# Patient Record
Sex: Female | Born: 1945 | Race: White | Hispanic: No | Marital: Married | Smoking: Former smoker
Health system: Southern US, Community
[De-identification: ages and names within clinical notes are randomized; demographics above are authoritative.]

## PROBLEM LIST (undated history)

## (undated) DIAGNOSIS — E785 Hyperlipidemia, unspecified: Secondary | ICD-10-CM

## (undated) DIAGNOSIS — C3412 Malignant neoplasm of upper lobe, left bronchus or lung: Secondary | ICD-10-CM

## (undated) DIAGNOSIS — J449 Chronic obstructive pulmonary disease, unspecified: Secondary | ICD-10-CM

## (undated) DIAGNOSIS — N2889 Other specified disorders of kidney and ureter: Secondary | ICD-10-CM

## (undated) DIAGNOSIS — Z85118 Personal history of other malignant neoplasm of bronchus and lung: Secondary | ICD-10-CM

## (undated) DIAGNOSIS — N182 Chronic kidney disease, stage 2 (mild): Secondary | ICD-10-CM

## (undated) HISTORY — DX: Chronic obstructive pulmonary disease, unspecified: J44.9

## (undated) HISTORY — DX: Hyperlipidemia, unspecified: E78.5

## (undated) HISTORY — DX: Malignant neoplasm of upper lobe, left bronchus or lung: C34.12

## (undated) HISTORY — DX: Other specified disorders of kidney and ureter: N28.89

## (undated) HISTORY — DX: Personal history of other malignant neoplasm of bronchus and lung: Z85.118

## (undated) HISTORY — DX: Chronic kidney disease, stage 2 (mild): N18.2

## (undated) HISTORY — PX: TUBAL LIGATION: SHX77

---

## 2006-05-15 DIAGNOSIS — Z85118 Personal history of other malignant neoplasm of bronchus and lung: Secondary | ICD-10-CM

## 2006-05-15 HISTORY — DX: Personal history of other malignant neoplasm of bronchus and lung: Z85.118

## 2006-05-15 HISTORY — PX: MEDIASTINOSCOPY: SHX5086

## 2007-02-13 ENCOUNTER — Ambulatory Visit (HOSPITAL_COMMUNITY): Admission: RE | Admit: 2007-02-13 | Discharge: 2007-02-13 | Payer: Self-pay | Admitting: Family Medicine

## 2007-02-14 ENCOUNTER — Ambulatory Visit (HOSPITAL_COMMUNITY): Admission: RE | Admit: 2007-02-14 | Discharge: 2007-02-14 | Payer: Self-pay | Admitting: Family Medicine

## 2007-02-21 ENCOUNTER — Ambulatory Visit: Payer: Self-pay | Admitting: Thoracic Surgery

## 2007-02-25 ENCOUNTER — Encounter: Payer: Self-pay | Admitting: Thoracic Surgery

## 2007-02-26 ENCOUNTER — Ambulatory Visit: Payer: Self-pay | Admitting: Thoracic Surgery

## 2007-02-26 ENCOUNTER — Ambulatory Visit: Payer: Self-pay | Admitting: Pulmonary Disease

## 2007-02-26 ENCOUNTER — Inpatient Hospital Stay (HOSPITAL_COMMUNITY): Admission: RE | Admit: 2007-02-26 | Discharge: 2007-03-01 | Payer: Self-pay | Admitting: Thoracic Surgery

## 2007-02-26 ENCOUNTER — Ambulatory Visit: Payer: Self-pay | Admitting: Internal Medicine

## 2007-02-27 ENCOUNTER — Encounter: Payer: Self-pay | Admitting: Thoracic Surgery

## 2007-02-28 ENCOUNTER — Ambulatory Visit: Payer: Self-pay | Admitting: Internal Medicine

## 2007-03-04 ENCOUNTER — Ambulatory Visit: Admission: RE | Admit: 2007-03-04 | Discharge: 2007-05-07 | Payer: Self-pay | Admitting: Radiation Oncology

## 2007-03-04 LAB — COMPREHENSIVE METABOLIC PANEL
Albumin: 4.1 g/dL (ref 3.5–5.2)
Alkaline Phosphatase: 73 U/L (ref 39–117)
BUN: 15 mg/dL (ref 6–23)
Calcium: 10.1 mg/dL (ref 8.4–10.5)
Glucose, Bld: 95 mg/dL (ref 70–99)
Potassium: 4.6 mEq/L (ref 3.5–5.3)

## 2007-03-04 LAB — CBC WITH DIFFERENTIAL/PLATELET
Basophils Absolute: 0 10*3/uL (ref 0.0–0.1)
Eosinophils Absolute: 0 10*3/uL (ref 0.0–0.5)
HCT: 44 % (ref 34.8–46.6)
HGB: 15.8 g/dL (ref 11.6–15.9)
MCV: 87.8 fL (ref 81.0–101.0)
MONO%: 5.3 % (ref 0.0–13.0)
NEUT#: 10.4 10*3/uL — ABNORMAL HIGH (ref 1.5–6.5)
Platelets: 242 10*3/uL (ref 145–400)
RDW: 10.8 % — ABNORMAL LOW (ref 11.3–14.5)

## 2007-03-11 LAB — COMPREHENSIVE METABOLIC PANEL
Albumin: 3.9 g/dL (ref 3.5–5.2)
BUN: 18 mg/dL (ref 6–23)
CO2: 21 mEq/L (ref 19–32)
Calcium: 9.1 mg/dL (ref 8.4–10.5)
Chloride: 100 mEq/L (ref 96–112)
Creatinine, Ser: 0.75 mg/dL (ref 0.40–1.20)
Glucose, Bld: 102 mg/dL — ABNORMAL HIGH (ref 70–99)
Potassium: 4.3 mEq/L (ref 3.5–5.3)

## 2007-03-11 LAB — CBC WITH DIFFERENTIAL/PLATELET
Basophils Absolute: 0 10*3/uL (ref 0.0–0.1)
Eosinophils Absolute: 0 10*3/uL (ref 0.0–0.5)
MCH: 31.1 pg (ref 26.0–34.0)
MCV: 87 fL (ref 81.0–101.0)
NEUT%: 91.6 % — ABNORMAL HIGH (ref 39.6–76.8)
Platelets: 220 10*3/uL (ref 145–400)
RDW: 10.9 % — ABNORMAL LOW (ref 11.3–14.5)
WBC: 7.2 10*3/uL (ref 3.9–10.0)
lymph#: 0.2 10*3/uL — ABNORMAL LOW (ref 0.9–3.3)

## 2007-03-18 LAB — COMPREHENSIVE METABOLIC PANEL
AST: 29 U/L (ref 0–37)
Albumin: 3.6 g/dL (ref 3.5–5.2)
BUN: 16 mg/dL (ref 6–23)
CO2: 22 mEq/L (ref 19–32)
Calcium: 8.9 mg/dL (ref 8.4–10.5)
Chloride: 105 mEq/L (ref 96–112)
Potassium: 3.7 mEq/L (ref 3.5–5.3)

## 2007-03-18 LAB — CBC WITH DIFFERENTIAL/PLATELET
Basophils Absolute: 0 10*3/uL (ref 0.0–0.1)
EOS%: 0.6 % (ref 0.0–7.0)
Eosinophils Absolute: 0 10*3/uL (ref 0.0–0.5)
HCT: 37.6 % (ref 34.8–46.6)
HGB: 13.2 g/dL (ref 11.6–15.9)
MCH: 31.1 pg (ref 26.0–34.0)
MONO#: 0.2 10*3/uL (ref 0.1–0.9)
NEUT#: 2.7 10*3/uL (ref 1.5–6.5)
NEUT%: 86.6 % — ABNORMAL HIGH (ref 39.6–76.8)
RDW: 11.7 % (ref 11.3–14.5)
WBC: 3.1 10*3/uL — ABNORMAL LOW (ref 3.9–10.0)
lymph#: 0.1 10*3/uL — ABNORMAL LOW (ref 0.9–3.3)

## 2007-03-25 LAB — CBC WITH DIFFERENTIAL/PLATELET
BASO%: 0.7 % (ref 0.0–2.0)
Basophils Absolute: 0 10*3/uL (ref 0.0–0.1)
EOS%: 0.7 % (ref 0.0–7.0)
HGB: 12.9 g/dL (ref 11.6–15.9)
MCH: 31.1 pg (ref 26.0–34.0)
MCHC: 36.2 g/dL — ABNORMAL HIGH (ref 32.0–36.0)
MONO#: 0.4 10*3/uL (ref 0.1–0.9)
RDW: 12.1 % (ref 11.3–14.5)
WBC: 2.9 10*3/uL — ABNORMAL LOW (ref 3.9–10.0)
lymph#: 0.4 10*3/uL — ABNORMAL LOW (ref 0.9–3.3)

## 2007-03-25 LAB — COMPREHENSIVE METABOLIC PANEL
ALT: 70 U/L — ABNORMAL HIGH (ref 0–35)
AST: 46 U/L — ABNORMAL HIGH (ref 0–37)
Albumin: 3.9 g/dL (ref 3.5–5.2)
Calcium: 9 mg/dL (ref 8.4–10.5)
Chloride: 101 mEq/L (ref 96–112)
Potassium: 4.3 mEq/L (ref 3.5–5.3)
Total Protein: 6.1 g/dL (ref 6.0–8.3)

## 2007-04-01 LAB — CBC WITH DIFFERENTIAL/PLATELET
EOS%: 0.6 % (ref 0.0–7.0)
MCH: 31.6 pg (ref 26.0–34.0)
MCV: 87.5 fL (ref 81.0–101.0)
MONO%: 9.6 % (ref 0.0–13.0)
RBC: 3.66 10*6/uL — ABNORMAL LOW (ref 3.70–5.32)
RDW: 14.7 % — ABNORMAL HIGH (ref 11.3–14.5)

## 2007-04-01 LAB — COMPREHENSIVE METABOLIC PANEL
AST: 50 U/L — ABNORMAL HIGH (ref 0–37)
Albumin: 3.8 g/dL (ref 3.5–5.2)
Alkaline Phosphatase: 61 U/L (ref 39–117)
BUN: 7 mg/dL (ref 6–23)
Potassium: 3.9 mEq/L (ref 3.5–5.3)
Sodium: 140 mEq/L (ref 135–145)
Total Bilirubin: 0.5 mg/dL (ref 0.3–1.2)
Total Protein: 6 g/dL (ref 6.0–8.3)

## 2007-04-02 ENCOUNTER — Ambulatory Visit: Payer: Self-pay | Admitting: Thoracic Surgery

## 2007-04-08 ENCOUNTER — Ambulatory Visit: Payer: Self-pay | Admitting: Internal Medicine

## 2007-04-08 ENCOUNTER — Encounter: Payer: Self-pay | Admitting: Critical Care Medicine

## 2007-04-08 LAB — CBC WITH DIFFERENTIAL/PLATELET
Basophils Absolute: 0 10*3/uL (ref 0.0–0.1)
EOS%: 0.1 % (ref 0.0–7.0)
HGB: 13.2 g/dL (ref 11.6–15.9)
LYMPH%: 14.3 % (ref 14.0–48.0)
MCH: 31.9 pg (ref 26.0–34.0)
MCV: 86.9 fL (ref 81.0–101.0)
MONO%: 11.1 % (ref 0.0–13.0)
Platelets: 224 10*3/uL (ref 145–400)
RDW: 14.4 % (ref 11.3–14.5)

## 2007-04-08 LAB — COMPREHENSIVE METABOLIC PANEL
AST: 56 U/L — ABNORMAL HIGH (ref 0–37)
Albumin: 4.4 g/dL (ref 3.5–5.2)
Alkaline Phosphatase: 77 U/L (ref 39–117)
BUN: 10 mg/dL (ref 6–23)
Creatinine, Ser: 0.63 mg/dL (ref 0.40–1.20)
Glucose, Bld: 104 mg/dL — ABNORMAL HIGH (ref 70–99)
Potassium: 4.6 mEq/L (ref 3.5–5.3)
Total Bilirubin: 0.9 mg/dL (ref 0.3–1.2)

## 2007-04-21 ENCOUNTER — Emergency Department (HOSPITAL_COMMUNITY): Admission: EM | Admit: 2007-04-21 | Discharge: 2007-04-21 | Payer: Self-pay | Admitting: Emergency Medicine

## 2007-04-30 ENCOUNTER — Encounter: Payer: Self-pay | Admitting: Critical Care Medicine

## 2007-04-30 LAB — CBC WITH DIFFERENTIAL/PLATELET
Basophils Absolute: 0 10*3/uL (ref 0.0–0.1)
EOS%: 1.4 % (ref 0.0–7.0)
Eosinophils Absolute: 0 10*3/uL (ref 0.0–0.5)
HCT: 32.6 % — ABNORMAL LOW (ref 34.8–46.6)
HGB: 11.2 g/dL — ABNORMAL LOW (ref 11.6–15.9)
LYMPH%: 19.3 % (ref 14.0–48.0)
MCH: 31.5 pg (ref 26.0–34.0)
MCV: 91.5 fL (ref 81.0–101.0)
MONO%: 10.4 % (ref 0.0–13.0)
NEUT#: 2.3 10*3/uL (ref 1.5–6.5)
NEUT%: 68.3 % (ref 39.6–76.8)
Platelets: 108 10*3/uL — ABNORMAL LOW (ref 145–400)

## 2007-05-06 ENCOUNTER — Ambulatory Visit (HOSPITAL_COMMUNITY): Admission: RE | Admit: 2007-05-06 | Discharge: 2007-05-06 | Payer: Self-pay | Admitting: Internal Medicine

## 2007-05-13 ENCOUNTER — Encounter: Payer: Self-pay | Admitting: Critical Care Medicine

## 2007-05-13 LAB — CBC WITH DIFFERENTIAL/PLATELET
Basophils Absolute: 0 10*3/uL (ref 0.0–0.1)
Eosinophils Absolute: 0 10*3/uL (ref 0.0–0.5)
LYMPH%: 7.6 % — ABNORMAL LOW (ref 14.0–48.0)
MCV: 90.6 fL (ref 81.0–101.0)
MONO%: 7.7 % (ref 0.0–13.0)
NEUT#: 3 10*3/uL (ref 1.5–6.5)
Platelets: 208 10*3/uL (ref 145–400)
RBC: 3.26 10*6/uL — ABNORMAL LOW (ref 3.70–5.32)

## 2007-05-13 LAB — COMPREHENSIVE METABOLIC PANEL
Alkaline Phosphatase: 124 U/L — ABNORMAL HIGH (ref 39–117)
BUN: 14 mg/dL (ref 6–23)
Creatinine, Ser: 0.63 mg/dL (ref 0.40–1.20)
Glucose, Bld: 139 mg/dL — ABNORMAL HIGH (ref 70–99)
Sodium: 135 mEq/L (ref 135–145)
Total Bilirubin: 0.7 mg/dL (ref 0.3–1.2)

## 2007-05-23 ENCOUNTER — Ambulatory Visit: Payer: Self-pay | Admitting: Internal Medicine

## 2007-05-27 ENCOUNTER — Encounter: Payer: Self-pay | Admitting: Critical Care Medicine

## 2007-05-27 LAB — CBC WITH DIFFERENTIAL/PLATELET
Basophils Absolute: 0 10*3/uL (ref 0.0–0.1)
Eosinophils Absolute: 0 10*3/uL (ref 0.0–0.5)
HGB: 9.5 g/dL — ABNORMAL LOW (ref 11.6–15.9)
LYMPH%: 6.7 % — ABNORMAL LOW (ref 14.0–48.0)
MCV: 87.5 fL (ref 81.0–101.0)
MONO%: 3.7 % (ref 0.0–13.0)
NEUT#: 8.1 10*3/uL — ABNORMAL HIGH (ref 1.5–6.5)
Platelets: 407 10*3/uL — ABNORMAL HIGH (ref 145–400)

## 2007-05-27 LAB — COMPREHENSIVE METABOLIC PANEL
Albumin: 3.3 g/dL — ABNORMAL LOW (ref 3.5–5.2)
Alkaline Phosphatase: 91 U/L (ref 39–117)
BUN: 11 mg/dL (ref 6–23)
CO2: 21 mEq/L (ref 19–32)
Glucose, Bld: 125 mg/dL — ABNORMAL HIGH (ref 70–99)
Potassium: 4.1 mEq/L (ref 3.5–5.3)
Total Bilirubin: 0.4 mg/dL (ref 0.3–1.2)

## 2007-06-03 LAB — COMPREHENSIVE METABOLIC PANEL
AST: 14 U/L (ref 0–37)
Alkaline Phosphatase: 104 U/L (ref 39–117)
BUN: 11 mg/dL (ref 6–23)
Creatinine, Ser: 0.67 mg/dL (ref 0.40–1.20)
Glucose, Bld: 101 mg/dL — ABNORMAL HIGH (ref 70–99)
Total Bilirubin: 0.3 mg/dL (ref 0.3–1.2)

## 2007-06-03 LAB — CBC WITH DIFFERENTIAL/PLATELET
Basophils Absolute: 0.5 10*3/uL — ABNORMAL HIGH (ref 0.0–0.1)
Eosinophils Absolute: 0 10*3/uL (ref 0.0–0.5)
HGB: 10.1 g/dL — ABNORMAL LOW (ref 11.6–15.9)
MCV: 87.4 fL (ref 81.0–101.0)
MONO#: 1.3 10*3/uL — ABNORMAL HIGH (ref 0.1–0.9)
MONO%: 9.7 % (ref 0.0–13.0)
NEUT#: 9.5 10*3/uL — ABNORMAL HIGH (ref 1.5–6.5)
RDW: 13.1 % (ref 11.3–14.5)
WBC: 12.9 10*3/uL — ABNORMAL HIGH (ref 3.9–10.0)

## 2007-06-03 LAB — TECHNOLOGIST REVIEW

## 2007-06-06 LAB — CBC WITH DIFFERENTIAL/PLATELET
Basophils Absolute: 0.1 10*3/uL (ref 0.0–0.1)
EOS%: 0.1 % (ref 0.0–7.0)
Eosinophils Absolute: 0 10*3/uL (ref 0.0–0.5)
HCT: 29.3 % — ABNORMAL LOW (ref 34.8–46.6)
HGB: 9.6 g/dL — ABNORMAL LOW (ref 11.6–15.9)
MCH: 28.5 pg (ref 26.0–34.0)
MCV: 87.3 fL (ref 81.0–101.0)
MONO%: 7 % (ref 0.0–13.0)
NEUT#: 13 10*3/uL — ABNORMAL HIGH (ref 1.5–6.5)
NEUT%: 86.3 % — ABNORMAL HIGH (ref 39.6–76.8)
RDW: 13 % (ref 11.3–14.5)

## 2007-06-10 LAB — CBC WITH DIFFERENTIAL/PLATELET
Basophils Absolute: 0.1 10*3/uL (ref 0.0–0.1)
EOS%: 0.9 % (ref 0.0–7.0)
Eosinophils Absolute: 0.1 10*3/uL (ref 0.0–0.5)
HGB: 9 g/dL — ABNORMAL LOW (ref 11.6–15.9)
LYMPH%: 10.4 % — ABNORMAL LOW (ref 14.0–48.0)
MCH: 29.3 pg (ref 26.0–34.0)
MCV: 86.6 fL (ref 81.0–101.0)
MONO%: 4.8 % (ref 0.0–13.0)
NEUT#: 5 10*3/uL (ref 1.5–6.5)
Platelets: 234 10*3/uL (ref 145–400)
RBC: 3.09 10*6/uL — ABNORMAL LOW (ref 3.70–5.32)
RDW: 15.7 % — ABNORMAL HIGH (ref 11.3–14.5)

## 2007-06-10 LAB — COMPREHENSIVE METABOLIC PANEL
AST: 10 U/L (ref 0–37)
Alkaline Phosphatase: 109 U/L (ref 39–117)
BUN: 12 mg/dL (ref 6–23)
Glucose, Bld: 124 mg/dL — ABNORMAL HIGH (ref 70–99)
Potassium: 3.4 mEq/L — ABNORMAL LOW (ref 3.5–5.3)
Total Bilirubin: 0.3 mg/dL (ref 0.3–1.2)

## 2007-06-11 ENCOUNTER — Encounter: Payer: Self-pay | Admitting: Critical Care Medicine

## 2007-06-11 LAB — CBC WITH DIFFERENTIAL/PLATELET
Basophils Absolute: 0 10*3/uL (ref 0.0–0.1)
EOS%: 1 % (ref 0.0–7.0)
Eosinophils Absolute: 0.1 10*3/uL (ref 0.0–0.5)
HCT: 29.9 % — ABNORMAL LOW (ref 34.8–46.6)
HGB: 10 g/dL — ABNORMAL LOW (ref 11.6–15.9)
LYMPH%: 12.1 % — ABNORMAL LOW (ref 14.0–48.0)
MCH: 29.2 pg (ref 26.0–34.0)
MCV: 87.6 fL (ref 81.0–101.0)
MONO%: 7.5 % (ref 0.0–13.0)
NEUT#: 5.3 10*3/uL (ref 1.5–6.5)
NEUT%: 78.9 % — ABNORMAL HIGH (ref 39.6–76.8)
Platelets: 243 10*3/uL (ref 145–400)
RDW: 13.5 % (ref 11.3–14.5)

## 2007-06-17 ENCOUNTER — Encounter: Payer: Self-pay | Admitting: Critical Care Medicine

## 2007-06-17 LAB — CBC WITH DIFFERENTIAL/PLATELET
Basophils Absolute: 0 10*3/uL (ref 0.0–0.1)
EOS%: 0 % (ref 0.0–7.0)
HCT: 28.8 % — ABNORMAL LOW (ref 34.8–46.6)
HGB: 9.3 g/dL — ABNORMAL LOW (ref 11.6–15.9)
MCH: 27.4 pg (ref 26.0–34.0)
MCV: 85.2 fL (ref 81.0–101.0)
MONO%: 4.8 % (ref 0.0–13.0)
NEUT%: 89.1 % — ABNORMAL HIGH (ref 39.6–76.8)

## 2007-06-24 ENCOUNTER — Encounter: Payer: Self-pay | Admitting: Critical Care Medicine

## 2007-06-24 LAB — CBC WITH DIFFERENTIAL/PLATELET
Basophils Absolute: 0 10*3/uL (ref 0.0–0.1)
EOS%: 0.1 % (ref 0.0–7.0)
MCH: 28.5 pg (ref 26.0–34.0)
MCV: 85.2 fL (ref 81.0–101.0)
MONO%: 2.1 % (ref 0.0–13.0)
RBC: 3.62 10*6/uL — ABNORMAL LOW (ref 3.70–5.32)
RDW: 13.7 % (ref 11.3–14.5)

## 2007-06-24 LAB — COMPREHENSIVE METABOLIC PANEL
AST: 14 U/L (ref 0–37)
Albumin: 4 g/dL (ref 3.5–5.2)
Alkaline Phosphatase: 91 U/L (ref 39–117)
BUN: 18 mg/dL (ref 6–23)
Potassium: 4.9 mEq/L (ref 3.5–5.3)
Sodium: 136 mEq/L (ref 135–145)
Total Bilirubin: 0.3 mg/dL (ref 0.3–1.2)

## 2007-07-01 LAB — CBC WITH DIFFERENTIAL/PLATELET
Basophils Absolute: 0 10*3/uL (ref 0.0–0.1)
EOS%: 0.8 % (ref 0.0–7.0)
HCT: 34.3 % — ABNORMAL LOW (ref 34.8–46.6)
HGB: 10.7 g/dL — ABNORMAL LOW (ref 11.6–15.9)
LYMPH%: 9.8 % — ABNORMAL LOW (ref 14.0–48.0)
MCH: 26.5 pg (ref 26.0–34.0)
MCV: 84.6 fL (ref 81.0–101.0)
MONO%: 8.3 % (ref 0.0–13.0)
NEUT%: 81 % — ABNORMAL HIGH (ref 39.6–76.8)
Platelets: 205 10*3/uL (ref 145–400)
RDW: 16.6 % — ABNORMAL HIGH (ref 11.3–14.5)

## 2007-07-01 LAB — COMPREHENSIVE METABOLIC PANEL
AST: 12 U/L (ref 0–37)
Alkaline Phosphatase: 110 U/L (ref 39–117)
BUN: 9 mg/dL (ref 6–23)
Creatinine, Ser: 0.63 mg/dL (ref 0.40–1.20)
Glucose, Bld: 110 mg/dL — ABNORMAL HIGH (ref 70–99)
Total Bilirubin: 0.4 mg/dL (ref 0.3–1.2)

## 2007-07-03 ENCOUNTER — Ambulatory Visit: Payer: Self-pay | Admitting: Internal Medicine

## 2007-07-08 LAB — COMPREHENSIVE METABOLIC PANEL
ALT: 9 U/L (ref 0–35)
AST: 10 U/L (ref 0–37)
Alkaline Phosphatase: 117 U/L (ref 39–117)
CO2: 22 mEq/L (ref 19–32)
Sodium: 135 mEq/L (ref 135–145)
Total Bilirubin: 0.4 mg/dL (ref 0.3–1.2)
Total Protein: 5.5 g/dL — ABNORMAL LOW (ref 6.0–8.3)

## 2007-07-08 LAB — CBC WITH DIFFERENTIAL/PLATELET
BASO%: 0 % (ref 0.0–2.0)
EOS%: 0.5 % (ref 0.0–7.0)
LYMPH%: 6.2 % — ABNORMAL LOW (ref 14.0–48.0)
MCH: 27.7 pg (ref 26.0–34.0)
MCHC: 33.2 g/dL (ref 32.0–36.0)
MONO#: 0.3 10*3/uL (ref 0.1–0.9)
Platelets: 248 10*3/uL (ref 145–400)
RBC: 3.63 10*6/uL — ABNORMAL LOW (ref 3.70–5.32)
WBC: 9.1 10*3/uL (ref 3.9–10.0)

## 2007-07-11 ENCOUNTER — Ambulatory Visit (HOSPITAL_COMMUNITY): Admission: RE | Admit: 2007-07-11 | Discharge: 2007-07-11 | Payer: Self-pay | Admitting: Oncology

## 2007-07-11 ENCOUNTER — Encounter: Payer: Self-pay | Admitting: Critical Care Medicine

## 2007-07-11 LAB — CBC WITH DIFFERENTIAL/PLATELET
Basophils Absolute: 0 10*3/uL (ref 0.0–0.1)
Eosinophils Absolute: 0.1 10*3/uL (ref 0.0–0.5)
HGB: 10.3 g/dL — ABNORMAL LOW (ref 11.6–15.9)
MONO#: 0.5 10*3/uL (ref 0.1–0.9)
RDW: 17 % — ABNORMAL HIGH (ref 11.3–14.5)
WBC: 7.8 10*3/uL (ref 3.9–10.0)
lymph#: 0.5 10*3/uL — ABNORMAL LOW (ref 0.9–3.3)

## 2007-07-15 ENCOUNTER — Encounter: Payer: Self-pay | Admitting: Critical Care Medicine

## 2007-07-15 ENCOUNTER — Ambulatory Visit (HOSPITAL_COMMUNITY): Admission: RE | Admit: 2007-07-15 | Discharge: 2007-07-15 | Payer: Self-pay | Admitting: Internal Medicine

## 2007-07-15 LAB — COMPREHENSIVE METABOLIC PANEL
ALT: 11 U/L (ref 0–35)
CO2: 21 mEq/L (ref 19–32)
Calcium: 8.7 mg/dL (ref 8.4–10.5)
Chloride: 100 mEq/L (ref 96–112)
Creatinine, Ser: 0.62 mg/dL (ref 0.40–1.20)
Sodium: 132 mEq/L — ABNORMAL LOW (ref 135–145)
Total Protein: 5.9 g/dL — ABNORMAL LOW (ref 6.0–8.3)

## 2007-07-15 LAB — CBC WITH DIFFERENTIAL/PLATELET
BASO%: 0.2 % (ref 0.0–2.0)
MCHC: 33 g/dL (ref 32.0–36.0)
MONO#: 0.4 10*3/uL (ref 0.1–0.9)
RBC: 4.02 10*6/uL (ref 3.70–5.32)
RDW: 15.9 % — ABNORMAL HIGH (ref 11.3–14.5)
WBC: 12.5 10*3/uL — ABNORMAL HIGH (ref 3.9–10.0)
lymph#: 0.5 10*3/uL — ABNORMAL LOW (ref 0.9–3.3)

## 2007-08-02 ENCOUNTER — Ambulatory Visit (HOSPITAL_COMMUNITY): Admission: RE | Admit: 2007-08-02 | Discharge: 2007-08-02 | Payer: Self-pay | Admitting: Internal Medicine

## 2007-08-05 ENCOUNTER — Encounter: Payer: Self-pay | Admitting: Critical Care Medicine

## 2007-08-05 LAB — CBC WITH DIFFERENTIAL/PLATELET
BASO%: 0.6 % (ref 0.0–2.0)
Basophils Absolute: 0 10*3/uL (ref 0.0–0.1)
HCT: 30.5 % — ABNORMAL LOW (ref 34.8–46.6)
HGB: 10.4 g/dL — ABNORMAL LOW (ref 11.6–15.9)
MCHC: 34 g/dL (ref 32.0–36.0)
MONO#: 0.5 10*3/uL (ref 0.1–0.9)
NEUT%: 70.9 % (ref 39.6–76.8)
WBC: 7 10*3/uL (ref 3.9–10.0)
lymph#: 0.8 10*3/uL — ABNORMAL LOW (ref 0.9–3.3)

## 2007-08-05 LAB — COMPREHENSIVE METABOLIC PANEL
AST: 13 U/L (ref 0–37)
Alkaline Phosphatase: 91 U/L (ref 39–117)
BUN: 8 mg/dL (ref 6–23)
Creatinine, Ser: 0.66 mg/dL (ref 0.40–1.20)
Potassium: 3.9 mEq/L (ref 3.5–5.3)

## 2007-10-24 ENCOUNTER — Ambulatory Visit: Payer: Self-pay | Admitting: Internal Medicine

## 2007-10-29 LAB — CBC WITH DIFFERENTIAL/PLATELET
Basophils Absolute: 0.1 10*3/uL (ref 0.0–0.1)
EOS%: 3.8 % (ref 0.0–7.0)
HCT: 40 % (ref 34.8–46.6)
HGB: 13.3 g/dL (ref 11.6–15.9)
LYMPH%: 22.3 % (ref 14.0–48.0)
MCH: 27.2 pg (ref 26.0–34.0)
MCV: 81.6 fL (ref 81.0–101.0)
NEUT%: 64.9 % (ref 39.6–76.8)
Platelets: 314 10*3/uL (ref 145–400)
lymph#: 1.5 10*3/uL (ref 0.9–3.3)

## 2007-10-29 LAB — COMPREHENSIVE METABOLIC PANEL
AST: 16 U/L (ref 0–37)
BUN: 13 mg/dL (ref 6–23)
Calcium: 9.8 mg/dL (ref 8.4–10.5)
Chloride: 104 mEq/L (ref 96–112)
Creatinine, Ser: 0.76 mg/dL (ref 0.40–1.20)

## 2007-10-31 ENCOUNTER — Ambulatory Visit (HOSPITAL_COMMUNITY): Admission: RE | Admit: 2007-10-31 | Discharge: 2007-10-31 | Payer: Self-pay | Admitting: Internal Medicine

## 2007-11-05 ENCOUNTER — Ambulatory Visit: Payer: Self-pay | Admitting: Internal Medicine

## 2007-11-05 DIAGNOSIS — R0602 Shortness of breath: Secondary | ICD-10-CM | POA: Insufficient documentation

## 2007-11-05 DIAGNOSIS — J701 Chronic and other pulmonary manifestations due to radiation: Secondary | ICD-10-CM

## 2007-11-05 DIAGNOSIS — R0789 Other chest pain: Secondary | ICD-10-CM

## 2007-11-11 ENCOUNTER — Telehealth: Payer: Self-pay | Admitting: Internal Medicine

## 2007-11-20 ENCOUNTER — Encounter: Payer: Self-pay | Admitting: Internal Medicine

## 2007-11-20 ENCOUNTER — Encounter (HOSPITAL_COMMUNITY): Admission: RE | Admit: 2007-11-20 | Discharge: 2007-12-20 | Payer: Self-pay | Admitting: Internal Medicine

## 2007-12-21 ENCOUNTER — Encounter (HOSPITAL_COMMUNITY): Admission: RE | Admit: 2007-12-21 | Discharge: 2008-01-20 | Payer: Self-pay | Admitting: Internal Medicine

## 2007-12-27 ENCOUNTER — Encounter: Payer: Self-pay | Admitting: Internal Medicine

## 2008-01-07 ENCOUNTER — Ambulatory Visit: Payer: Self-pay | Admitting: Internal Medicine

## 2008-01-07 DIAGNOSIS — J4489 Other specified chronic obstructive pulmonary disease: Secondary | ICD-10-CM | POA: Insufficient documentation

## 2008-01-07 DIAGNOSIS — J449 Chronic obstructive pulmonary disease, unspecified: Secondary | ICD-10-CM

## 2008-01-10 ENCOUNTER — Ambulatory Visit: Payer: Self-pay | Admitting: Internal Medicine

## 2008-01-14 ENCOUNTER — Ambulatory Visit (HOSPITAL_COMMUNITY): Admission: RE | Admit: 2008-01-14 | Discharge: 2008-01-14 | Payer: Self-pay | Admitting: Internal Medicine

## 2008-01-14 LAB — COMPREHENSIVE METABOLIC PANEL
Alkaline Phosphatase: 104 U/L (ref 39–117)
BUN: 14 mg/dL (ref 6–23)
Creatinine, Ser: 0.88 mg/dL (ref 0.40–1.20)
Glucose, Bld: 89 mg/dL (ref 70–99)
Total Bilirubin: 0.4 mg/dL (ref 0.3–1.2)

## 2008-01-14 LAB — CBC WITH DIFFERENTIAL/PLATELET
Basophils Absolute: 0 10*3/uL (ref 0.0–0.1)
Eosinophils Absolute: 0.2 10*3/uL (ref 0.0–0.5)
HCT: 38.2 % (ref 34.8–46.6)
HGB: 12.9 g/dL (ref 11.6–15.9)
LYMPH%: 17 % (ref 14.0–48.0)
MCV: 87.1 fL (ref 81.0–101.0)
MONO%: 9.4 % (ref 0.0–13.0)
NEUT#: 2.5 10*3/uL (ref 1.5–6.5)
NEUT%: 66.2 % (ref 39.6–76.8)
Platelets: 184 10*3/uL (ref 145–400)
RBC: 4.38 10*6/uL (ref 3.70–5.32)

## 2008-01-22 ENCOUNTER — Encounter (HOSPITAL_COMMUNITY): Admission: RE | Admit: 2008-01-22 | Discharge: 2008-02-10 | Payer: Self-pay | Admitting: Internal Medicine

## 2008-01-28 ENCOUNTER — Encounter: Payer: Self-pay | Admitting: Internal Medicine

## 2008-03-03 ENCOUNTER — Encounter: Payer: Self-pay | Admitting: Internal Medicine

## 2008-05-19 ENCOUNTER — Ambulatory Visit: Payer: Self-pay | Admitting: Internal Medicine

## 2008-05-21 ENCOUNTER — Ambulatory Visit (HOSPITAL_COMMUNITY): Admission: RE | Admit: 2008-05-21 | Discharge: 2008-05-21 | Payer: Self-pay | Admitting: Internal Medicine

## 2008-05-21 LAB — CBC WITH DIFFERENTIAL/PLATELET
BASO%: 0.6 % (ref 0.0–2.0)
Eosinophils Absolute: 0.1 10*3/uL (ref 0.0–0.5)
LYMPH%: 16.9 % (ref 14.0–48.0)
MCHC: 34.1 g/dL (ref 32.0–36.0)
MCV: 87.1 fL (ref 81.0–101.0)
MONO%: 8 % (ref 0.0–13.0)
Platelets: 260 10*3/uL (ref 145–400)
RBC: 4.3 10*6/uL (ref 3.70–5.32)

## 2008-05-21 LAB — COMPREHENSIVE METABOLIC PANEL
Alkaline Phosphatase: 96 U/L (ref 39–117)
Glucose, Bld: 96 mg/dL (ref 70–99)
Sodium: 140 mEq/L (ref 135–145)
Total Bilirubin: 0.7 mg/dL (ref 0.3–1.2)
Total Protein: 6.9 g/dL (ref 6.0–8.3)

## 2008-05-28 ENCOUNTER — Encounter: Payer: Self-pay | Admitting: Internal Medicine

## 2008-09-15 ENCOUNTER — Ambulatory Visit: Payer: Self-pay | Admitting: Internal Medicine

## 2008-09-17 ENCOUNTER — Ambulatory Visit (HOSPITAL_COMMUNITY): Admission: RE | Admit: 2008-09-17 | Discharge: 2008-09-17 | Payer: Self-pay | Admitting: Internal Medicine

## 2008-09-17 LAB — CBC WITH DIFFERENTIAL/PLATELET
BASO%: 0.3 % (ref 0.0–2.0)
LYMPH%: 26.4 % (ref 14.0–49.7)
MCHC: 34.2 g/dL (ref 31.5–36.0)
MCV: 87.1 fL (ref 79.5–101.0)
MONO%: 9.8 % (ref 0.0–14.0)
Platelets: 162 10*3/uL (ref 145–400)
RBC: 4.45 10*6/uL (ref 3.70–5.45)
WBC: 4 10*3/uL (ref 3.9–10.3)

## 2008-09-17 LAB — COMPREHENSIVE METABOLIC PANEL
ALT: 14 U/L (ref 0–35)
AST: 20 U/L (ref 0–37)
Alkaline Phosphatase: 89 U/L (ref 39–117)
Sodium: 140 mEq/L (ref 135–145)
Total Bilirubin: 0.8 mg/dL (ref 0.3–1.2)
Total Protein: 6.5 g/dL (ref 6.0–8.3)

## 2008-09-24 ENCOUNTER — Encounter: Payer: Self-pay | Admitting: Internal Medicine

## 2009-01-19 ENCOUNTER — Ambulatory Visit: Payer: Self-pay | Admitting: Internal Medicine

## 2009-01-21 ENCOUNTER — Ambulatory Visit (HOSPITAL_COMMUNITY): Admission: RE | Admit: 2009-01-21 | Discharge: 2009-01-21 | Payer: Self-pay | Admitting: Internal Medicine

## 2009-01-21 LAB — COMPREHENSIVE METABOLIC PANEL
Albumin: 3.8 g/dL (ref 3.5–5.2)
CO2: 30 mEq/L (ref 19–32)
Creatinine, Ser: 0.84 mg/dL (ref 0.40–1.20)
Potassium: 4.2 mEq/L (ref 3.5–5.3)
Sodium: 138 mEq/L (ref 135–145)
Total Bilirubin: 0.4 mg/dL (ref 0.3–1.2)

## 2009-01-21 LAB — CBC WITH DIFFERENTIAL/PLATELET
BASO%: 0.3 % (ref 0.0–2.0)
EOS%: 4.1 % (ref 0.0–7.0)
Eosinophils Absolute: 0.1 10*3/uL (ref 0.0–0.5)
HCT: 40.2 % (ref 34.8–46.6)
MONO#: 0.3 10*3/uL (ref 0.1–0.9)
MONO%: 9.5 % (ref 0.0–14.0)
NEUT#: 2.2 10*3/uL (ref 1.5–6.5)
lymph#: 0.8 10*3/uL — ABNORMAL LOW (ref 0.9–3.3)

## 2009-01-25 ENCOUNTER — Encounter: Payer: Self-pay | Admitting: Internal Medicine

## 2009-03-02 ENCOUNTER — Telehealth: Payer: Self-pay | Admitting: Internal Medicine

## 2009-07-20 ENCOUNTER — Ambulatory Visit: Payer: Self-pay | Admitting: Internal Medicine

## 2009-07-21 ENCOUNTER — Encounter: Payer: Self-pay | Admitting: Internal Medicine

## 2009-07-23 ENCOUNTER — Ambulatory Visit (HOSPITAL_COMMUNITY): Admission: RE | Admit: 2009-07-23 | Discharge: 2009-07-23 | Payer: Self-pay | Admitting: Internal Medicine

## 2009-07-23 LAB — CBC WITH DIFFERENTIAL/PLATELET
BASO%: 0.4 % (ref 0.0–2.0)
Basophils Absolute: 0 10e3/uL (ref 0.0–0.1)
EOS%: 5.6 % (ref 0.0–7.0)
Eosinophils Absolute: 0.2 10e3/uL (ref 0.0–0.5)
HCT: 41.9 % (ref 34.8–46.6)
HGB: 14.3 g/dL (ref 11.6–15.9)
LYMPH%: 20.3 % (ref 14.0–49.7)
MCH: 30.6 pg (ref 25.1–34.0)
MCHC: 34.2 g/dL (ref 31.5–36.0)
MCV: 89.5 fL (ref 79.5–101.0)
MONO#: 0.4 10e3/uL (ref 0.1–0.9)
MONO%: 11.2 % (ref 0.0–14.0)
NEUT#: 2.2 10e3/uL (ref 1.5–6.5)
NEUT%: 62.5 % (ref 38.4–76.8)
Platelets: 198 10e3/uL (ref 145–400)
RBC: 4.68 10e6/uL (ref 3.70–5.45)
RDW: 13.3 % (ref 11.2–14.5)
WBC: 3.5 10e3/uL — ABNORMAL LOW (ref 3.9–10.3)
lymph#: 0.7 10e3/uL — ABNORMAL LOW (ref 0.9–3.3)

## 2009-07-23 LAB — COMPREHENSIVE METABOLIC PANEL
ALT: 15 U/L (ref 0–35)
AST: 21 U/L (ref 0–37)
Albumin: 3.9 g/dL (ref 3.5–5.2)
Alkaline Phosphatase: 65 U/L (ref 39–117)
BUN: 13 mg/dL (ref 6–23)
CO2: 27 mEq/L (ref 19–32)
Total Bilirubin: 0.5 mg/dL (ref 0.3–1.2)

## 2010-01-19 ENCOUNTER — Ambulatory Visit (HOSPITAL_COMMUNITY): Admission: RE | Admit: 2010-01-19 | Discharge: 2010-01-19 | Payer: Self-pay | Admitting: Internal Medicine

## 2010-01-19 ENCOUNTER — Ambulatory Visit (HOSPITAL_BASED_OUTPATIENT_CLINIC_OR_DEPARTMENT_OTHER): Payer: Medicare Other | Admitting: Internal Medicine

## 2010-01-19 LAB — CBC WITH DIFFERENTIAL/PLATELET
BASO%: 0.4 % (ref 0.0–2.0)
EOS%: 7.7 % — ABNORMAL HIGH (ref 0.0–7.0)
Eosinophils Absolute: 0.3 10*3/uL (ref 0.0–0.5)
HGB: 14.2 g/dL (ref 11.6–15.9)
LYMPH%: 22.8 % (ref 14.0–49.7)
MCV: 86.8 fL (ref 79.5–101.0)
MONO#: 0.4 10*3/uL (ref 0.1–0.9)
NEUT#: 2.2 10*3/uL (ref 1.5–6.5)
Platelets: 177 10*3/uL (ref 145–400)
RDW: 13.6 % (ref 11.2–14.5)
WBC: 3.7 10*3/uL — ABNORMAL LOW (ref 3.9–10.3)

## 2010-01-19 LAB — COMPREHENSIVE METABOLIC PANEL
ALT: 15 U/L (ref 0–35)
Albumin: 4.1 g/dL (ref 3.5–5.2)
Alkaline Phosphatase: 81 U/L (ref 39–117)
CO2: 28 mEq/L (ref 19–32)
Creatinine, Ser: 0.87 mg/dL (ref 0.40–1.20)
Potassium: 4.1 mEq/L (ref 3.5–5.3)
Sodium: 142 mEq/L (ref 135–145)
Total Protein: 7.1 g/dL (ref 6.0–8.3)

## 2010-06-04 ENCOUNTER — Other Ambulatory Visit: Payer: Self-pay | Admitting: Internal Medicine

## 2010-06-04 DIAGNOSIS — C3490 Malignant neoplasm of unspecified part of unspecified bronchus or lung: Secondary | ICD-10-CM

## 2010-06-05 ENCOUNTER — Encounter: Payer: Self-pay | Admitting: Thoracic Surgery

## 2010-06-14 NOTE — Letter (Signed)
[facility]   Imported By: Sherian Rein 08/30/2009 10:13:30  _____________________________________________________________________  External Attachment:    Type:   Image     Comment:   External Document

## 2010-07-18 ENCOUNTER — Other Ambulatory Visit: Payer: Self-pay | Admitting: Internal Medicine

## 2010-07-18 DIAGNOSIS — H539 Unspecified visual disturbance: Secondary | ICD-10-CM

## 2010-07-18 DIAGNOSIS — R42 Dizziness and giddiness: Secondary | ICD-10-CM

## 2010-07-18 DIAGNOSIS — C3490 Malignant neoplasm of unspecified part of unspecified bronchus or lung: Secondary | ICD-10-CM

## 2010-07-20 ENCOUNTER — Other Ambulatory Visit (HOSPITAL_COMMUNITY): Payer: Self-pay

## 2010-07-21 ENCOUNTER — Encounter: Payer: Medicare Other | Admitting: Internal Medicine

## 2010-07-21 ENCOUNTER — Ambulatory Visit
Admission: RE | Admit: 2010-07-21 | Discharge: 2010-07-21 | Disposition: A | Discharge status: Home / Self Care | Payer: Medicare Other | Source: Ambulatory Visit | Attending: Internal Medicine | Admitting: Internal Medicine

## 2010-07-21 DIAGNOSIS — H539 Unspecified visual disturbance: Secondary | ICD-10-CM

## 2010-07-21 DIAGNOSIS — C3490 Malignant neoplasm of unspecified part of unspecified bronchus or lung: Secondary | ICD-10-CM

## 2010-07-21 DIAGNOSIS — R42 Dizziness and giddiness: Secondary | ICD-10-CM

## 2010-07-21 LAB — CBC WITH DIFFERENTIAL/PLATELET
Basophils Absolute: 0 10*3/uL (ref 0.0–0.1)
Eosinophils Absolute: 0.2 10*3/uL (ref 0.0–0.5)
LYMPH%: 11.8 % — ABNORMAL LOW (ref 14.0–49.7)
MCHC: 34.1 g/dL (ref 31.5–36.0)
MONO#: 0.4 10*3/uL (ref 0.1–0.9)
MONO%: 6.9 % (ref 0.0–14.0)
NEUT#: 5.1 10*3/uL (ref 1.5–6.5)
NEUT%: 78.8 % — ABNORMAL HIGH (ref 38.4–76.8)
Platelets: 168 10*3/uL (ref 145–400)
WBC: 6.5 10*3/uL (ref 3.9–10.3)
lymph#: 0.8 10*3/uL — ABNORMAL LOW (ref 0.9–3.3)

## 2010-07-21 LAB — COMPREHENSIVE METABOLIC PANEL
ALT: 12 U/L (ref 0–35)
AST: 19 U/L (ref 0–37)
Albumin: 4.6 g/dL (ref 3.5–5.2)
Alkaline Phosphatase: 84 U/L (ref 39–117)
CO2: 24 mEq/L (ref 19–32)
Glucose, Bld: 109 mg/dL — ABNORMAL HIGH (ref 70–99)
Potassium: 4.3 mEq/L (ref 3.5–5.3)
Total Bilirubin: 0.5 mg/dL (ref 0.3–1.2)
Total Protein: 6.6 g/dL (ref 6.0–8.3)

## 2010-07-21 MED ORDER — IOHEXOL 300 MG/ML  SOLN
75.0000 mL | Freq: Once | INTRAMUSCULAR | Status: AC | PRN
  Administered 2010-07-21: 75 mL via INTRAVENOUS

## 2010-07-21 MED ORDER — GADOBENATE DIMEGLUMINE 529 MG/ML IV SOLN
10.0000 mL | Freq: Once | INTRAVENOUS | Status: AC | PRN
  Administered 2010-07-21: 10 mL via INTRAVENOUS

## 2010-07-26 ENCOUNTER — Encounter (HOSPITAL_BASED_OUTPATIENT_CLINIC_OR_DEPARTMENT_OTHER): Payer: Medicare Other | Admitting: Internal Medicine

## 2010-07-26 ENCOUNTER — Other Ambulatory Visit: Payer: Self-pay | Admitting: Internal Medicine

## 2010-07-26 DIAGNOSIS — C3490 Malignant neoplasm of unspecified part of unspecified bronchus or lung: Secondary | ICD-10-CM

## 2010-09-27 NOTE — Assessment & Plan Note (Signed)
OFFICE VISIT   WARDA, MCQUEARY  DOB:  December 04, 1945                                        April 02, 2007  CHART #:  [id number]   SUBJECTIVE:  Ms. Kuehnel came for followup today.   PHYSICAL EXAMINATION:  VITAL SIGNS:  Blood pressure 110/64, pulse 97,  respirations 18, saturation 99%.   She is halfway through her radiation and chemotherapy and is having a  good response to her treatments.  Her mediastinoscopy sites will heal.  Overall she is tolerating her treatments well.   FOLLOWUP:  I will see her back again if we can be of any further help.   Ines Bloomer, M.D.  Electronically Signed   DPB/MEDQ  D:  04/02/2007  T:  04/03/2007  Job:  [id number]

## 2010-09-27 NOTE — H&P (Signed)
Leah Howell, FROIO            ACCOUNT NO.:  [id number]   MEDICAL RECORD NO.:  [id number]          PATIENT TYPE:  INP   LOCATION:  3311                         FACILITY:  [facility]   PHYSICIAN:  Ines Bloomer, M.D. DATE OF BIRTH:  11/03/1945   DATE OF ADMISSION:  02/25/2007  DATE OF DISCHARGE:                              HISTORY & PHYSICAL   CHIEF COMPLAINT:  Shortness of breath.   HISTORY OF PRESENT ILLNESS:  This is a 65 year old patient who has a  long history of tobacco abuse; she quit smoking in 2004, and comes in  today with a recent exacerbation of her chronic obstructive pulmonary  disease with shortness of breath at rest as well as with exertion.  She  is presently on 20 mg daily albuterol inhaler, Atrovent and ProAir  inhaler.  Chest x-ray showed mediastinal adenopathy and a CT scan showed  a right peritracheal mass with chronic obstructive pulmonary disease.  There is also some narrowing at the distal end of trachea and external  compression from the right peritracheal mass.  She has had no  hemoptysis, fever, chills, excessive sputum.  The mass measured 4.4 x  2.5 x 7.5 cm.  No other pulmonary nodules were seen.   PAST MEDICAL HISTORY:  She has no drug allergies.   MEDICATIONS:  As above.   FAMILY HISTORY:  Positive for vascular disease.   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   REVIEW OF SYSTEMS:  She is 119 pounds, 5 feet 6 inches.  CARDIAC:  No  angina or atrial fibrillation.  PULMONARY:  See History of Present  Illness.  GI:  She has a hiatal hernia.  GU:  No kidney disease, dysuria  or frequent urination.  VASCULAR:  No claudication, DVT, TIAs.  NEUROLOGICAL:  She has chronic headaches.  MUSCULOSKELETAL:  No joint  pain or rash.  PSYCHIATRIC:  No depression or nervousness.  EYE/ENT:  No  change in her eyesight or hearing.  HEMATOLOGICAL:  No problems with  bleeding or clotting disorders.   PHYSICAL  EXAMINATION:  GENERAL:  She is a thin Caucasian female in no  acute distress.  VITAL SIGNS:  Her blood pressure is 134/60, pulse 100, respirations 18,  sats are 97%.  HEAD:  Atraumatic.  EYES:  Pupils equal, reactive to light and accommodation.  Extraocular  movements are normal.  EARS:  Tympanic membranes intact.  NOSE:  There is no septal deviation.  THROAT:  Without lesion.  NECK:  Supple without thyromegaly.  There is no supraclavicular or  axillary adenopathy, though she use her accessory muscles to breathe.  CHEST:  Distant breath sounds with some bilateral wheezes.  Increased AP  diameter.  HEART:  Regular sinus rhythm.  No murmur.  ABDOMEN:  Soft.  There is no hepatosplenomegaly.  Bowel sounds are  normal.  EXTREMITIES:  Pulses are 2+.  There is no clubbing or edema.  NEUROLOGICAL:  She is oriented x3.  Sensory and motor intact.  Cranial  nerves intact.  SKIN:  Without lesion.   IMPRESSION:  1. 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  2. Chronic obstructive pulmonary disease.  3. History of tobacco abuse.   PLAN:  Admit for radiation and chemotherapy.      Ines Bloomer, M.D.  Electronically Signed     DPB/MEDQ  D:  02/25/2007  T:  02/25/2007  Job:  [id number]

## 2010-09-27 NOTE — Letter (Signed)
February 21, 2007   Jeoffrey Massed, MD  [address]  [place], [place] [address]   Re:  BAILYN, SPACKMAN                  DOB:  12/21/1945   Dear Dr. Milinda Cave,   I saw Ms. Leah Howell in the office today. This 65 year old patient has a  long history of tobacco abuse. She quit smoking in 2004 and comes in  today with her daughter but she has had recent exacerbation of chronic  obstructive pulmonary disease with shortness of breath at rest as well  as with exertion. She is presently on prednisone 20 mg daily, albuterol  inhaler, Atrovent and ProAir inhaler. A chest x-ray was done that showed  mediastinal adenopathy and then a CT scan showed a right peritracheal  mass and severe chronic obstructive pulmonary disease. Her sed rate was  5 and her CBC was normal. Potassium was 3.6. We did PFTs today which  showed an FEC of 2.6 with 70% of predicted with an FEV1 of 0.94 and 36%  of predicted which would go along with her severe pulmonary disease. The  CT scan showed a 4.4 x 2.5 x 5 cm right nodal mass. No pulmonary nodules  were seen.   PAST MEDICAL HISTORY:  She has no known drug allergies.   MEDICATIONS:  As recorded.   FAMILY HISTORY:  Positive for vascular disease.   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   REVIEW OF SYSTEMS:  She is 119 pounds, she is 5 foot 6. CARDIAC:  She  has no angina or atrial fibrillation. PULMONARY:  See history of present  illness. She had no bronchitis or no hemoptysis. GI:  She has got a  hiatal hernia. GU:  No kidney disease, dysuria or frequent urination.  VASCULAR:  No claudication, DVT, TIAs. NEUROLOGIC:  She gets chronic  headaches. MUSCULOSKELETAL:  No joint pain or rash. No psychiatric  illness. No change in her eyesight or hearing. No positive bleeding and  no clotting disorders.   PHYSICAL EXAMINATION:  GENERAL:  She is a thin, Caucasian female in no  acute distress.  VITAL SIGNS:  Her  blood pressure is 134/58, pulse 100, respirations 18,  saturations 97%.  HEENT:  Unremarkable.  NECK:  Supple without thyromegaly although she is using some accessory  muscles to breath.  CHEST:  She has distant breath sounds, increased AP diameter, bilateral  wheezes.  HEART:  Regular sinus rhythm. No murmurs.  ABDOMEN:  Soft, no hepatosplenomegaly.  Pulses 2+, there is no clubbing or edema.  NEUROLOGIC:  She is oriented x3, sensory and motor intact. Cranial  nerves are intact.   Unfortunately I think Ms. Reinig either small cell lung cancer or  lymphoma. I doubt that this is sarcoidosis. I plan to do a bronchoscopy  and mediastinoscopy on the 13th and I did tell her that she may need to  be admitted if she had some problems with general anesthesia. I  scheduled a PET scan and brain scan for her on the 15th. I appreciate  the opportunity to see Ms. Mancillas.   Ines Bloomer, M.D.  Electronically Signed   DPB/MEDQ  D:  02/21/2007  T:  02/22/2007  Job:  [id number]

## 2010-09-27 NOTE — Op Note (Signed)
NAMEHALLE, Leah Howell            ACCOUNT NO.:  [id number]   MEDICAL RECORD NO.:  [id number]          PATIENT TYPE:  INP   LOCATION:  3311                         FACILITY:  [facility]   PHYSICIAN:  Ines Bloomer, M.D. DATE OF BIRTH:  01-Dec-1945   DATE OF PROCEDURE:  DATE OF DISCHARGE:                               OPERATIVE REPORT   PREOPERATIVE DIAGNOSIS:  Right paratracheal mass with distal tracheal  stenosis.   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   SURGEON:  Ines Bloomer, M.D.   ANESTHESIA:  General.   OPERATION:  Fiberoptic bronchoscopy and mediastinoscopy.   DESCRIPTION OF PROCEDURE:  After general anesthesia, the video  bronchoscope was passed through the endotracheal tube and you could see  that there was a 50% narrowing of the distal trachea with external  compression from the right paratracheal mass.  The carina was in the  midline.  The left mainstem, left upper lobe, and left lower lobe  orifices were normal.  The right mainstem, right upper lobe, right  middle lobe and right lower lobe orifices were normal.  We biopsied this  area of external compression and did Wang needles to that area.  The  video bronchoscope was removed.  The anterior neck was prepped and  draped in the usual sterile manner.  A transverse incision was made,  carried down with electrocautery to subcutaneous tissues.  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  The strap muscles were closed with 2-0 Vicryl, subcutaneous tissue with  3-0 Vicryl and Dermabond for the skin.  Patient tolerated the procedure  well and returned to the recovery room in stable condition.      Ines Bloomer, M.D.  Electronically Signed     DPB/MEDQ  D:  02/25/2007  T:  02/25/2007  Job:  [id number]   cc:   Jeoffrey Massed, MD

## 2010-09-27 NOTE — Discharge Summary (Signed)
NAMEGIAVANNA, Leah Howell            ACCOUNT NO.:  [id number]   MEDICAL RECORD NO.:  [id number]          PATIENT TYPE:  INP   LOCATION:  1319                         FACILITY:  [facility]   PHYSICIAN:  Lajuana Matte, MD  DATE OF BIRTH:  [birth year]   DATE OF ADMISSION:  02/26/2007  DATE OF DISCHARGE:  03/01/2007                               DISCHARGE SUMMARY   DISCHARGE DIAGNOSES:  1. Stage 3 nonsmall cell lung cancer.  2. Distal tracheal stenosis, secondary to #1.  3. Dyspnea, secondary to #2.  4. Chronic obstructive pulmonary disease.   HISTORY:  Leah Howell is a pleasant 65 year old white female with  worsening dyspnea, not responding to antibiotics or inhalers.  She has  longstanding history of tobacco abuse; however, quit smoking in 2004.  She underwent a chest x-ray to evaluate her presenting complaints.  The  chest x-ray revealed a mediastinal adenopathy, and a CT scan showed  right paratracheal mass with chronic obstructive pulmonary disease.  It  was narrowing at the distal end of the trachea, and external compression  from the right paratracheal mass.  The patient was admitted for further  evaluation and management.   HOSPITAL COURSE:  The patient underwent mediastinoscopy, performed by  Dr. Edwyna Shell, as well as completion of staging workup with a PET CT, and a  CT of the head with contrast.  There was no acute intracranial  abnormalities on the CT of the head.  The PET CT revealed a large  peripherally hypermetabolic right paratracheal mass, consistent with  malignancy.  The peripheral intensity suggests a central necrosis.  There was no evidence of additional hypermetabolic mediastinal, axillary  or supraclavicular adenopathy.  There was no evidence of distant  metastatic disease in the abdomen, pelvis or neck.   Dr. Edwyna Shell consulted Dr. Shirline Frees regarding further care, and also  consulted Dr. Kathrynn Running regarding radiation therapy.  With the results of  the imaging  studies, the patient was felt to have stage 3a disease.  Dr.  Kathrynn Running was consulted and the patient began radiation therapy, receiving  a total of 4 of 10 planned fractions while admitted.  She will  ultimately undergo a course of concurrent chemoradiation on a outpatient  basis.   For GI prophylaxis the patient was treated with Protonix, and for DVT  prophylaxis she was placed sequential compression devices.  For her  dyspnea, which was felt secondary to tracheal compression by the tumor,  she was treated with oxygen, steroids and albuterol nebulizers.   DISCHARGE CONDITION:  Fair.   DISCHARGE DIET:  No restrictions.   DISCHARGE ACTIVITIES:  Increase activity slowly.   DISCHARGE MEDICATIONS:  1. Albuterol 2.5 mg/3 mL nebulized q.i.d.  2. Protonix 40 mg p.o. daily.  3. Prednisone taper as follows:  40 mg p.o. b.i.d. for 5 days, then 20      mg p.o. b.i.d. for 5 days; then 10 mg p.o. daily. for one week.  4. Xanax 0.25 mg p.o. q. 12 h anxiety.  5. Percocet 5/325 mg p.o. q.6 h. p.r.n. pain.   DISCHARGE FOLLOWUP:  The patient is to follow up with Dr.  Mohammed as  scheduled on March 18, 2007.  She is to follow up with Dr. Kathrynn Running and  to continue radiation therapy as scheduled.      Tiana Loft, PA.      Lajuana Matte, MD  Electronically Signed    AJ/MEDQ  D:  04/23/2007  T:  04/24/2007  Job:  [id number]

## 2010-09-27 NOTE — Consult Note (Signed)
Leah Howell, Leah Howell            ACCOUNT NO.:  [id number]   MEDICAL RECORD NO.:  [id number]          PATIENT TYPE:  INP   LOCATION:  1319                         FACILITY:  [facility]   PHYSICIAN:  Lajuana Matte, MD  DATE OF BIRTH:  [birth year]   DATE OF CONSULTATION:  02/26/2007  DATE OF DISCHARGE:  03/01/2007                                 CONSULTATION   REFERRING PHYSICIAN:  Dr. Edwyna Shell.   REASON FOR CONSULT:  Lung cancer.   HPI:  Leah Howell is a pleasant 65 year old white female with no  significant medical history, who presented recently with worsening  dyspnea, not responding to antibiotics and inhalers.  A chest x-ray on  February 13, 2007, revealed a right paratracheal mass.  This was followed  by CT of the chest on February 14, 2007, which showed a 4.4 x 2.8 x 5 cm  nodule/mass in the azygos region in the mediastinum, compressing the  superior vena cava with no evidence of stenosis.  No other  lymphadenopathy.  The patient was referred to Dr. Edwyna Shell and on February 25, 2007, she underwent a fiberoptic bronchoscopy and mediastinoscopy.  She was found to have a 50% narrowing of the distal trachea with  external compression from the paratracheal mass.  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  Dr.  Kathrynn Running was consulted for palliative radiation and the patient had her  first fraction today.  CT of the head without contrast was negative for  intracranial metastasis.  Leah Howell is feeling well today, with no  specific complaints except for mild dyspnea.  Based on these findings,  we were asked to see her with recommendations regarding her care.   PAST MEDICAL HISTORY:  1. COPD.  2. Former tobacco abuse.  3. Migraine headaches.  4. GERD.  5. Hiatal hernia.  6. Cataracts.  7. Anxiety.   SURGERY:  1. Status post trigger finger surgery 15 years ago.  2. Status post bilateral tubal ligation.  3. Status post bronchoscopy and  mediastinoscopy by Dr. Edwyna Shell on      February 25, 2007.   ALLERGIES:  ADHESIVE TAPE.   CURRENT MEDICATIONS:  Ventolin, Zinacef, Solu-Medrol, Xanax, Percocet,  Ambien.   REVIEW OF SYSTEMS:  Remarkable for fatigue, headache, shortness of  breath, orthopnea, dyspnea on exertion.  Bruises easily.  Rest of the  review of systems is negative.   FAMILY HISTORY:  Not remarkable for lung cancer in any of the family  members.  Her mother died with heart disease and father died of natural  causes.   SOCIAL HISTORY:  1. The patient is married, she has 2 children.  2. She is a retired Environmental manager.  3. She quit 40 years ago the use of tobacco, about 1 pack a day for at      least 40 years.  4. No alcohol history.  5. Lives in [place].   PHYSICAL EXAM:  This is a well-developed 65 year old white female in no  acute distress, alert and oriented x3.  VITAL SIGNS:  Stable, afebrile.  Weight 119 pounds, height 5 feet  6  inches.  HEENT:  Normocephalic, atraumatic, PERRLA.  Mucosa without thrush or  lesions.  NECK:  Supple, no cervical or supraclavicular masses.  LUNGS:  Remarkable for diffuse bilateral wheezing and rhonchi, no rales,  no axillary masses.  BREASTS:  Without masses.  CARDIOVASCULAR:  Regular rate and rhythm without murmurs, rubs or  gallops.  ABDOMEN:  Soft, nontender, bowel sounds x4, no palpable spleen or liver.  GU AND RECTAL:  Deferred.  EXTREMITIES:  No clubbing or cyanosis, no edema.  SKIN:  Remarkable for bruising at the bronch site, still with mild  inflammatory changes, and erythema, but no purulence is seen.  NEURO:  Nonfocal.   LABS:  Hemoglobin 16.1, hematocrit 47.9, white count 12.3, platelets  282,000, MCV 91.3, PT 12.2, PTT 29, INR was 0.9, sodium 133, potassium  3.9, BUN 16, creatinine 1.11, glucose 99, calcium 9.4.   ASSESSMENT AND PLAN:  This is a pleasant 65 year old white female, with  at least stage IIIA nonsmall cell lung carcinoma, TX N2 MX,  squamous  cell carcinoma.  The plan is as follows:  1. Complete staging workup by ordering a PET scan and MRI of the      brain, which is scheduled for February 27, 2007.  2. If the patient has no evidence of metastasis, she will benefit from      a course of concurrent chemotherapy and radiation, for a total of 6      to 7 weeks.  These will be discussed in detail after the staging      workup is complete.  3. If she has metastatic disease, then she will continue a short      course of palliative radiation as planned by Dr. Kathrynn Running.  This is      going to be followed by systemic chemotherapy.  The patient has a      sister who lives close to [facility], and she mentioned an      interest to go there for chemotherapy if needed, as she will stay      with her sister.  Dr. Arbutus Ped, who has seen the patient and      evaluated the films, will continue to follow up with you.   Thank you very much for allowing [place] the opportunity to participate in  the care of Leah Howell.  Dr. Arbutus Ped will be happy to move her to  his service, if needed.      Marlowe Kays, P.A.      Lajuana Matte, MD  Electronically Signed    SW/MEDQ  D:  02/28/2007  T:  03/01/2007  Job:  [id number]   cc:   Andrey Spearman, Dr.

## 2010-09-27 NOTE — Consult Note (Signed)
NAMEJUNEAU, DOUGHMAN            ACCOUNT NO.:  [id number]   MEDICAL RECORD NO.:  [id number]          PATIENT TYPE:  INP   LOCATION:  3311                         FACILITY:  [facility]   PHYSICIAN:  Oretha Milch, MD      DATE OF BIRTH:  11/01/1945   DATE OF CONSULTATION:  02/25/2007  DATE OF DISCHARGE:                                 CONSULTATION   PRIMARY CARE PHYSICIAN:  Dr. Nicoletta Ba.   HISTORY OF PRESENT ILLNESS:  Ms. Menton is a pleasant 65 year old  Caucasian woman who presented with a COPD exacerbation that did not  respond to steroids, antibiotics, and broncho dilators.  A chest x-ray  then showed a right peritracheal mass and, a subsequent CT chest  confirmed this right peritracheal mass, measuring 4.5 x 2.5 x 5 cm.  No  pulmonary nodules were seen.  Pre-op pulmonary function test showed an  FEV1 of 0.94 (36%), and an PVC of 2.60 (70%), consistent with severe  COPD.  She underwent fiberoptic bronchoscopy today, which showed a 50%  narrowing of the trachea.  The trachea on bronchoscopic appears to be  saber shaped distally.  Mediastinoscopy was then performed with further  resection biopsy of the peritracheal mass (4R nodes).  This confirmed  nonsmall-cell lung cancer, probably squamous cell cancer.  The patient  and the family have been informed of diagnosis.   Apparently, her breathing was worse last night, and the daughter reports  that this may be secondary to anxiety.  The patient had a coughing spell  while I was in the room, and coughed up bloody sputum.   PAST MEDICAL HISTORY:  Includes COPD as above.   PAST SURGICAL HISTORY:  None.   ALLERGIES:  ADHESIVE TAPE and METAL cause skin reaction.   CURRENT MEDICATIONS:  Include:  1. Prednisone 20 mg daily.  2. Atrovent 2-4 puffs q.6 hours.  3. ProAir HFA 2 puffs every 4 hours as needed.  4. Ibuprofen.  5. Tylenol.   SOCIAL HISTORY:  She smoked about a half a pack per day for many years  until she quit in  2004.  She is a Environmental manager, married, and has 2  daughters.   FAMILY HISTORY:  Positive for vascular disease.   REVIEW OF SYSTEMS:  Denies chest pain.  Reports soreness in her chest,  bloody sputum, and wheezing.  Denies weight loss, headaches.   PHYSICAL EXAMINATION:  VITAL SIGNS:  Weight 119 pounds.  Height 5 feet 6  inches.  Heart rate 78 per minute sinus on the monitor.  Blood pressure  118/76, respirations 20 per minute, oxygen saturation 98% on 2 liters  nasal cannula.  HEENT:  Pupils 3 mm, brisk, and reactive to light.  No pharyngeal mass.  NECK:  Supple.  No JVD.  No lymphadenopathy.  CV:  S1, S2 are normal.  CHEST:  Diffuse rhonchi.  Inspiratory was louder than expiratory.  ABDOMEN:  Soft, nontender.  No organomegaly.  NEUROLOGICAL:  Nonfocal.  EXTREMITIES:  No edema.   LABORATORY DATA:  Sodium 138, potassium 3.9, bicarbonate 24, chloride  99, BUN 16, creatinine 1.1, INR 0.9.  WBC count 12.3, hemoglobin 16.1,  platelets 282.  Imaging studies as described above.   IMPRESSION:  1. Nonsmall-cell lung cancer presenting as a right peritracheal mass.  2. Fixed intrathoracic obstruction obstructing almost 50% of the      distal tracheal lumen extrinsically.  3. Severe chronic obstructive pulmonary disease.  4. Hemoptysis related to bronchoscopic procedure.   RECOMMENDATIONS:  1. We will give IV steroids, use IV Solu-Medrol 40 mg b.i.d. since she      was already on p.o. steroids preoperatively, and also for any      bronchospasm, although I feel that the rhonchi on her chest exam      are likely emanating from her distal trachea.  Of note, inspiratory      rhonchi seems to be louder than the expiratory.  2. We will use Tussionex 5 mL p.o. b.i.d. to stop her coughing, and      hopefully this will also relieve the soreness in her chest.  3. The plan is to stage her by a head CT and a PET scan, and transfer      her to [facility] within the next 24 hours so she can get       radiotherapy.  Hopefully, this will help to shrink the peritracheal      mass and improve her dyspnea.   Thank you Dr. Edwyna Shell for involving [place] in the care of this patient.  We  will assist with her management during her hospital stay and after.      Oretha Milch, MD  Electronically Signed     RVA/MEDQ  D:  02/25/2007  T:  02/25/2007  Job:  [id number]   cc:   Jeoffrey Massed, MD

## 2011-01-18 ENCOUNTER — Encounter (HOSPITAL_COMMUNITY): Payer: Self-pay

## 2011-01-18 ENCOUNTER — Encounter: Payer: Medicare Other | Admitting: Internal Medicine

## 2011-01-18 ENCOUNTER — Other Ambulatory Visit: Payer: Self-pay | Admitting: Internal Medicine

## 2011-01-18 ENCOUNTER — Ambulatory Visit (HOSPITAL_COMMUNITY)
Admission: RE | Admit: 2011-01-18 | Discharge: 2011-01-18 | Disposition: A | Discharge status: Home / Self Care | Payer: Medicare Other | Source: Ambulatory Visit | Attending: Internal Medicine | Admitting: Internal Medicine

## 2011-01-18 DIAGNOSIS — C3490 Malignant neoplasm of unspecified part of unspecified bronchus or lung: Secondary | ICD-10-CM | POA: Insufficient documentation

## 2011-01-18 LAB — CMP (CANCER CENTER ONLY)
ALT(SGPT): 13 U/L (ref 10–47)
AST: 25 U/L (ref 11–38)
Albumin: 3.4 g/dL (ref 3.3–5.5)
Alkaline Phosphatase: 64 U/L (ref 26–84)
Potassium: 4.3 mEq/L (ref 3.3–4.7)
Sodium: 144 mEq/L (ref 128–145)
Total Bilirubin: 0.5 mg/dl (ref 0.20–1.60)
Total Protein: 6.4 g/dL (ref 6.4–8.1)

## 2011-01-18 LAB — CBC WITH DIFFERENTIAL/PLATELET
BASO%: 0.3 % (ref 0.0–2.0)
EOS%: 6.1 % (ref 0.0–7.0)
Eosinophils Absolute: 0.2 10*3/uL (ref 0.0–0.5)
LYMPH%: 20.6 % (ref 14.0–49.7)
MCHC: 34.3 g/dL (ref 31.5–36.0)
MCV: 88.4 fL (ref 79.5–101.0)
MONO%: 9.3 % (ref 0.0–14.0)
NEUT#: 2.1 10*3/uL (ref 1.5–6.5)
RBC: 4.38 10*6/uL (ref 3.70–5.45)
RDW: 14.2 % (ref 11.2–14.5)

## 2011-01-18 MED ORDER — IOHEXOL 300 MG/ML  SOLN
80.0000 mL | Freq: Once | INTRAMUSCULAR | Status: AC | PRN
  Administered 2011-01-18: 80 mL via INTRAVENOUS

## 2011-01-25 ENCOUNTER — Encounter (HOSPITAL_BASED_OUTPATIENT_CLINIC_OR_DEPARTMENT_OTHER): Payer: Medicare Other | Admitting: Internal Medicine

## 2011-01-25 ENCOUNTER — Other Ambulatory Visit: Payer: Self-pay | Admitting: Internal Medicine

## 2011-01-25 DIAGNOSIS — C3490 Malignant neoplasm of unspecified part of unspecified bronchus or lung: Secondary | ICD-10-CM

## 2011-02-20 LAB — URINALYSIS, ROUTINE W REFLEX MICROSCOPIC
Glucose, UA: NEGATIVE
Nitrite: NEGATIVE
pH: 8

## 2011-02-20 LAB — DIFFERENTIAL
Basophils Absolute: 0
Eosinophils Relative: 0
Lymphocytes Relative: 8 — ABNORMAL LOW
Neutro Abs: 3

## 2011-02-20 LAB — BASIC METABOLIC PANEL
BUN: 4 — ABNORMAL LOW
Calcium: 8.3 — ABNORMAL LOW
GFR calc non Af Amer: >60
Glucose, Bld: 113 — ABNORMAL HIGH

## 2011-02-20 LAB — CULTURE, BLOOD (ROUTINE X 2)

## 2011-02-20 LAB — CBC
HCT: 29.3 — ABNORMAL LOW
Platelets: 161
RDW: 18.9 — ABNORMAL HIGH

## 2011-02-20 LAB — URINE CULTURE

## 2011-02-22 LAB — CBC
HCT: 39.7
Hemoglobin: 13.7
Platelets: 164
RBC: 4.54
RDW: 13.5
WBC: 9.8

## 2011-02-22 LAB — COMPREHENSIVE METABOLIC PANEL
Alkaline Phosphatase: 57
BUN: 18
CO2: 29
Chloride: 100
GFR calc non Af Amer: 57 — ABNORMAL LOW
Glucose, Bld: 123 — ABNORMAL HIGH
Potassium: 4.5
Total Bilirubin: 0.6
Total Protein: 5 — ABNORMAL LOW

## 2011-02-22 LAB — BASIC METABOLIC PANEL
Calcium: 8.8
Creatinine, Ser: 0.7
GFR calc Af Amer: >60
GFR calc non Af Amer: >60

## 2011-02-22 LAB — LACTATE DEHYDROGENASE: LDH: 189

## 2011-02-23 LAB — TYPE AND SCREEN
ABO/RH(D): A POS
Antibody Screen: NEGATIVE

## 2011-02-23 LAB — CBC
Hemoglobin: 16.1 — ABNORMAL HIGH
MCHC: 33.6
MCV: 91.3
RBC: 5.25 — ABNORMAL HIGH
WBC: 12.3 — ABNORMAL HIGH

## 2011-02-23 LAB — BASIC METABOLIC PANEL
CO2: 24
Calcium: 9.4
Chloride: 99
GFR calc Af Amer: >60
Sodium: 133 — ABNORMAL LOW

## 2011-02-23 LAB — FUNGUS CULTURE W SMEAR: Fungal Smear: NONE SEEN

## 2011-02-23 LAB — CULTURE, RESPIRATORY W GRAM STAIN

## 2011-02-23 LAB — PROTIME-INR: INR: 0.9

## 2011-02-23 LAB — AFB CULTURE WITH SMEAR (NOT AT ARMC)

## 2011-06-17 ENCOUNTER — Telehealth: Payer: Self-pay | Admitting: Internal Medicine

## 2011-06-17 NOTE — Telephone Encounter (Signed)
Called pt, left message for lab and CT on 07/24/11 and md visit a few days later

## 2011-07-21 ENCOUNTER — Ambulatory Visit (HOSPITAL_COMMUNITY)
Admission: RE | Admit: 2011-07-21 | Discharge: 2011-07-21 | Disposition: A | Discharge status: Home / Self Care | Payer: Medicare Other | Source: Ambulatory Visit | Attending: Internal Medicine | Admitting: Internal Medicine

## 2011-07-21 ENCOUNTER — Other Ambulatory Visit (HOSPITAL_BASED_OUTPATIENT_CLINIC_OR_DEPARTMENT_OTHER): Payer: Medicare Other | Admitting: Lab

## 2011-07-21 ENCOUNTER — Other Ambulatory Visit: Payer: Self-pay | Admitting: Internal Medicine

## 2011-07-21 DIAGNOSIS — C3490 Malignant neoplasm of unspecified part of unspecified bronchus or lung: Secondary | ICD-10-CM

## 2011-07-21 DIAGNOSIS — J479 Bronchiectasis, uncomplicated: Secondary | ICD-10-CM | POA: Diagnosis not present

## 2011-07-21 DIAGNOSIS — J984 Other disorders of lung: Secondary | ICD-10-CM | POA: Insufficient documentation

## 2011-07-21 LAB — CMP (CANCER CENTER ONLY)
Alkaline Phosphatase: 81 U/L (ref 26–84)
BUN, Bld: 15 mg/dL (ref 7–22)
CO2: 29 mEq/L (ref 18–33)
Creat: 1 mg/dl (ref 0.6–1.2)
Glucose, Bld: 94 mg/dL (ref 73–118)
Sodium: 140 mEq/L (ref 128–145)
Total Bilirubin: 0.5 mg/dl (ref 0.20–1.60)
Total Protein: 6.9 g/dL (ref 6.4–8.1)

## 2011-07-21 LAB — CBC WITH DIFFERENTIAL/PLATELET
Basophils Absolute: 0 10*3/uL (ref 0.0–0.1)
Eosinophils Absolute: 0.2 10*3/uL (ref 0.0–0.5)
HCT: 40.9 % (ref 34.8–46.6)
HGB: 14.1 g/dL (ref 11.6–15.9)
LYMPH%: 20.6 % (ref 14.0–49.7)
MCV: 88.5 fL (ref 79.5–101.0)
MONO#: 0.4 10*3/uL (ref 0.1–0.9)
MONO%: 10 % (ref 0.0–14.0)
NEUT#: 2.2 10*3/uL (ref 1.5–6.5)
NEUT%: 63.2 % (ref 38.4–76.8)
Platelets: 185 10*3/uL (ref 145–400)
RBC: 4.63 10*6/uL (ref 3.70–5.45)
WBC: 3.5 10*3/uL — ABNORMAL LOW (ref 3.9–10.3)

## 2011-07-21 MED ORDER — IOHEXOL 300 MG/ML  SOLN
80.0000 mL | Freq: Once | INTRAMUSCULAR | Status: AC | PRN
  Administered 2011-07-21: 80 mL via INTRAVENOUS

## 2011-07-25 ENCOUNTER — Telehealth: Payer: Self-pay | Admitting: Internal Medicine

## 2011-07-25 ENCOUNTER — Ambulatory Visit (HOSPITAL_BASED_OUTPATIENT_CLINIC_OR_DEPARTMENT_OTHER): Payer: Medicare Other | Admitting: Internal Medicine

## 2011-07-25 VITALS — BP 139/74 | HR 73 | Temp 96.8°F | Ht 66.0 in | Wt 129.2 lb

## 2011-07-25 DIAGNOSIS — C3490 Malignant neoplasm of unspecified part of unspecified bronchus or lung: Secondary | ICD-10-CM | POA: Diagnosis not present

## 2011-07-25 NOTE — Progress Notes (Signed)
[facility] [phone]   Fax:([phone]  OFFICE PROGRESS NOTE  PRINCIPAL DIAGNOSIS:  Stage IIIA (TX, N2, MX) non-small cell lung cancer, squamous cell carcinoma diagnosed in October 2008.  PRIOR THERAPY:   1. Status post concurrent chemoradiation with weekly carboplatin and paclitaxel.  Last dose was given April 05, 2007. 2. Status post 3 cycles of consolidation chemotherapy with docetaxel, last dose was given July 15, 2007.  CURRENT THERAPY:  Observation.  INTERVAL HISTORY: Leah Howell 66 y.o. female returns to the clinic today for routine six-month followup visit accompanied her sister and daughter Bjorn Loser. The patient has no complaints today. She has no chest pain or shortness of breath, no cough or hemoptysis. No weight loss or night sweats. She had an episode of double vision which is resolved. She was evaluated by her ophthalmologist and it was attributed to weakness in one of the orbital muscles. The patient has repeat CT scan of the chest performed recently and she is here for evaluation and discussion of her scan results.  MEDICAL HISTORY: Past Medical History  Diagnosis Date  . lung ca dx'd 2008    chemo comp 07/15/07; xrt comp 04/05/07    ALLERGIES:   has no known allergies.  MEDICATIONS:  Current Outpatient Prescriptions  Medication Sig Dispense Refill  . budesonide-formoterol (SYMBICORT) 160-4.5 MCG/ACT inhaler Inhale 2 puffs into the lungs 2 (two) times daily.       REVIEW OF SYSTEMS:  A comprehensive review of systems was negative.   PHYSICAL EXAMINATION: General appearance: alert, cooperative and no distress Neck: no adenopathy Lymph nodes: Cervical, supraclavicular, and axillary nodes normal. Resp: clear to auscultation bilaterally Cardio: regular rate and rhythm, S1, S2 normal, no murmur, click, rub or gallop GI: soft, non-tender; bowel sounds normal; no masses,  no organomegaly Extremities: extremities normal,  atraumatic, no cyanosis or edema Neurologic: Alert and oriented X 3, normal strength and tone. Normal symmetric reflexes. Normal coordination and gait  ECOG PERFORMANCE STATUS: 0 - Asymptomatic  Blood pressure 139/74, pulse 73, temperature 96.8 F (36 C), temperature source Oral, height 5\' 6"  (1.676 m), weight 129 lb 3.2 oz (58.605 kg).  LABORATORY DATA: Lab Results  Component Value Date   WBC 3.5* 07/21/2011   HGB 14.1 07/21/2011   HCT 40.9 07/21/2011   MCV 88.5 07/21/2011   PLT 185 07/21/2011      Chemistry      Component Value Date/Time   NA 140 07/21/2011 0920   NA 139 07/21/2010 1207   K 3.7 07/21/2011 0920   K 4.3 07/21/2010 1207   CL 98 07/21/2011 0920   CL 104 07/21/2010 1207   CO2 29 07/21/2011 0920   CO2 24 07/21/2010 1207   BUN 15 07/21/2011 0920   BUN 17 07/21/2010 1207   CREATININE 1.0 07/21/2011 0920   CREATININE 1.00 07/21/2010 1207      Component Value Date/Time   CALCIUM 9.1 07/21/2011 0920   CALCIUM 9.2 07/21/2010 1207   ALKPHOS 81 07/21/2011 0920   ALKPHOS 84 07/21/2010 1207   AST 24 07/21/2011 0920   AST 19 07/21/2010 1207   ALT 12 07/21/2010 1207   BILITOT 0.50 07/21/2011 0920   BILITOT 0.5 07/21/2010 1207       RADIOGRAPHIC STUDIES: Ct Chest W Contrast  07/21/2011  *RADIOLOGY REPORT*  Clinical Data: Lung cancer.  CT CHEST WITH CONTRAST  Technique:  Multidetector CT imaging of the chest was performed following the standard protocol during bolus  administration of intravenous contrast.  Contrast: 80mL OMNIPAQUE IOHEXOL 300 MG/ML IJ SOLN  Comparison: 01/18/2011.  Findings: The chest wall is stable.  No breast masses, supraclavicular or axillary lymphadenopathy.  The thyroid gland appears normal.  The bony thorax is intact.  No destructive bony lesions or spinal canal compromise. Stable mixed lytic and sclerotic process in the sternum may be radiation change.  The heart is normal in size.  No pericardial effusion.  Stable fusiform enlargement of the ascending aorta and moderate atherosclerotic  calcifications but no dissection.  Coronary artery calcifications are stable.  The esophagus is grossly normal.  Examination of the lung parenchyma demonstrates stable emphysematous changes.  Stable dense apical scarring and bronchiectasis likely radiation related. No CT findings to suggest recurrent tumor.  No metastatic pulmonary nodules.  No acute pulmonary findings.  No pleural effusion.  The upper abdomen is unremarkable and stable.  Innumerable small cysts are again noted in the liver.  No adrenal lesions or upper abdominal adenopathy.  IMPRESSION:  1.  Stable CT appearance of the chest.  Dense apical scarring changes and bronchiectasis on the right likely due to radiation. No findings to suggest recurrent tumor. 2.  No metastatic pulmonary nodules or lymphadenopathy.  Original Report Authenticated By: P. Loralie Champagne, M.D.    ASSESSMENT: This is a very pleasant 66 years old white female with history of stage IIIa non-small cell lung cancer status post concurrent chemoradiation followed by consolidation chemotherapy and has been on observation since March of 2009. The patient is doing fine and she has no evidence for disease recurrence.  PLAN: I discussed the scan results with the patient and her family. I recommended for her continuous observation for now with repeat CT scan of the chest in 6 months. She would come back for followup visit at that time. She was advised to call me immediately if she has any concerning symptoms in the interval.  All questions were answered. The patient knows to call the clinic with any problems, questions or concerns. We can certainly see the patient much sooner if necessary.

## 2011-07-25 NOTE — Telephone Encounter (Signed)
gv pt appt schedule for sept °

## 2011-08-17 DIAGNOSIS — J40 Bronchitis, not specified as acute or chronic: Secondary | ICD-10-CM | POA: Diagnosis not present

## 2011-08-17 DIAGNOSIS — R05 Cough: Secondary | ICD-10-CM | POA: Diagnosis not present

## 2012-01-16 DIAGNOSIS — H5210 Myopia, unspecified eye: Secondary | ICD-10-CM | POA: Diagnosis not present

## 2012-01-16 DIAGNOSIS — H2510 Age-related nuclear cataract, unspecified eye: Secondary | ICD-10-CM | POA: Diagnosis not present

## 2012-01-22 ENCOUNTER — Other Ambulatory Visit: Payer: Medicare Other | Admitting: Lab

## 2012-01-22 ENCOUNTER — Ambulatory Visit (HOSPITAL_COMMUNITY)
Admission: RE | Admit: 2012-01-22 | Discharge: 2012-01-22 | Disposition: A | Discharge status: Home / Self Care | Payer: Medicare Other | Source: Ambulatory Visit | Attending: Internal Medicine | Admitting: Internal Medicine

## 2012-01-22 ENCOUNTER — Other Ambulatory Visit: Payer: Self-pay | Admitting: Internal Medicine

## 2012-01-22 DIAGNOSIS — C3490 Malignant neoplasm of unspecified part of unspecified bronchus or lung: Secondary | ICD-10-CM

## 2012-01-22 DIAGNOSIS — X58XXXA Exposure to other specified factors, initial encounter: Secondary | ICD-10-CM | POA: Insufficient documentation

## 2012-01-22 DIAGNOSIS — Y842 Radiological procedure and radiotherapy as the cause of abnormal reaction of the patient, or of later complication, without mention of misadventure at the time of the procedure: Secondary | ICD-10-CM | POA: Insufficient documentation

## 2012-01-22 DIAGNOSIS — J701 Chronic and other pulmonary manifestations due to radiation: Secondary | ICD-10-CM | POA: Diagnosis not present

## 2012-01-22 DIAGNOSIS — S22009A Unspecified fracture of unspecified thoracic vertebra, initial encounter for closed fracture: Secondary | ICD-10-CM | POA: Insufficient documentation

## 2012-01-22 LAB — CBC WITH DIFFERENTIAL/PLATELET
Basophils Absolute: 0 10*3/uL (ref 0.0–0.1)
Eosinophils Absolute: 0.2 10*3/uL (ref 0.0–0.5)
HGB: 13.8 g/dL (ref 11.6–15.9)
LYMPH%: 20.3 % (ref 14.0–49.7)
MCV: 87 fL (ref 79.5–101.0)
MONO#: 0.4 10*3/uL (ref 0.1–0.9)
NEUT#: 2.5 10*3/uL (ref 1.5–6.5)
Platelets: 233 10*3/uL (ref 145–400)
RBC: 4.64 10*6/uL (ref 3.70–5.45)
WBC: 3.8 10*3/uL — ABNORMAL LOW (ref 3.9–10.3)

## 2012-01-22 LAB — COMPREHENSIVE METABOLIC PANEL (CC13)
Albumin: 3.5 g/dL (ref 3.5–5.0)
BUN: 14 mg/dL (ref 7.0–26.0)
CO2: 24 mEq/L (ref 22–29)
Glucose: 93 mg/dl (ref 70–99)
Potassium: 4.2 mEq/L (ref 3.5–5.1)
Sodium: 138 mEq/L (ref 136–145)
Total Protein: 6.6 g/dL (ref 6.4–8.3)

## 2012-01-22 MED ORDER — IOHEXOL 300 MG/ML  SOLN
80.0000 mL | Freq: Once | INTRAMUSCULAR | Status: AC | PRN
  Administered 2012-01-22: 80 mL via INTRAVENOUS

## 2012-01-25 ENCOUNTER — Encounter: Payer: Self-pay | Admitting: Internal Medicine

## 2012-01-25 ENCOUNTER — Telehealth: Payer: Self-pay | Admitting: Internal Medicine

## 2012-01-25 ENCOUNTER — Ambulatory Visit (HOSPITAL_BASED_OUTPATIENT_CLINIC_OR_DEPARTMENT_OTHER): Payer: Medicare Other | Admitting: Internal Medicine

## 2012-01-25 DIAGNOSIS — M8448XA Pathological fracture, other site, initial encounter for fracture: Secondary | ICD-10-CM | POA: Diagnosis not present

## 2012-01-25 DIAGNOSIS — C3490 Malignant neoplasm of unspecified part of unspecified bronchus or lung: Secondary | ICD-10-CM

## 2012-01-25 DIAGNOSIS — C3411 Malignant neoplasm of upper lobe, right bronchus or lung: Secondary | ICD-10-CM | POA: Insufficient documentation

## 2012-01-25 DIAGNOSIS — Z85118 Personal history of other malignant neoplasm of bronchus and lung: Secondary | ICD-10-CM

## 2012-01-25 NOTE — Progress Notes (Signed)
[facility] [phone]   [phone]  OFFICE PROGRESS NOTE  PRINCIPAL DIAGNOSIS: Stage IIIA (TX, N2, MX) non-small cell lung cancer, squamous cell carcinoma diagnosed in October 2008.   PRIOR THERAPY:  1. Status post concurrent chemoradiation with weekly carboplatin and paclitaxel. Last dose was given April 05, 2007. 2. Status post 3 cycles of consolidation chemotherapy with docetaxel, last dose was given July 15, 2007.  CURRENT THERAPY: Observation.  INTERVAL HISTORY: Leah Howell 66 y.o. female returns to the clinic today for routine six-month followup visit accompanied by her sister. The patient is feeling fine today with no specific complaints. She had some back pain 2 months ago that resulted spontaneously. The patient denied having any significant chest pain, shortness breath, cough or hemoptysis. She denied having any significant weight loss or night sweats. She has repeat CT scan of the chest performed recently and she is here for evaluation and discussion of her scan results.  MEDICAL HISTORY: Past Medical History  Diagnosis Date  . lung ca dx'd 2008    chemo comp 07/15/07; xrt comp 04/05/07    ALLERGIES:   has no known allergies.  MEDICATIONS:  Current Outpatient Prescriptions  Medication Sig Dispense Refill  . budesonide-formoterol (SYMBICORT) 160-4.5 MCG/ACT inhaler Inhale 2 puffs into the lungs 2 (two) times daily.        REVIEW OF SYSTEMS:  A comprehensive review of systems was negative.   PHYSICAL EXAMINATION: General appearance: alert, cooperative and no distress Head: Normocephalic, without obvious abnormality, atraumatic Neck: no adenopathy Lymph nodes: Cervical, supraclavicular, and axillary nodes normal. Resp: clear to auscultation bilaterally Back: symmetric, no curvature. ROM normal. No CVA tenderness. Cardio: regular rate and rhythm, S1, S2 normal, no murmur, click, rub or gallop GI: soft, non-tender; bowel  sounds normal; no masses,  no organomegaly Extremities: extremities normal, atraumatic, no cyanosis or edema Neurologic: Alert and oriented X 3, normal strength and tone. Normal symmetric reflexes. Normal coordination and gait  ECOG PERFORMANCE STATUS: 0 - Asymptomatic  There were no vitals taken for this visit.  LABORATORY DATA: Lab Results  Component Value Date   WBC 3.8* 01/22/2012   HGB 13.8 01/22/2012   HCT 40.4 01/22/2012   MCV 87.0 01/22/2012   PLT 233 01/22/2012      Chemistry      Component Value Date/Time   NA 138 01/22/2012 0830   NA 140 07/21/2011 0920   NA 139 07/21/2010 1207   K 4.2 01/22/2012 0830   K 3.7 07/21/2011 0920   K 4.3 07/21/2010 1207   CL 105 01/22/2012 0830   CL 98 07/21/2011 0920   CL 104 07/21/2010 1207   CO2 24 01/22/2012 0830   CO2 29 07/21/2011 0920   CO2 24 07/21/2010 1207   BUN 14.0 01/22/2012 0830   BUN 15 07/21/2011 0920   BUN 17 07/21/2010 1207   CREATININE 1.1 01/22/2012 0830   CREATININE 1.0 07/21/2011 0920   CREATININE 1.00 07/21/2010 1207      Component Value Date/Time   CALCIUM 9.3 01/22/2012 0830   CALCIUM 9.1 07/21/2011 0920   CALCIUM 9.2 07/21/2010 1207   ALKPHOS 85 01/22/2012 0830   ALKPHOS 81 07/21/2011 0920   ALKPHOS 84 07/21/2010 1207   AST 18 01/22/2012 0830   AST 24 07/21/2011 0920   AST 19 07/21/2010 1207   ALT 12 01/22/2012 0830   ALT 12 07/21/2010 1207   BILITOT 0.40 01/22/2012 0830   BILITOT 0.50 07/21/2011 0920  BILITOT 0.5 07/21/2010 1207       RADIOGRAPHIC STUDIES: Ct Chest W Contrast  01/22/2012  *RADIOLOGY REPORT*  Clinical Data: Lung cancer.  Chemotherapy and radiation therapy complete.  CT CHEST WITH CONTRAST  Technique:  Multidetector CT imaging of the chest was performed following the standard protocol during bolus administration of intravenous contrast.  Contrast: 80mL OMNIPAQUE IOHEXOL 300 MG/ML  SOLN  Comparison: 03/13.  Findings: No pathologically enlarged mediastinal, hilar or axillary lymph nodes.  Coronary artery calcification.  Heart size normal. No  pericardial effusion.  Radiation fibrosis and volume loss are seen in the right upper and right middle lobes, as before.  Tiny subpleural nodular density in the right lower lobe (image 22) is unchanged over several prior exams and is therefore considered benign.  Minimal juxta mediastinal radiation fibrosis in the left upper lobe.  No pleural fluid.  Probable non dependent adherent debris in the trachea (image 13), not previously seen.  Incidental imaging of the upper abdomen shows numerous low attenuation lesions throughout the liver, measuring up to 10 mm in the dome, as before.  There are small porta hepatis lymph nodes. Atherosclerotic calcification of the arterial vasculature.  New lower thoracic compression fracture, with approximately 20-30% loss of height.  Mottled appearance of the manubrium and left clavicular head, as before.  IMPRESSION:  1.  Radiation fibrosis and volume loss in the right upper and right middle lobes without evidence of recurrent or metastatic disease. 2.  New lower thoracic compression fracture. 3.  Mottled appearance of the manubrium and left clavicular head may be due to radiation therapy.   Original Report Authenticated By: Reyes Ivan, M.D.     ASSESSMENT: This is a very pleasant 66 years old white female with history of stage IIIa non-small cell lung cancer status post concurrent chemoradiation followed by consolidation chemotherapy and the patient has been observation since March of 2009 with no evidence for disease progression.  PLAN: I discussed the scan results with the patient and her sister. I recommended for her to continue on observation. She is now 5 years from her time of diagnosis. I recommended for her repeat CT scan of the chest in one year. The patient would come back for followup visit at that time. She currently has no significant back pain but she was advised to see an orthopedic surgeon if she develops any significant back pain to discuss treatment  for the compression fracture of her thoracic spine. She was advised to call me immediately if she has any concerning symptoms in the interval.  All questions were answered. The patient knows to call the clinic with any problems, questions or concerns. We can certainly see the patient much sooner if necessary.

## 2012-01-25 NOTE — Telephone Encounter (Signed)
gve the pt her sept 2014 appt calendar °

## 2012-01-25 NOTE — Patient Instructions (Signed)
Your CT scan of the chest showed no evidence for disease progression. Followup in one year with repeat CT scan

## 2012-04-26 ENCOUNTER — Ambulatory Visit (INDEPENDENT_AMBULATORY_CARE_PROVIDER_SITE_OTHER): Payer: Medicare Other | Admitting: Internal Medicine

## 2012-04-26 VITALS — BP 124/82 | HR 97 | Temp 97.7°F | Resp 20 | Ht 67.0 in | Wt 135.0 lb

## 2012-04-26 DIAGNOSIS — R05 Cough: Secondary | ICD-10-CM

## 2012-04-26 DIAGNOSIS — R0602 Shortness of breath: Secondary | ICD-10-CM

## 2012-04-26 DIAGNOSIS — J449 Chronic obstructive pulmonary disease, unspecified: Secondary | ICD-10-CM

## 2012-04-26 MED ORDER — ALBUTEROL SULFATE (2.5 MG/3ML) 0.083% IN NEBU: 2.5000 mg | INHALATION_SOLUTION | Freq: Once | RESPIRATORY_TRACT | Status: DC

## 2012-04-26 MED ORDER — HYDROCODONE-ACETAMINOPHEN 7.5-325 MG/15ML PO SOLN: 5.0000 mL | Freq: Four times a day (QID) | ORAL | Status: DC | PRN

## 2012-04-26 MED ORDER — IPRATROPIUM BROMIDE 0.02 % IN SOLN: 0.5000 mg | Freq: Once | RESPIRATORY_TRACT | Status: DC

## 2012-04-26 MED ORDER — PREDNISONE 10 MG PO TABS: 10.0000 mg | ORAL_TABLET | Freq: Two times a day (BID) | ORAL | Status: DC

## 2012-04-26 MED ORDER — AZITHROMYCIN 500 MG PO TABS: 500.0000 mg | ORAL_TABLET | Freq: Every day | ORAL | Status: DC

## 2012-04-26 NOTE — Patient Instructions (Addendum)
Chronic Asthmatic Bronchitis Chronic asthmatic bronchitis is often a complication of frequent asthma and/or bronchitis. After a long enough period of time, the continual airflow blockage is present in spite of treatment for asthma. The medications that used to treat asthma no longer work. The symptoms of chronic bronchitis may also be present. Bronchitis is an inflammation of the breathing tubules in the lungs. The combination of asthma, chronic bronchitis, and emphysema all affect the small breathing tubules (bronchial tree) in our lungs. It is a common condition. The problems from each are similar and overlap with each other so are sometimes hard to diagnose. When the asthma and bronchitis are combined, there is usually inflammation and infection. The small bronchial tubes produce more mucus. This blocks the airways and makes breathing harder. Usually this process is caused more by external irritants than infection. Smokers with chronic bronchitis are at a greater risk to develop asthmatic bronchitis. CAUSES   Why some people with asthma go on to develop chronic asthmatic bronchitis is not known. Smoking and environmental toxins or allergens seem to play a role. There are wide differences in who is susceptible.  Abnormalities of the small airways may develop in persons with persistent asthma. Asthmatics can be uncommonly subject to the effects of smoking. Asthma is also found associated with a number of other diseases. SYMPTOMS  Asthma, chronic bronchitis, and emphysema all cause symptoms of cough, wheezing, shortness of breath, and recurring infections. There may also be chest discomfort. All of the above symptoms happen more often in chronic asthmatic bronchitis. DIAGNOSIS   Asthma, chronic bronchitis, and emphysema all affect the entire bronchial tree. This makes it difficult on exam to tell them apart. Other tests of the lungs are done to prove a diagnosis. These are called pulmonary function  tests. TREATMENT   The asthmatic condition itself must always be treated.  Infection can be treated with antibiotics (medications to kill germs).  Serious infections may require hospitalization. These can include pneumonia, sinus infections, and acute bronchitis. HOME CARE INSTRUCTIONS  Use prescription medications as ordered by your caregiver.  Avoid pollen, dust, animal dander, molds, smoke, and other things that cause attacks at home and at work.  You may have fewer attacks if you decrease dust in your home. Electrostatic air cleaners may help.  It may help to replace your pillows or mattress with materials less likely to cause allergies.  If you are not on fluid restriction, drink 8 to 10 glasses of water each day.  Discuss possible exercise routines with your caregiver.  If animal dander is the cause of asthma, you may need to get rid of pets.  It is important that you:  Become educated about your medical condition.  Participate in maintaining wellness.  Seek medical care promptly or immediately as indicated below.  Delay in seeking medical attention could cause permanent injury and may be a risk to your life. SEEK MEDICAL CARE IF  You have wheezing and shortness of breath even if taking medicine to prevent attacks.  An oral temperature above 102 F (38.9 C)  You have muscle aches, chest pain, or thickening of sputum.  Your sputum changes from clear or white to yellow, green, gray, or bloody.  You have any problems that may be related to the medicine you are taking (such as a rash, itching, swelling, or trouble breathing). SEEK IMMEDIATE MEDICAL CARE IF:  Your usual medicines do not stop your wheezing.  There is increased coughing and/or shortness of breath.  You   have increased difficulty breathing. MAKE SURE YOU:   Understand these instructions.  Will watch your condition.  Will get help right away if you are not doing well or get worse. Document  Released: 02/16/2006 Document Revised: 07/24/2011 Document Reviewed: 04/16/2007 Exit[employer] Patient Information 2013 [employer].  

## 2012-04-26 NOTE — Progress Notes (Signed)
  Subjective:    Patient ID: Leah Howell, female    DOB: [birth year], 66 y.o.   MRN: [id number]  HPI Has new bug with sinus congestion, cough, and foul discharge. Is a little more sob than usual Phx cancer and radiation damage to right lung also.    Review of Systems     Objective:   Physical Exam  Vitals reviewed. Constitutional: She is oriented to person, place, and time. She appears well-developed and well-nourished. No distress.  HENT:  Right Ear: External ear normal.  Howell Ear: External ear normal.  Nose: Mucosal edema and rhinorrhea present. Right sinus exhibits maxillary sinus tenderness. Howell sinus exhibits maxillary sinus tenderness.  Mouth/Throat: Oropharynx is clear and moist.  Eyes: EOM are normal.  Neck: Neck supple.  Cardiovascular: Normal rate, regular rhythm and normal heart sounds.   Pulmonary/Chest: She has decreased breath sounds. She has wheezes. She has rhonchi. She has rales.       All fields 1-2+ sounds  Lymphadenopathy:    She has no cervical adenopathy.  Neurological: She is alert and oriented to person, place, and time. Coordination normal.  Skin: Skin is warm and dry.  Psychiatric: She has a normal mood and affect.   Appears well/Active and expressive  PFR 150 l/min Neb Post PFR 200 l/min     Assessment & Plan:  Zithromax 500mg /Lortab/prednisone

## 2012-08-07 ENCOUNTER — Ambulatory Visit (INDEPENDENT_AMBULATORY_CARE_PROVIDER_SITE_OTHER): Payer: Medicare Other | Admitting: Family Medicine

## 2012-08-07 ENCOUNTER — Encounter: Payer: Self-pay | Admitting: Family Medicine

## 2012-08-07 VITALS — BP 166/96 | HR 76 | Temp 97.2°F | Resp 18 | Ht 66.25 in | Wt 135.8 lb

## 2012-08-07 DIAGNOSIS — J449 Chronic obstructive pulmonary disease, unspecified: Secondary | ICD-10-CM

## 2012-08-07 DIAGNOSIS — Z85118 Personal history of other malignant neoplasm of bronchus and lung: Secondary | ICD-10-CM | POA: Diagnosis not present

## 2012-08-07 DIAGNOSIS — C3490 Malignant neoplasm of unspecified part of unspecified bronchus or lung: Secondary | ICD-10-CM

## 2012-08-07 DIAGNOSIS — J4489 Other specified chronic obstructive pulmonary disease: Secondary | ICD-10-CM

## 2012-08-07 DIAGNOSIS — Z124 Encounter for screening for malignant neoplasm of cervix: Secondary | ICD-10-CM | POA: Insufficient documentation

## 2012-08-07 MED ORDER — BUDESONIDE-FORMOTEROL FUMARATE 160-4.5 MCG/ACT IN AERO: 2.0000 | INHALATION_SPRAY | Freq: Two times a day (BID) | RESPIRATORY_TRACT | Status: DC

## 2012-08-07 NOTE — Addendum Note (Signed)
Addended by: Jeoffrey Massed on: 08/07/2012 09:41 AM   Modules accepted: Orders

## 2012-08-07 NOTE — Assessment & Plan Note (Signed)
Disease-free for almost 6 yrs now.  Keep appropriate f/u with Dr. Arbutus Ped.

## 2012-08-07 NOTE — Assessment & Plan Note (Signed)
Plus breast ca and osteoporosis screening. She prefers to do this through GYN office---referral to [facility] in [place] ordered today.

## 2012-08-07 NOTE — Assessment & Plan Note (Signed)
Problem stable.  Continue current medications and diet appropriate for this condition.  We have reviewed our general long term plan for this problem and also reviewed symptoms and signs that should prompt the patient to call or return to the office.  

## 2012-08-07 NOTE — Progress Notes (Signed)
Office Note 08/07/2012  CC:  Chief Complaint  Patient presents with  . Establish Care    HPI:  Leah Howell is a 67 y.o. White female who is here to transfer care. Patient's most recent primary MD: myself and Dr. Milford Cage at [facility] in [place], [place]. Old records were not reviewed prior to or during today's visit.  Feels well, no complaints at all. Most recent onc f/u 01/2012 was reassuring--dz-free. Next f/u scheduled 01/2013, with surveillance CT to be done at that time as well.  Only one event of signif since I last saw her a few years ago and that is a period of "going cross-eyed".  After about 1 mo of this it apparently spontaneously resolved.  She saw an eye MD Nile Riggs) who reassured her. She also ended up getting an MRI brain through Dr. Arbutus Ped to check for brain mets and this was normal.  Her last COPD exac was approx Nov/Dec 2013, at which time she responded well to prednisone and azithromycin. She has had no trouble since then, no need for rescue bronchodilator.  She is compliant with symbicort about 75% of the time but admits that if she feels well certain days she skips it.  Past Medical History  Diagnosis Date  . lung ca dx'd 2008    chemo comp 07/15/07; xrt comp 04/05/07  . COPD (chronic obstructive pulmonary disease)     Past Surgical History  Procedure Laterality Date  . Mediastinoscopy  2008    for bx of lung mass (Dr. Edwyna Shell)    Family History  Problem Relation Age of Onset  . Alcohol abuse Mother   . Heart disease Mother   . Alcohol abuse Father     History   Social History  . Marital Status: Married    Spouse Name: N/A    Number of Children: N/A  . Years of Education: N/A   Occupational History  . Not on file.   Social History Main Topics  . Smoking status: Former Games developer  . Smokeless tobacco: Not on file  . Alcohol Use: Not on file  . Drug Use: Not on file  . Sexually Active: Not on file   Other Topics Concern  . Not on file    Social History Narrative   Married, one daughter.   HS education.  Homemaker.   Former smoker; quit 2004.   No alcohol or drugs.     Exercise: walking    MEDS: Symbicort 160/4.5, 2 puffs bid, Accuneb q6h prn  No Known Allergies  ROS Review of Systems  Constitutional: Negative for fever and fatigue.  HENT: Negative for congestion and sore throat.   Eyes: Negative for visual disturbance.  Respiratory: Negative for cough and shortness of breath.   Cardiovascular: Negative for chest pain.  Gastrointestinal: Negative for nausea and abdominal pain.  Genitourinary: Negative for dysuria and vaginal bleeding.  Musculoskeletal: Negative for back pain and joint swelling.  Skin: Negative for rash.  Neurological: Negative for weakness and headaches.  Hematological: Negative for adenopathy.  Psychiatric/Behavioral: Negative for dysphoric mood. The patient is not nervous/anxious.     PE; Blood pressure 166/96, pulse 76, temperature 97.2 F (36.2 C), temperature source Temporal, resp. rate 18, height 5' 6.25" (1.683 m), weight 135 lb 12 oz (61.576 kg), SpO2 99.00%. Gen: Alert, well appearing.  Patient is oriented to person, place, time, and situation. AFFECT: pleasant, lucid thought and speech. ENT:   Eyes: no injection, icteris, swelling, or exudate.  EOMI, PERRLA. Nose: no  drainage or turbinate edema/swelling.  No injection or focal lesion.  Mouth: lips without lesion/swelling.  Oral mucosa pink and moist.  Upper and lower dentures intact. Oropharynx without erythema, exudate, or swelling.  Neck - No masses or thyromegaly or limitation in range of motion CV: RRR, no m/r/g.   LUNGS: CTA bilat, nonlabored resps, good aeration in all lung fields. EXT: no clubbing, cyanosis, or edema.    Pertinent labs:  none  ASSESSMENT AND PLAN:   Transfer pt: obtain old records.  COPD (chronic obstructive pulmonary disease) Problem stable.  Continue current medications and diet appropriate for  this condition.  We have reviewed our general long term plan for this problem and also reviewed symptoms and signs that should prompt the patient to call or return to the office.   Personal history of lung cancer Disease-free for almost 6 yrs now.  Keep appropriate f/u with Dr. Arbutus Ped.  Cervical cancer screening Plus breast ca and osteoporosis screening. She prefers to do this through GYN office---referral to [facility] in [place] ordered today.  An After Visit Summary was printed and given to the patient.  Return in about 6 months (around 02/07/2013) for f/u copd.

## 2012-10-02 ENCOUNTER — Encounter: Payer: Self-pay | Admitting: Adult Health

## 2012-10-02 ENCOUNTER — Other Ambulatory Visit (HOSPITAL_COMMUNITY)
Admission: RE | Admit: 2012-10-02 | Discharge: 2012-10-02 | Disposition: A | Discharge status: Home / Self Care | Payer: Medicare Other | Source: Ambulatory Visit | Attending: Adult Health | Admitting: Adult Health

## 2012-10-02 ENCOUNTER — Ambulatory Visit (INDEPENDENT_AMBULATORY_CARE_PROVIDER_SITE_OTHER): Payer: Medicare Other | Admitting: Adult Health

## 2012-10-02 VITALS — BP 126/74 | HR 74 | Ht 66.5 in | Wt 132.0 lb

## 2012-10-02 DIAGNOSIS — Z1151 Encounter for screening for human papillomavirus (HPV): Secondary | ICD-10-CM | POA: Diagnosis not present

## 2012-10-02 DIAGNOSIS — Z85118 Personal history of other malignant neoplasm of bronchus and lung: Secondary | ICD-10-CM

## 2012-10-02 DIAGNOSIS — J449 Chronic obstructive pulmonary disease, unspecified: Secondary | ICD-10-CM

## 2012-10-02 DIAGNOSIS — Z124 Encounter for screening for malignant neoplasm of cervix: Secondary | ICD-10-CM

## 2012-10-02 DIAGNOSIS — Z1212 Encounter for screening for malignant neoplasm of rectum: Secondary | ICD-10-CM | POA: Diagnosis not present

## 2012-10-02 DIAGNOSIS — Z01419 Encounter for gynecological examination (general) (routine) without abnormal findings: Secondary | ICD-10-CM | POA: Diagnosis not present

## 2012-10-02 LAB — HEMOCCULT GUIAC POC 1CARD (OFFICE)

## 2012-10-02 NOTE — Progress Notes (Signed)
Patient ID: Leah Howell, female   DOB: 20-Jan-1946, 67 y.o.   MRN: [id number] History of Present Illness: Leah Howell is a 67 year old white female,married in for pap and physical, it has been 20 years since her last pap. She is a 5 year lung cancer survivor. And quit smoking.  Current Medications, Allergies, Past Medical History, Past Surgical History, Family History and Social History were reviewed in [employer] record.    Review of Systems: Patient denies any headaches, blurred vision,chest pain, abdominal pain, problems with bowel movements, urination, or intercourse. She has a history of lung cancer, she got chemo and radiation and had scarring and now has COPD, she is active she push mows her grass, and has no complaints.  Physical Exam:Blood pressure 126/74, pulse 74, height 5' 6.5" (1.689 m), weight 132 lb (59.875 kg). General:  Well developed, well nourished, no acute distress Skin:  Warm and dry, tan Neck:  Midline trachea, normal thyroid, no carotid bruits heard Lungs: has inspiratory and exspiratory wheezing bilaterally and uses an inhaler as needed Breast:  No dominant palpable mass, retraction, or nipple discharge Cardiovascular: Regular rate and rhythm Abdomen:  Soft, non tender, no hepatosplenomegaly Pelvic:  External genitalia is normal in appearance for age.  The vagina is atrophic in appearance, with loss of color, moisture and rugae.   The cervix is atrophic and stenotic at the os. Pap performed with HPV. Uterus is felt to be normal size, shape, and contour.  No adnexal masses or tenderness noted. Rectal: Good sphincter tone, no polyps, or hemorrhoids felt.  Hemoccult negative. Extremities:  No swelling or varicosities noted Psych: Alert and cooperative, seems happy.  Impression: Yearly GYN exam History of lung cancer  COPD  Plan: Pt has never had a mammogram and declines one now Pt has never had a colonoscopy and declines one now Labs with Dr.  Marvel Plan Physical in 1 year, if pap negative for HPV can go 3 years on next pap

## 2012-10-02 NOTE — Patient Instructions (Addendum)
Physical 1 year  Sign up  For my chart

## 2013-01-21 ENCOUNTER — Ambulatory Visit (HOSPITAL_COMMUNITY)
Admission: RE | Admit: 2013-01-21 | Discharge: 2013-01-21 | Disposition: A | Discharge status: Home / Self Care | Payer: Medicare Other | Source: Ambulatory Visit | Attending: Internal Medicine | Admitting: Internal Medicine

## 2013-01-21 ENCOUNTER — Other Ambulatory Visit (HOSPITAL_BASED_OUTPATIENT_CLINIC_OR_DEPARTMENT_OTHER): Payer: Medicare Other | Admitting: Lab

## 2013-01-21 DIAGNOSIS — K7689 Other specified diseases of liver: Secondary | ICD-10-CM | POA: Insufficient documentation

## 2013-01-21 DIAGNOSIS — Z9221 Personal history of antineoplastic chemotherapy: Secondary | ICD-10-CM | POA: Diagnosis not present

## 2013-01-21 DIAGNOSIS — J8410 Pulmonary fibrosis, unspecified: Secondary | ICD-10-CM | POA: Diagnosis not present

## 2013-01-21 DIAGNOSIS — Z85118 Personal history of other malignant neoplasm of bronchus and lung: Secondary | ICD-10-CM

## 2013-01-21 DIAGNOSIS — Z923 Personal history of irradiation: Secondary | ICD-10-CM | POA: Diagnosis not present

## 2013-01-21 DIAGNOSIS — M8448XA Pathological fracture, other site, initial encounter for fracture: Secondary | ICD-10-CM | POA: Diagnosis not present

## 2013-01-21 DIAGNOSIS — I2510 Atherosclerotic heart disease of native coronary artery without angina pectoris: Secondary | ICD-10-CM | POA: Diagnosis not present

## 2013-01-21 DIAGNOSIS — C3490 Malignant neoplasm of unspecified part of unspecified bronchus or lung: Secondary | ICD-10-CM

## 2013-01-21 LAB — COMPREHENSIVE METABOLIC PANEL (CC13)
ALT: 18 U/L (ref 0–55)
AST: 22 U/L (ref 5–34)
CO2: 26 mEq/L (ref 22–29)
Calcium: 9.5 mg/dL (ref 8.4–10.4)
Chloride: 105 mEq/L (ref 98–109)
Sodium: 140 mEq/L (ref 136–145)
Total Protein: 6.7 g/dL (ref 6.4–8.3)

## 2013-01-21 LAB — CBC WITH DIFFERENTIAL/PLATELET
BASO%: 0.7 % (ref 0.0–2.0)
EOS%: 5.5 % (ref 0.0–7.0)
MCH: 30.2 pg (ref 25.1–34.0)
MCHC: 34 g/dL (ref 31.5–36.0)
MONO#: 0.4 10*3/uL (ref 0.1–0.9)
RBC: 4.9 10*6/uL (ref 3.70–5.45)
RDW: 13.4 % (ref 11.2–14.5)
WBC: 2.9 10*3/uL — ABNORMAL LOW (ref 3.9–10.3)
lymph#: 0.8 10*3/uL — ABNORMAL LOW (ref 0.9–3.3)

## 2013-01-21 MED ORDER — IOHEXOL 300 MG/ML  SOLN
100.0000 mL | Freq: Once | INTRAMUSCULAR | Status: AC | PRN
  Administered 2013-01-21: 80 mL via INTRAVENOUS

## 2013-01-23 ENCOUNTER — Ambulatory Visit (HOSPITAL_BASED_OUTPATIENT_CLINIC_OR_DEPARTMENT_OTHER): Payer: Medicare Other | Admitting: Internal Medicine

## 2013-01-23 ENCOUNTER — Telehealth: Payer: Self-pay | Admitting: Internal Medicine

## 2013-01-23 ENCOUNTER — Encounter: Payer: Self-pay | Admitting: Internal Medicine

## 2013-01-23 VITALS — BP 145/75 | HR 72 | Temp 97.0°F | Resp 20 | Ht 66.5 in | Wt 130.8 lb

## 2013-01-23 DIAGNOSIS — C3490 Malignant neoplasm of unspecified part of unspecified bronchus or lung: Secondary | ICD-10-CM | POA: Diagnosis not present

## 2013-01-23 NOTE — Telephone Encounter (Signed)
gv andpritned appt sched and avs forpt for Sept 2015

## 2013-01-23 NOTE — Progress Notes (Signed)
[facility] [phone]   Fax:([phone]) [phone]  OFFICE PROGRESS NOTE  Jeoffrey Massed, MD [address] [place] [address]  PRINCIPAL DIAGNOSIS: Stage IIIA (TX, N2, MX) non-small cell lung cancer, squamous cell carcinoma diagnosed in October 2008.   PRIOR THERAPY:  1. Status post concurrent chemoradiation with weekly carboplatin and paclitaxel. Last dose was given April 05, 2007. 2. Status post 3 cycles of consolidation chemotherapy with docetaxel, last dose was given July 15, 2007.  CURRENT THERAPY: Observation.  INTERVAL HISTORY: Leah Howell 67 y.o. female returns to the clinic today for routine annual followup visit accompanied by her sister. The patient is feeling fine today with no specific complaints. She denied having any significant chest pain, shortness breath, cough or hemoptysis. She denied having any weight loss or night sweats. She has no nausea or vomiting, no fever or chills. The patient had repeat CT scan of the chest performed recently and she is here for evaluation and discussion of her scan results.  MEDICAL HISTORY: Past Medical History  Diagnosis Date  . COPD (chronic obstructive pulmonary disease)   . Cancer     lung    ALLERGIES:  has No Known Allergies.  MEDICATIONS:  Current Outpatient Prescriptions  Medication Sig Dispense Refill  . albuterol (ACCUNEB) 1.25 MG/3ML nebulizer solution Take 1 ampule by nebulization every 6 (six) hours as needed.      . budesonide-formoterol (SYMBICORT) 160-4.5 MCG/ACT inhaler Inhale 2 puffs into the lungs 2 (two) times daily.  1 Inhaler  10   No current facility-administered medications for this visit.    SURGICAL HISTORY:  Past Surgical History  Procedure Laterality Date  . Mediastinoscopy  2008    for bx of lung mass (Dr. Edwyna Shell)  . Tubal ligation      REVIEW OF SYSTEMS:  A comprehensive review of systems was negative.   PHYSICAL EXAMINATION: General  appearance: alert, cooperative and no distress Head: Normocephalic, without obvious abnormality, atraumatic Neck: no adenopathy Lymph nodes: Cervical, supraclavicular, and axillary nodes normal. Resp: clear to auscultation bilaterally Cardio: regular rate and rhythm, S1, S2 normal, no murmur, click, rub or gallop GI: soft, non-tender; bowel sounds normal; no masses,  no organomegaly Extremities: extremities normal, atraumatic, no cyanosis or edema  ECOG PERFORMANCE STATUS: 0 - Asymptomatic  Blood pressure 145/75, pulse 72, temperature 97 F (36.1 C), temperature source Oral, resp. rate 20, height 5' 6.5" (1.689 m), weight 130 lb 12.8 oz (59.33 kg).  LABORATORY DATA: Lab Results  Component Value Date   WBC 2.9* 01/21/2013   HGB 14.8 01/21/2013   HCT 43.6 01/21/2013   MCV 88.9 01/21/2013   PLT 159 01/21/2013      Chemistry      Component Value Date/Time   NA 140 01/21/2013 0802   NA 140 07/21/2011 0920   NA 139 07/21/2010 1207   K 4.0 01/21/2013 0802   K 3.7 07/21/2011 0920   K 4.3 07/21/2010 1207   CL 105 01/22/2012 0830   CL 98 07/21/2011 0920   CL 104 07/21/2010 1207   CO2 26 01/21/2013 0802   CO2 29 07/21/2011 0920   CO2 24 07/21/2010 1207   BUN 13.7 01/21/2013 0802   BUN 15 07/21/2011 0920   BUN 17 07/21/2010 1207   CREATININE 1.0 01/21/2013 0802   CREATININE 1.0 07/21/2011 0920   CREATININE 1.00 07/21/2010 1207      Component Value Date/Time   CALCIUM 9.5 01/21/2013 0802  CALCIUM 9.1 07/21/2011 0920   CALCIUM 9.2 07/21/2010 1207   ALKPHOS 74 01/21/2013 0802   ALKPHOS 81 07/21/2011 0920   ALKPHOS 84 07/21/2010 1207   AST 22 01/21/2013 0802   AST 24 07/21/2011 0920   AST 19 07/21/2010 1207   ALT 18 01/21/2013 0802   ALT 21 07/21/2011 0920   ALT 12 07/21/2010 1207   BILITOT 0.53 01/21/2013 0802   BILITOT 0.50 07/21/2011 0920   BILITOT 0.5 07/21/2010 1207       RADIOGRAPHIC STUDIES: Ct Chest W Contrast  01/21/2013   CLINICAL DATA:  Lung cancer, prior chemo and radiation.  EXAM: CT CHEST WITH CONTRAST  TECHNIQUE:  Multidetector CT imaging of the chest was performed during intravenous contrast administration.  CONTRAST:  80mL OMNIPAQUE IOHEXOL 300 MG/ML  SOLN  COMPARISON:  01/22/2012  FINDINGS: Post treatment changes on the right. Areas of scarring, fibrosis and volume loss in the right upper and middle lobes with associated air bronchograms, stable since prior study. Retraction of the right hilum superiorly. No mediastinal, hilar, or axillary adenopathy. No suspicious or new pulmonary nodules. No pleural effusions. Heart is normal size. Coronary artery calcifications are noted, including at the origin of the left main coronary artery.  Visualized thyroid and chest wall soft tissues unremarkable. Imaging into the upper abdomen again demonstrates a numerous low-density lesions throughout the liver, stable.  Mild progression of the T11 compression fracture. Stable mild appearance of the manubrium and medial clavicles bilaterally, likely related to prior radiation change.  IMPRESSION: Post treatment changes within the right upper lobe, stable.  No acute findings or evidence of recurrent disease.  Coronary artery disease.   Electronically Signed   By: Charlett Nose M.D.   On: 01/21/2013 10:10    ASSESSMENT AND PLAN: This is a very pleasant 67 years old white female with history of stage IIIa non-small cell lung cancer status post concurrent chemoradiation followed by consolidation chemotherapy and has been observation since March of 2009 with no evidence for disease recurrence.  I discussed the scan results with the patient and her sister.  I recommended for her to continue on observation with repeat CT scan of the chest without contrast in one year. The patient was advised to call immediately if she has any concerning symptoms in the interval.  The patient voices understanding of current disease status and treatment options and is in agreement with the current care plan.  All questions were answered. The patient knows to  call the clinic with any problems, questions or concerns. We can certainly see the patient much sooner if necessary.

## 2013-02-06 ENCOUNTER — Encounter: Payer: Self-pay | Admitting: Family Medicine

## 2013-02-06 ENCOUNTER — Ambulatory Visit (INDEPENDENT_AMBULATORY_CARE_PROVIDER_SITE_OTHER): Payer: Medicare Other | Admitting: Family Medicine

## 2013-02-06 VITALS — BP 144/80 | HR 68 | Temp 98.0°F | Resp 16 | Ht 66.25 in | Wt 131.0 lb

## 2013-02-06 DIAGNOSIS — Z124 Encounter for screening for malignant neoplasm of cervix: Secondary | ICD-10-CM

## 2013-02-06 DIAGNOSIS — Z85118 Personal history of other malignant neoplasm of bronchus and lung: Secondary | ICD-10-CM | POA: Diagnosis not present

## 2013-02-06 DIAGNOSIS — J449 Chronic obstructive pulmonary disease, unspecified: Secondary | ICD-10-CM

## 2013-02-06 DIAGNOSIS — C3490 Malignant neoplasm of unspecified part of unspecified bronchus or lung: Secondary | ICD-10-CM

## 2013-02-06 DIAGNOSIS — Z23 Encounter for immunization: Secondary | ICD-10-CM | POA: Diagnosis not present

## 2013-02-06 DIAGNOSIS — Z1211 Encounter for screening for malignant neoplasm of colon: Secondary | ICD-10-CM

## 2013-02-06 MED ORDER — BUDESONIDE-FORMOTEROL FUMARATE 160-4.5 MCG/ACT IN AERO: 2.0000 | INHALATION_SPRAY | Freq: Two times a day (BID) | RESPIRATORY_TRACT | Status: DC

## 2013-02-06 NOTE — Assessment & Plan Note (Signed)
Need to get records from GYN visit done within the last 78mo. She declines mammogram and DEXA (again).

## 2013-02-06 NOTE — Assessment & Plan Note (Signed)
Stable. Continue symbicort 160/4.5, 2 puffs bid. Accuneb q4h prn. Flu vaccine IM today. She declined pneumovax and Tdap today (again).

## 2013-02-06 NOTE — Assessment & Plan Note (Signed)
She repeatedly declines colonoscopy screening. At next f/u in 57mo for her CPE will offer iFOB (or cologuard testing if it is available by that time).

## 2013-02-06 NOTE — Patient Instructions (Addendum)
Take one womens multivitamin per day (for the vit D)

## 2013-02-06 NOTE — Assessment & Plan Note (Signed)
6+ yrs remission. Most recent oncology f/u and chest CT surveillance about 2 wks ago--NORMAL. Annual f/u with Dr. Shirline Frees in oncology is planned.

## 2013-02-06 NOTE — Progress Notes (Signed)
OFFICE NOTE  02/06/2013  CC:  Chief Complaint  Patient presents with  . Follow-up    HPI: Patient is a 67 y.o. Caucasian female who is here for 6 mo f/u COPD. Feeling well.  Compliant with symbicort. She has had cervical cancer screening w/in last 6 mo and this was apparently normal as she has not heard anything otherwise from GYN office.  Recent Oncologist f/u revealed no sign of lung cancer recurrence on chest CT, CBC normal except stable low WBC count. She push-mows her yard weekly and only has some breathing issues on humid/hot days. She has declined to get mammogram or colonoscopy. She has declined bone densitometry testing.  Pertinent PMH:  Past Medical History  Diagnosis Date  . COPD (chronic obstructive pulmonary disease)   . Personal history of lung cancer     2008   Past Surgical History  Procedure Laterality Date  . Mediastinoscopy  2008    for bx of lung mass (Dr. Edwyna Shell)  . Tubal ligation     History   Social History Narrative   Married, one daughter.   HS education.  Homemaker.   Former smoker; quit 2004.   No alcohol or drugs.     Exercise: walking     MEDS:  Outpatient Prescriptions Prior to Visit  Medication Sig Dispense Refill  . albuterol (ACCUNEB) 1.25 MG/3ML nebulizer solution Take 1 ampule by nebulization every 6 (six) hours as needed.      . budesonide-formoterol (SYMBICORT) 160-4.5 MCG/ACT inhaler Inhale 2 puffs into the lungs 2 (two) times daily.  1 Inhaler  10   No facility-administered medications prior to visit.  She takes 3 tums per day, OTC pot and vit B12  PE: Blood pressure 144/80, pulse 68, temperature 98 F (36.7 C), temperature source Temporal, resp. rate 16, height 5' 6.25" (1.683 m), weight 131 lb (59.421 kg), SpO2 100.00%. Gen: Alert, well appearing.  Patient is oriented to person, place, time, and situation. CV: RRR, no m/r/g.   LUNGS: CTA bilat, nonlabored resps, good aeration in all lung fields. EXT: no clubbing,  cyanosis, or edema.    IMPRESSION AND PLAN:  CHRONIC OBSTRUCTIVE PULMONARY DISEASE, MODERATE Stable. Continue symbicort 160/4.5, 2 puffs bid. Accuneb q4h prn. Flu vaccine IM today. She declined pneumovax and Tdap today (again).  Cervical cancer screening Need to get records from GYN visit done within the last 28mo. She declines mammogram and DEXA (again).  Personal history of lung cancer 6+ yrs remission. Most recent oncology f/u and chest CT surveillance about 2 wks ago--NORMAL. Annual f/u with Dr. Shirline Frees in oncology is planned.  Colon cancer screening She repeatedly declines colonoscopy screening. At next f/u in 28mo for her CPE will offer iFOB (or cologuard testing if it is available by that time).   An After Visit Summary was printed and given to the patient.  FOLLOW UP: 28mo for fasting CPE

## 2013-08-06 ENCOUNTER — Ambulatory Visit (INDEPENDENT_AMBULATORY_CARE_PROVIDER_SITE_OTHER): Payer: Medicare Other | Admitting: Family Medicine

## 2013-08-06 ENCOUNTER — Encounter: Payer: Self-pay | Admitting: Family Medicine

## 2013-08-06 VITALS — BP 125/77 | HR 77 | Temp 97.5°F | Resp 18 | Ht 66.25 in | Wt 124.0 lb

## 2013-08-06 DIAGNOSIS — J449 Chronic obstructive pulmonary disease, unspecified: Secondary | ICD-10-CM

## 2013-08-06 DIAGNOSIS — Z23 Encounter for immunization: Secondary | ICD-10-CM | POA: Diagnosis not present

## 2013-08-06 DIAGNOSIS — Z0000 Encounter for general adult medical examination without abnormal findings: Secondary | ICD-10-CM

## 2013-08-06 MED ORDER — ALBUTEROL SULFATE 1.25 MG/3ML IN NEBU: 1.0000 | INHALATION_SOLUTION | Freq: Four times a day (QID) | RESPIRATORY_TRACT | Status: DC | PRN

## 2013-08-06 NOTE — Progress Notes (Signed)
The patient is here for annual Medicare wellness examination and management of other chronic and acute problems.   The risk factors are reflected in the social history.  The roster of all physicians providing medical care to patient - is listed in the Snapshot section of the chart.  Activities of daily living:  The patient is 100% inedpendent in all ADLs: dressing, toileting, feeding as well as independent mobility  Home safety : The patient has smoke detectors in the home. They wear seatbelts.No firearms at home ( firearms are present in the home, kept in a safe fashion). There is no violence in the home.   There is no risks for hepatitis, STDs or HIV. There is no   history of blood transfusion. They have no travel history to infectious disease endemic areas of the world.  The patient has notseen their dentist in the last six month-has dentures. They have seen their eye doctor in the last year. They deny any hearing difficulty and have not had audiologic testing in the last year.  They do not  have excessive sun exposure. Discussed the need for sun protection: hats, long sleeves and use of sunscreen if there is significant sun exposure.   Diet: the importance of a healthy diet is discussed. They do have a healthy (unhealthy-high fat/fast food) diet.  The patient has a regular exercise program: no, due to weather, but has plans for restarting this.  The benefits of regular aerobic exercise were discussed.  Depression screen: there are no signs or vegative symptoms of depression- irritability, change in appetite, anhedonia, sadness/tearfullness.  Cognitive assessment: the patient manages all their financial and personal affairs and is actively engaged. They could relate day,date,year and events; recalled 3/3 objects at 3 minutes; performed clock-face test normally.  The following portions of the patient's history were reviewed and updated as appropriate: allergies, current medications, past  family history, past medical history,  past surgical history, past social history  and problem list.  Vision, hearing, body mass index were assessed and reviewed.   During the course of the visit the patient was educated and counseled about appropriate screening and preventive services including : fall prevention , diabetes screening, nutrition counseling, colorectal cancer screening, and recommended immunizations.  She accepted Prevnar 13 vaccine today.  She declined varivax (citing "already had shingles twice and don't want the vaccine").  She reports last Td was about 2012. She declined colon cancer screening and mammogram. Pap is UTD (2014, normal--[facility]--per pt report today). Declined bone density testing.  Reiterated calcium and vit D supplementation recommendations.  F/u 28mo --COPD.

## 2013-08-06 NOTE — Progress Notes (Signed)
Pre visit review using our clinic review tool, if applicable. No additional management support is needed unless otherwise documented below in the visit note. 

## 2013-08-07 ENCOUNTER — Telehealth: Payer: Self-pay | Admitting: Family Medicine

## 2013-08-07 NOTE — Telephone Encounter (Signed)
Relevant patient education mailed to patient.  

## 2014-01-20 ENCOUNTER — Other Ambulatory Visit (HOSPITAL_BASED_OUTPATIENT_CLINIC_OR_DEPARTMENT_OTHER): Payer: Medicare Other

## 2014-01-20 ENCOUNTER — Ambulatory Visit (HOSPITAL_COMMUNITY)
Admission: RE | Admit: 2014-01-20 | Discharge: 2014-01-20 | Disposition: A | Discharge status: Home / Self Care | Payer: Medicare Other | Source: Ambulatory Visit | Attending: Internal Medicine | Admitting: Internal Medicine

## 2014-01-20 DIAGNOSIS — R911 Solitary pulmonary nodule: Secondary | ICD-10-CM | POA: Diagnosis not present

## 2014-01-20 DIAGNOSIS — Z85118 Personal history of other malignant neoplasm of bronchus and lung: Secondary | ICD-10-CM | POA: Diagnosis not present

## 2014-01-20 DIAGNOSIS — R937 Abnormal findings on diagnostic imaging of other parts of musculoskeletal system: Secondary | ICD-10-CM | POA: Diagnosis not present

## 2014-01-20 DIAGNOSIS — Z923 Personal history of irradiation: Secondary | ICD-10-CM | POA: Diagnosis not present

## 2014-01-20 DIAGNOSIS — I700 Atherosclerosis of aorta: Secondary | ICD-10-CM | POA: Insufficient documentation

## 2014-01-20 DIAGNOSIS — C3490 Malignant neoplasm of unspecified part of unspecified bronchus or lung: Secondary | ICD-10-CM

## 2014-01-20 DIAGNOSIS — I2510 Atherosclerotic heart disease of native coronary artery without angina pectoris: Secondary | ICD-10-CM | POA: Diagnosis not present

## 2014-01-20 DIAGNOSIS — K7689 Other specified diseases of liver: Secondary | ICD-10-CM | POA: Insufficient documentation

## 2014-01-20 LAB — CBC WITH DIFFERENTIAL/PLATELET
BASO%: 0.3 % (ref 0.0–2.0)
BASOS ABS: 0 10*3/uL (ref 0.0–0.1)
EOS ABS: 0.1 10*3/uL (ref 0.0–0.5)
EOS%: 3.8 % (ref 0.0–7.0)
HCT: 43.2 % (ref 34.8–46.6)
HEMOGLOBIN: 14.3 g/dL (ref 11.6–15.9)
LYMPH%: 22.9 % (ref 14.0–49.7)
MCH: 31.3 pg (ref 25.1–34.0)
MCHC: 33.1 g/dL (ref 31.5–36.0)
MCV: 94.5 fL (ref 79.5–101.0)
MONO#: 0.4 10*3/uL (ref 0.1–0.9)
MONO%: 12.7 % (ref 0.0–14.0)
NEUT%: 60.3 % (ref 38.4–76.8)
NEUTROS ABS: 1.9 10*3/uL (ref 1.5–6.5)
Platelets: 156 10*3/uL (ref 145–400)
RBC: 4.57 10*6/uL (ref 3.70–5.45)
RDW: 13.6 % (ref 11.2–14.5)
WBC: 3.2 10*3/uL — ABNORMAL LOW (ref 3.9–10.3)
lymph#: 0.7 10*3/uL — ABNORMAL LOW (ref 0.9–3.3)

## 2014-01-20 LAB — COMPREHENSIVE METABOLIC PANEL (CC13)
ALBUMIN: 3.7 g/dL (ref 3.5–5.0)
ALK PHOS: 65 U/L (ref 40–150)
ALT: 14 U/L (ref 0–55)
ANION GAP: 8 meq/L (ref 3–11)
AST: 20 U/L (ref 5–34)
BUN: 14.3 mg/dL (ref 7.0–26.0)
CALCIUM: 9.2 mg/dL (ref 8.4–10.4)
CHLORIDE: 106 meq/L (ref 98–109)
CO2: 27 mEq/L (ref 22–29)
Creatinine: 0.9 mg/dL (ref 0.6–1.1)
GLUCOSE: 99 mg/dL (ref 70–140)
POTASSIUM: 4.1 meq/L (ref 3.5–5.1)
Sodium: 140 mEq/L (ref 136–145)
Total Bilirubin: 0.44 mg/dL (ref 0.20–1.20)
Total Protein: 6.3 g/dL — ABNORMAL LOW (ref 6.4–8.3)

## 2014-01-22 ENCOUNTER — Encounter: Payer: Self-pay | Admitting: Internal Medicine

## 2014-01-22 ENCOUNTER — Ambulatory Visit (HOSPITAL_BASED_OUTPATIENT_CLINIC_OR_DEPARTMENT_OTHER): Payer: Medicare Other | Admitting: Internal Medicine

## 2014-01-22 ENCOUNTER — Telehealth: Payer: Self-pay | Admitting: Internal Medicine

## 2014-01-22 VITALS — BP 133/60 | HR 73 | Temp 98.1°F | Resp 18 | Ht 66.25 in | Wt 126.2 lb

## 2014-01-22 DIAGNOSIS — C3490 Malignant neoplasm of unspecified part of unspecified bronchus or lung: Secondary | ICD-10-CM

## 2014-01-22 DIAGNOSIS — Z85118 Personal history of other malignant neoplasm of bronchus and lung: Secondary | ICD-10-CM | POA: Diagnosis not present

## 2014-01-22 NOTE — Progress Notes (Signed)
[place] Telephone:([phone]   Fax:([phone]  OFFICE PROGRESS NOTE  Tammi Sou, MD [address] [place] [address]  PRINCIPAL DIAGNOSIS: Stage IIIA (TX, N2, MX) non-small cell lung cancer, squamous cell carcinoma diagnosed in October 2008.   PRIOR THERAPY:  1. Status post concurrent chemoradiation with weekly carboplatin and paclitaxel. Last dose was given April 05, 2007. 2. Status post 3 cycles of consolidation chemotherapy with docetaxel, last dose was given July 15, 2007.  CURRENT THERAPY: Observation.  INTERVAL HISTORY: Leah Howell 68 y.o. female returns to the clinic today for routine annual followup visit accompanied by her sister. The patient has been on observation for the last 7 years. She is feeling fine today with no specific complaints. She denied having any significant chest pain, shortness breath, cough or hemoptysis. She denied having any weight loss or night sweats. She has no nausea or vomiting, no fever or chills. The patient had repeat CT scan of the chest performed recently and she is here for evaluation and discussion of her scan results.  MEDICAL HISTORY: Past Medical History  Diagnosis Date  . COPD (chronic obstructive pulmonary disease)   . Personal history of lung cancer     2008    ALLERGIES:  has No Known Allergies.  MEDICATIONS:  Current Outpatient Prescriptions  Medication Sig Dispense Refill  . albuterol (ACCUNEB) 1.25 MG/3ML nebulizer solution Take 3 mLs (1.25 mg total) by nebulization every 6 (six) hours as needed.  75 mL  6  . budesonide-formoterol (SYMBICORT) 160-4.5 MCG/ACT inhaler Inhale 2 puffs into the lungs 2 (two) times daily.  1 Inhaler  10   No current facility-administered medications for this visit.    SURGICAL HISTORY:  Past Surgical History  Procedure Laterality Date  . Mediastinoscopy  2008    for bx of lung mass (Dr. Arlyce Dice)  . Tubal ligation      REVIEW OF  SYSTEMS:  A comprehensive review of systems was negative.   PHYSICAL EXAMINATION: General appearance: alert, cooperative and no distress Head: Normocephalic, without obvious abnormality, atraumatic Neck: no adenopathy Lymph nodes: Cervical, supraclavicular, and axillary nodes normal. Resp: clear to auscultation bilaterally Cardio: regular rate and rhythm, S1, S2 normal, no murmur, click, rub or gallop GI: soft, non-tender; bowel sounds normal; no masses,  no organomegaly Extremities: extremities normal, atraumatic, no cyanosis or edema  ECOG PERFORMANCE STATUS: 0 - Asymptomatic  Blood pressure 133/60, pulse 73, temperature 98.1 F (36.7 C), temperature source Oral, resp. rate 18, height 5' 6.25" (1.683 m), weight 126 lb 3.2 oz (57.244 kg), SpO2 100.00%.  LABORATORY DATA: Lab Results  Component Value Date   WBC 3.2* 01/20/2014   HGB 14.3 01/20/2014   HCT 43.2 01/20/2014   MCV 94.5 01/20/2014   PLT 156 01/20/2014      Chemistry      Component Value Date/Time   NA 140 01/20/2014 0811   NA 140 07/21/2011 0920   NA 139 07/21/2010 1207   K 4.1 01/20/2014 0811   K 3.7 07/21/2011 0920   K 4.3 07/21/2010 1207   CL 105 01/22/2012 0830   CL 98 07/21/2011 0920   CL 104 07/21/2010 1207   CO2 27 01/20/2014 0811   CO2 29 07/21/2011 0920   CO2 24 07/21/2010 1207   BUN 14.3 01/20/2014 0811   BUN 15 07/21/2011 0920   BUN 17 07/21/2010 1207   CREATININE 0.9 01/20/2014 0811   CREATININE 1.0 07/21/2011 0920  CREATININE 1.00 07/21/2010 1207      Component Value Date/Time   CALCIUM 9.2 01/20/2014 0811   CALCIUM 9.1 07/21/2011 0920   CALCIUM 9.2 07/21/2010 1207   ALKPHOS 65 01/20/2014 0811   ALKPHOS 81 07/21/2011 0920   ALKPHOS 84 07/21/2010 1207   AST 20 01/20/2014 0811   AST 24 07/21/2011 0920   AST 19 07/21/2010 1207   ALT 14 01/20/2014 0811   ALT 21 07/21/2011 0920   ALT 12 07/21/2010 1207   BILITOT 0.44 01/20/2014 0811   BILITOT 0.50 07/21/2011 0920   BILITOT 0.5 07/21/2010 1207       RADIOGRAPHIC STUDIES: Ct Chest Wo Contrast  01/20/2014    CLINICAL DATA:  History of lung cancer.  EXAM: CT CHEST WITHOUT CONTRAST  TECHNIQUE: Multidetector CT imaging of the chest was performed following the standard protocol without IV contrast.  COMPARISON:  Chest CT 01/21/2013.  FINDINGS: Mediastinum: Heart size is normal. There is no significant pericardial fluid, thickening or pericardial calcification. There is atherosclerosis of the thoracic aorta, the great vessels of the mediastinum and the coronary arteries, including calcified atherosclerotic plaque in the left main and left anterior descending coronary arteries. Slight rightward shift of cardiomediastinal structures is chronic and related to chronic right-sided volume loss (unchanged). No pathologically enlarged mediastinal or hilar lymph nodes. Please note that accurate exclusion of hilar adenopathy is limited on noncontrast CT scans. Esophagus is unremarkable in appearance.  Lungs/Pleura: Postradiation changes are again noted in the right lung with complete chronic collapse of the right middle lobe and extensive post radiation fibrosis and volume loss in the right upper lobe where there is some associated cylindrical and varicose bronchiectasis. Compensatory hyperexpansion of the right lower lobe. 3 mm subpleural nodule in the right lower lobe (image 23 of series 5), unchanged compared to prior study 01/18/2011; this can be considered benign and does not require imaging followup. No other new suspicious appearing pulmonary nodules or masses are otherwise noted. No acute consolidative airspace disease. Trace chronic pleural effusion in the right apex is unchanged.  Upper Abdomen: Innumerable sub cm low-attenuation lesions are again seen scattered throughout the hepatic parenchyma, similar to numerous prior examinations; although these are incompletely characterized on today's noncontrast CT examination, these are compatible with small cysts or biliary hamartomas given their stability over time.  Atherosclerosis.  Musculoskeletal: Again noted is a mixed area of lysis and sclerosis in the manubrium and upper sternum, similar to prior examinations, favored to reflect post radiation changes. No other aggressive appearing lytic or blastic lesions are noted in the visualized portions of the skeleton. Unchanged compression fracture at T11 with approximately 30% loss of anterior vertebral body height.  IMPRESSION: 1. Stable examination demonstrating posttreatment related changes of prior radiation therapy in the right hemithorax, as above. No definite signs to suggest local recurrence of disease or metastatic disease in the thorax. 2. Atherosclerosis, including left main and left anterior descending coronary artery disease. Please note that although the presence of coronary artery calcium documents the presence of coronary artery disease, the severity of this disease and any potential stenosis cannot be assessed on this non-gated CT examination. Assessment for potential risk factor modification, dietary therapy or pharmacologic therapy may be warranted, if clinically indicated. 3. Additional incidental findings, as above.   Electronically Signed   By: Vinnie Langton M.D.   On: 01/20/2014 09:07   ASSESSMENT AND PLAN: This is a very pleasant 68 years old white female with history of stage IIIa non-small cell  lung cancer status post concurrent chemoradiation followed by consolidation chemotherapy and has been observation since March of 2009 with no evidence for disease recurrence.  Her recent CT scan of the chest showed no evidence for disease progression. I discussed the scan results with the patient and her sister. I recommended for her to continue on observation with repeat CT scan of the chest without contrast in one year. The patient was advised to call immediately if she has any concerning symptoms in the interval.  The patient voices understanding of current disease status and treatment options and is  in agreement with the current care plan.  All questions were answered. The patient knows to call the clinic with any problems, questions or concerns. We can certainly see the patient much sooner if necessary.  Disclaimer: This note was dictated with voice recognition software. Similar sounding words can inadvertently be transcribed and may be missed upon review.

## 2014-01-22 NOTE — Telephone Encounter (Signed)
Pt confirmed labs/ov per 09/10 POF, gave pt AVS....KJ °

## 2014-02-05 ENCOUNTER — Ambulatory Visit (INDEPENDENT_AMBULATORY_CARE_PROVIDER_SITE_OTHER): Payer: Medicare Other | Admitting: Family Medicine

## 2014-02-05 ENCOUNTER — Encounter: Payer: Self-pay | Admitting: Family Medicine

## 2014-02-05 VITALS — BP 159/76 | HR 74 | Temp 97.5°F | Resp 18 | Ht 66.25 in | Wt 124.0 lb

## 2014-02-05 DIAGNOSIS — Z23 Encounter for immunization: Secondary | ICD-10-CM

## 2014-02-05 DIAGNOSIS — Z13220 Encounter for screening for lipoid disorders: Secondary | ICD-10-CM | POA: Diagnosis not present

## 2014-02-05 DIAGNOSIS — C3490 Malignant neoplasm of unspecified part of unspecified bronchus or lung: Secondary | ICD-10-CM | POA: Diagnosis not present

## 2014-02-05 DIAGNOSIS — J449 Chronic obstructive pulmonary disease, unspecified: Secondary | ICD-10-CM | POA: Diagnosis not present

## 2014-02-05 LAB — LIPID PANEL
CHOLESTEROL: 258 mg/dL — AB (ref 0–200)
HDL: 81.9 mg/dL (ref >39.00)
LDL Cholesterol: 147 mg/dL — ABNORMAL HIGH (ref 0–99)
NonHDL: 176.1
TRIGLYCERIDES: 148 mg/dL (ref 0.0–149.0)
Total CHOL/HDL Ratio: 3
VLDL: 29.6 mg/dL (ref 0.0–40.0)

## 2014-02-05 MED ORDER — BUDESONIDE-FORMOTEROL FUMARATE 160-4.5 MCG/ACT IN AERO: 2.0000 | INHALATION_SPRAY | Freq: Two times a day (BID) | RESPIRATORY_TRACT | Status: DC

## 2014-02-05 NOTE — Progress Notes (Signed)
Pre visit review using our clinic review tool, if applicable. No additional management support is needed unless otherwise documented below in the visit note. 

## 2014-02-05 NOTE — Progress Notes (Signed)
OFFICE VISIT  02/05/2014   CC:  Chief Complaint  Patient presents with  . Follow-up   HPI:    Patient is a 68 y.o. Caucasian female who presents for 6 mo f/u COPD.   Doing well.  Saw onc yest for routine annual lung ca f/u and all was good. Compliant with symbicort, RARELY uses albuterol. She is fasting today.  ROS: no chest pain, no SOB, no dizziness, no palpitations, no fevers, no hemoptysis.   Past Medical History  Diagnosis Date  . COPD (chronic obstructive pulmonary disease)   . Personal history of lung cancer     2008    Past Surgical History  Procedure Laterality Date  . Mediastinoscopy  2008    for bx of lung mass (Dr. Arlyce Dice)  . Tubal ligation      Outpatient Prescriptions Prior to Visit  Medication Sig Dispense Refill  . albuterol (ACCUNEB) 1.25 MG/3ML nebulizer solution Take 3 mLs (1.25 mg total) by nebulization every 6 (six) hours as needed.  75 mL  6  . budesonide-formoterol (SYMBICORT) 160-4.5 MCG/ACT inhaler Inhale 2 puffs into the lungs 2 (two) times daily.  1 Inhaler  10   No facility-administered medications prior to visit.    No Known Allergies  ROS As per HPI  PE: Blood pressure 159/76, pulse 74, temperature 97.5 F (36.4 C), temperature source Oral, resp. rate 18, height 5' 6.25" (1.683 m), weight 124 lb (56.246 kg), SpO2 99.00%. Gen: Alert, well appearing.  Patient is oriented to person, place, time, and situation. CV: RRR, no m/r/g LUNGS: CTA bilat, nonlabored resps--breath sounds a bit diminished throughout all lung fields c/w her usual exam.  LABS:  None today Recent: Lab Results  Component Value Date   WBC 3.2* 01/20/2014   HGB 14.3 01/20/2014   HCT 43.2 01/20/2014   MCV 94.5 01/20/2014   PLT 156 01/20/2014     Chemistry      Component Value Date/Time   NA 140 01/20/2014 0811   NA 140 07/21/2011 0920   NA 139 07/21/2010 1207   K 4.1 01/20/2014 0811   K 3.7 07/21/2011 0920   K 4.3 07/21/2010 1207   CL 105 01/22/2012 0830   CL 98 07/21/2011 0920    CL 104 07/21/2010 1207   CO2 27 01/20/2014 0811   CO2 29 07/21/2011 0920   CO2 24 07/21/2010 1207   BUN 14.3 01/20/2014 0811   BUN 15 07/21/2011 0920   BUN 17 07/21/2010 1207   CREATININE 0.9 01/20/2014 0811   CREATININE 1.0 07/21/2011 0920   CREATININE 1.00 07/21/2010 1207      Component Value Date/Time   CALCIUM 9.2 01/20/2014 0811   CALCIUM 9.1 07/21/2011 0920   CALCIUM 9.2 07/21/2010 1207   ALKPHOS 65 01/20/2014 0811   ALKPHOS 81 07/21/2011 0920   ALKPHOS 84 07/21/2010 1207   AST 20 01/20/2014 0811   AST 24 07/21/2011 0920   AST 19 07/21/2010 1207   ALT 14 01/20/2014 0811   ALT 21 07/21/2011 0920   ALT 12 07/21/2010 1207   BILITOT 0.44 01/20/2014 0811   BILITOT 0.50 07/21/2011 0920   BILITOT 0.5 07/21/2010 1207       IMPRESSION AND PLAN:  1) COPD; stable.  The current medical regimen is effective;  continue present plan and medications. Flu vaccine IM today.  2) Hx of lung cancer: recent annual f/u with oncologist was good.  3) Screening for hyperlipidemia: FLP today.  4) Elevated bp w/out dx of HTN:  she will check bp out of office and report back if >140/90.  Her bp has historically been low normal or normal.  An After Visit Summary was printed and given to the patient.  FOLLOW UP: 6 mo

## 2014-03-16 ENCOUNTER — Encounter: Payer: Self-pay | Admitting: Family Medicine

## 2014-08-05 ENCOUNTER — Ambulatory Visit: Payer: Medicare Other | Admitting: Family Medicine

## 2014-08-07 ENCOUNTER — Ambulatory Visit: Payer: Medicare Other | Admitting: Family Medicine

## 2014-08-12 ENCOUNTER — Ambulatory Visit (INDEPENDENT_AMBULATORY_CARE_PROVIDER_SITE_OTHER): Payer: Medicare Other | Admitting: Family Medicine

## 2014-08-12 ENCOUNTER — Encounter: Payer: Self-pay | Admitting: Family Medicine

## 2014-08-12 VITALS — BP 160/88 | HR 68 | Temp 97.6°F | Ht 66.25 in | Wt 128.0 lb

## 2014-08-12 DIAGNOSIS — R030 Elevated blood-pressure reading, without diagnosis of hypertension: Secondary | ICD-10-CM | POA: Diagnosis not present

## 2014-08-12 DIAGNOSIS — J438 Other emphysema: Secondary | ICD-10-CM | POA: Diagnosis not present

## 2014-08-12 MED ORDER — BUDESONIDE-FORMOTEROL FUMARATE 160-4.5 MCG/ACT IN AERO: 2.0000 | INHALATION_SPRAY | Freq: Two times a day (BID) | RESPIRATORY_TRACT | Status: DC

## 2014-08-12 NOTE — Progress Notes (Signed)
OFFICE VISIT  24-Jun-202016   CC:  Chief Complaint  Patient presents with  . Follow-up   HPI:    Patient is a 69 y.o. Caucasian female who presents for 6 mo f/u COPD, hx of lung cancer. Feeling well, rarely has to use any albuterol rescue med. Compliant with symbicort as rx'd. No acute complaints. She did not do any home bp monitoring after last visit like I had asked her to do. She declines DEXA scan for osteoporosis screening b/c " i won't take any meds for it even if I do have osteoporosis, so why do the test".  ROS: no HAs, vision complaints, dizziness, fatigue, or palpitations.  Past Medical History  Diagnosis Date  . COPD (chronic obstructive pulmonary disease)   . Personal history of lung cancer     2008    Past Surgical History  Procedure Laterality Date  . Mediastinoscopy  2008    for bx of lung mass (Dr. Arlyce Dice)  . Tubal ligation      Outpatient Prescriptions Prior to Visit  Medication Sig Dispense Refill  . albuterol (ACCUNEB) 1.25 MG/3ML nebulizer solution Take 3 mLs (1.25 mg total) by nebulization every 6 (six) hours as needed. 75 mL 6  . budesonide-formoterol (SYMBICORT) 160-4.5 MCG/ACT inhaler Inhale 2 puffs into the lungs 2 (two) times daily. 1 Inhaler 10   No facility-administered medications prior to visit.    No Known Allergies  ROS As per HPI  PE: Blood pressure 160/88, pulse 68, temperature 97.6 F (36.4 C), temperature source Temporal, height 5' 6.25" (1.683 m), weight 128 lb (58.06 kg), SpO2 100 %. Gen: Alert, well appearing.  Patient is oriented to person, place, time, and situation. AFFECT: pleasant, lucid thought and speech. CV: RRR, no m/r/g.   LUNGS: CTA bilat, nonlabored resps, good aeration in all lung fields. EXT: no clubbing, cyanosis, or edema.    LABS:  Lab Results  Component Value Date   CHOL 258* 02/05/2014   HDL 81.90 02/05/2014   LDLCALC 147* 02/05/2014   TRIG 148.0 02/05/2014   CHOLHDL 3 02/05/2014  (planned for  repeat 1 yr)   IMPRESSION AND PLAN:  1) COPD; The current medical regimen is effective;  continue present plan and medications. Refilled symbicort today.  She has had prevnar 13 but she declines pneumovax today. I will have her think about and will ask her again next year.  2) Elevated bp w/out dx of HTN: emphasized importance of home monitoring again today. Instructions: Check bp out of medical office at least 3 times in the next 1 month. Call if avg of the 3 is >140 on top or >90 on bottom or if any one reading is >160 on top or >100 on bottom.  3) Hx of lung ca, in remission; next routine oncology f/u set for 01/2015.  4) Borderline LDL elevation; plan on repeat FLP next f/u in 6 mo.  An After Visit Summary was printed and given to the patient.   FOLLOW UP: No Follow-up on file.

## 2014-08-12 NOTE — Patient Instructions (Signed)
Check bp out of medical office at least 3 times in the next 1 month. Call if avg of the 3 is >140 on top or >90 on bottom or if any one reading is >160 on top or >100 on bottom.

## 2014-08-12 NOTE — Progress Notes (Signed)
Pre visit review using our clinic review tool, if applicable. No additional management support is needed unless otherwise documented below in the visit note. 

## 2014-10-28 DIAGNOSIS — H2513 Age-related nuclear cataract, bilateral: Secondary | ICD-10-CM | POA: Diagnosis not present

## 2015-01-19 ENCOUNTER — Ambulatory Visit (HOSPITAL_COMMUNITY)
Admission: RE | Admit: 2015-01-19 | Discharge: 2015-01-19 | Disposition: A | Discharge status: Home / Self Care | Payer: Medicare Other | Source: Ambulatory Visit | Attending: Internal Medicine | Admitting: Internal Medicine

## 2015-01-19 ENCOUNTER — Encounter (HOSPITAL_COMMUNITY): Payer: Self-pay

## 2015-01-19 ENCOUNTER — Other Ambulatory Visit (HOSPITAL_BASED_OUTPATIENT_CLINIC_OR_DEPARTMENT_OTHER): Payer: Medicare Other

## 2015-01-19 DIAGNOSIS — C3490 Malignant neoplasm of unspecified part of unspecified bronchus or lung: Secondary | ICD-10-CM

## 2015-01-19 DIAGNOSIS — Z85118 Personal history of other malignant neoplasm of bronchus and lung: Secondary | ICD-10-CM | POA: Diagnosis present

## 2015-01-19 DIAGNOSIS — Z87891 Personal history of nicotine dependence: Secondary | ICD-10-CM | POA: Diagnosis not present

## 2015-01-19 DIAGNOSIS — J449 Chronic obstructive pulmonary disease, unspecified: Secondary | ICD-10-CM | POA: Insufficient documentation

## 2015-01-19 DIAGNOSIS — R918 Other nonspecific abnormal finding of lung field: Secondary | ICD-10-CM | POA: Diagnosis not present

## 2015-01-19 DIAGNOSIS — Z923 Personal history of irradiation: Secondary | ICD-10-CM | POA: Insufficient documentation

## 2015-01-19 DIAGNOSIS — Z9221 Personal history of antineoplastic chemotherapy: Secondary | ICD-10-CM | POA: Diagnosis not present

## 2015-01-19 LAB — COMPREHENSIVE METABOLIC PANEL (CC13)
ALT: 16 U/L (ref 0–55)
ANION GAP: 7 meq/L (ref 3–11)
AST: 21 U/L (ref 5–34)
Albumin: 3.9 g/dL (ref 3.5–5.0)
Alkaline Phosphatase: 70 U/L (ref 40–150)
BILIRUBIN TOTAL: 0.54 mg/dL (ref 0.20–1.20)
BUN: 13 mg/dL (ref 7.0–26.0)
CALCIUM: 9.2 mg/dL (ref 8.4–10.4)
CO2: 27 mEq/L (ref 22–29)
CREATININE: 0.9 mg/dL (ref 0.6–1.1)
Chloride: 104 mEq/L (ref 98–109)
EGFR: 63 mL/min/{1.73_m2} — ABNORMAL LOW (ref >90)
Glucose: 100 mg/dl (ref 70–140)
Potassium: 4.4 mEq/L (ref 3.5–5.1)
Sodium: 138 mEq/L (ref 136–145)
TOTAL PROTEIN: 6.5 g/dL (ref 6.4–8.3)

## 2015-01-19 LAB — CBC WITH DIFFERENTIAL/PLATELET
BASO%: 0.6 % (ref 0.0–2.0)
Basophils Absolute: 0 10*3/uL (ref 0.0–0.1)
EOS ABS: 0.2 10*3/uL (ref 0.0–0.5)
EOS%: 6.1 % (ref 0.0–7.0)
HEMATOCRIT: 43.4 % (ref 34.8–46.6)
HGB: 14.8 g/dL (ref 11.6–15.9)
LYMPH#: 0.6 10*3/uL — AB (ref 0.9–3.3)
LYMPH%: 18.2 % (ref 14.0–49.7)
MCH: 32.5 pg (ref 25.1–34.0)
MCHC: 34.1 g/dL (ref 31.5–36.0)
MCV: 95.4 fL (ref 79.5–101.0)
MONO#: 0.4 10*3/uL (ref 0.1–0.9)
MONO%: 11.6 % (ref 0.0–14.0)
NEUT%: 63.5 % (ref 38.4–76.8)
NEUTROS ABS: 2.2 10*3/uL (ref 1.5–6.5)
PLATELETS: 164 10*3/uL (ref 145–400)
RBC: 4.55 10*6/uL (ref 3.70–5.45)
RDW: 14 % (ref 11.2–14.5)
WBC: 3.5 10*3/uL — ABNORMAL LOW (ref 3.9–10.3)

## 2015-01-26 ENCOUNTER — Ambulatory Visit (HOSPITAL_BASED_OUTPATIENT_CLINIC_OR_DEPARTMENT_OTHER): Payer: Medicare Other | Admitting: Internal Medicine

## 2015-01-26 ENCOUNTER — Encounter: Payer: Self-pay | Admitting: Internal Medicine

## 2015-01-26 ENCOUNTER — Telehealth: Payer: Self-pay | Admitting: Internal Medicine

## 2015-01-26 VITALS — BP 149/74 | HR 80 | Temp 98.3°F | Resp 18 | Ht 66.25 in | Wt 127.7 lb

## 2015-01-26 DIAGNOSIS — Z85118 Personal history of other malignant neoplasm of bronchus and lung: Secondary | ICD-10-CM | POA: Diagnosis not present

## 2015-01-26 DIAGNOSIS — C3411 Malignant neoplasm of upper lobe, right bronchus or lung: Secondary | ICD-10-CM

## 2015-01-26 NOTE — Telephone Encounter (Signed)
Gave and printed appts ched and avs for pt for DEC  °

## 2015-01-26 NOTE — Progress Notes (Signed)
[facility] [phone]   [phone]  OFFICE PROGRESS NOTE  Tammi Sou, MD [address] [place] [address]  PRINCIPAL DIAGNOSIS: Stage IIIA (TX, N2, MX) non-small cell lung cancer, squamous cell carcinoma diagnosed in October 2008.   PRIOR THERAPY:  1. Status post concurrent chemoradiation with weekly carboplatin and paclitaxel. Last dose was given April 05, 2007. 2. Status post 3 cycles of consolidation chemotherapy with docetaxel, last dose was given July 15, 2007.  CURRENT THERAPY: Observation.  INTERVAL HISTORY: LATICA HOHMANN 69 y.o. female returns to the clinic today for routine annual followup visit accompanied by her sister. The patient has been on observation for more than 8 years. She is feeling fine today with no specific complaints. She denied having any significant chest pain, shortness of breath, cough or hemoptysis. She denied having any weight loss or night sweats. She has no nausea or vomiting, no fever or chills. The patient had repeat CT scan of the chest performed recently and she is here for evaluation and discussion of her scan results.  MEDICAL HISTORY: Past Medical History  Diagnosis Date  . COPD (chronic obstructive pulmonary disease)   . Personal history of lung cancer     2008    ALLERGIES:  has No Known Allergies.  MEDICATIONS:  Current Outpatient Prescriptions  Medication Sig Dispense Refill  . albuterol (ACCUNEB) 1.25 MG/3ML nebulizer solution Take 3 mLs (1.25 mg total) by nebulization every 6 (six) hours as needed. 75 mL 6  . budesonide-formoterol (SYMBICORT) 160-4.5 MCG/ACT inhaler Inhale 2 puffs into the lungs 2 (two) times daily. 3 Inhaler 3   No current facility-administered medications for this visit.    SURGICAL HISTORY:  Past Surgical History  Procedure Laterality Date  . Mediastinoscopy  2008    for bx of lung mass (Dr. Arlyce Dice)  . Tubal ligation      REVIEW OF SYSTEMS:   A comprehensive review of systems was negative.   PHYSICAL EXAMINATION: General appearance: alert, cooperative and no distress Head: Normocephalic, without obvious abnormality, atraumatic Neck: no adenopathy Lymph nodes: Cervical, supraclavicular, and axillary nodes normal. Resp: clear to auscultation bilaterally Cardio: regular rate and rhythm, S1, S2 normal, no murmur, click, rub or gallop GI: soft, non-tender; bowel sounds normal; no masses,  no organomegaly Extremities: extremities normal, atraumatic, no cyanosis or edema  ECOG PERFORMANCE STATUS: 0 - Asymptomatic  Blood pressure 149/74, pulse 80, temperature 98.3 F (36.8 C), temperature source Oral, resp. rate 18, height 5' 6.25" (1.683 m), weight 127 lb 11.2 oz (57.924 kg), SpO2 99 %.  LABORATORY DATA: Lab Results  Component Value Date   WBC 3.5* 01/19/2015   HGB 14.8 01/19/2015   HCT 43.4 01/19/2015   MCV 95.4 01/19/2015   PLT 164 01/19/2015      Chemistry      Component Value Date/Time   NA 138 01/19/2015 0947   NA 140 07/21/2011 0920   NA 139 07/21/2010 1207   K 4.4 01/19/2015 0947   K 3.7 07/21/2011 0920   K 4.3 07/21/2010 1207   CL 105 01/22/2012 0830   CL 98 07/21/2011 0920   CL 104 07/21/2010 1207   CO2 27 01/19/2015 0947   CO2 29 07/21/2011 0920   CO2 24 07/21/2010 1207   BUN 13.0 01/19/2015 0947   BUN 15 07/21/2011 0920   BUN 17 07/21/2010 1207   CREATININE 0.9 01/19/2015 0947   CREATININE 1.0 07/21/2011 0920  CREATININE 1.00 07/21/2010 1207      Component Value Date/Time   CALCIUM 9.2 01/19/2015 0947   CALCIUM 9.1 07/21/2011 0920   CALCIUM 9.2 07/21/2010 1207   ALKPHOS 70 01/19/2015 0947   ALKPHOS 81 07/21/2011 0920   ALKPHOS 84 07/21/2010 1207   AST 21 01/19/2015 0947   AST 24 07/21/2011 0920   AST 19 07/21/2010 1207   ALT 16 01/19/2015 0947   ALT 21 07/21/2011 0920   ALT 12 07/21/2010 1207   BILITOT 0.54 01/19/2015 0947   BILITOT 0.50 07/21/2011 0920   BILITOT 0.5 07/21/2010 1207         RADIOGRAPHIC STUDIES: Ct Chest Wo Contrast  01/19/2015   CLINICAL DATA:  Radiation therapy and chemotherapy completed in 2009 for lung cancer. Staging. COPD.  EXAM: CT CHEST WITHOUT CONTRAST  TECHNIQUE: Multidetector CT imaging of the chest was performed following the standard protocol without IV contrast.  COMPARISON:  01/20/2014  FINDINGS: Mediastinum/Nodes: No supraclavicular adenopathy. Aortic and branch vessel atherosclerosis. No mediastinal or definite hilar adenopathy, given limitations of unenhanced CT.  Lungs/Pleura: No pleural fluid. Right middle lobe airway compression is similar. Airways other ways patent. Moderate centrilobular emphysema.  Right apical bronchiectasis and consolidation. cavitation on the prior exam has resolved.  A separate area of volume loss and bronchiectasis in the right middle lobe is not significantly changed.  3 mm right lower lobe pulmonary nodule on image 21 is unchanged. Clear left lung.  Upper abdomen: Multiple relatively well-circumscribed tiny low-density liver lesions, similar over prior exams. Normal imaged portions of the spleen, stomach, pancreas, adrenal glands, kidneys.  Musculoskeletal: Heterogeneity involving the sternum is unchanged and may be due to radiation induced necrosis. No osseous destruction at the right lung apex. There is heterogeneity which is unchanged within. Right posterior third and fourth ribs, likely also radiation induced.  A mild superior endplate compression deformity at T11 is unchanged.  IMPRESSION: 1. 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 The central cavitation on the prior exam is no longer apparent. Although this could be due to differences aeration or obstruction of an area of cylindrical bronchiectasis, locally recurrent disease could have a similar appearance. Depending on clinical concern, this could be re-evaluated with PET or CT followup at 3 months. No well-defined mass  is seen in this area. 2. Otherwise, similar appearance of right-sided treatment changes. 3. Similar multiple low-density hepatic lesions, likely cysts or bile duct hamartomas. 4.  Atherosclerosis, including within the coronary arteries.   Electronically Signed   By: Abigail Miyamoto M.D.   On: 01/19/2015 12:26   ASSESSMENT AND PLAN: This is a very pleasant 69 years old white female with history of stage IIIa non-small cell lung cancer status post concurrent chemoradiation followed by consolidation chemotherapy and has been observation since March of 2009 with no evidence for disease recurrence.  Her recent CT scan of the chest showed no evidence for disease progression except for increased consolidation of the right apical lesion. I discussed the scan results with the patient and her sister. I recommended for her to continue on observation with repeat CT scan of the chest without contrast in 3 months based on the radiology recommendation. The patient was advised to call immediately if she has any concerning symptoms in the interval.  The patient voices understanding of current disease status and treatment options and is in agreement with the current care plan.  All questions were answered. The patient knows to call the  clinic with any problems, questions or concerns. We can certainly see the patient much sooner if necessary.  Disclaimer: This note was dictated with voice recognition software. Similar sounding words can inadvertently be transcribed and may be missed upon review.

## 2015-02-12 ENCOUNTER — Encounter: Payer: Self-pay | Admitting: Family Medicine

## 2015-02-12 ENCOUNTER — Ambulatory Visit (INDEPENDENT_AMBULATORY_CARE_PROVIDER_SITE_OTHER): Payer: Medicare Other | Admitting: Family Medicine

## 2015-02-12 VITALS — BP 139/79 | HR 68 | Temp 97.7°F | Resp 16 | Ht 66.25 in | Wt 127.0 lb

## 2015-02-12 DIAGNOSIS — Z85118 Personal history of other malignant neoplasm of bronchus and lung: Secondary | ICD-10-CM | POA: Diagnosis not present

## 2015-02-12 DIAGNOSIS — J438 Other emphysema: Secondary | ICD-10-CM | POA: Diagnosis not present

## 2015-02-12 DIAGNOSIS — Z23 Encounter for immunization: Secondary | ICD-10-CM | POA: Diagnosis not present

## 2015-02-12 MED ORDER — BUDESONIDE-FORMOTEROL FUMARATE 160-4.5 MCG/ACT IN AERO: 2.0000 | INHALATION_SPRAY | Freq: Two times a day (BID) | RESPIRATORY_TRACT | Status: DC

## 2015-02-12 NOTE — Progress Notes (Signed)
Pre visit review using our clinic review tool, if applicable. No additional management support is needed unless otherwise documented below in the visit note. 

## 2015-02-12 NOTE — Progress Notes (Signed)
OFFICE VISIT  02/12/2015   CC:  Chief Complaint  Patient presents with  . Follow-up    Pt is not fasting.      HPI:    Patient is a 69 y.o. Caucasian female who presents for 6 mo f/u COPD.  Most recent oncology f/u for hx of lung ca was 01/26/15: CT showed no evidence for disease progression except for increased consolidation of right apical lesion--recommended f/u CT chest w/out contrast in 3 mo.  Feeling well, not requiring any rescue albuterol.   Appetite is good, energy level good.  No fevers/night sweats.  No wt loss.    Past Medical History  Diagnosis Date  . COPD (chronic obstructive pulmonary disease)   . Personal history of lung cancer     2008; upper lobe R lung (Dr. Earlie Server)    Past Surgical History  Procedure Laterality Date  . Mediastinoscopy  2008    for bx of lung mass (Dr. Arlyce Dice)  . Tubal ligation      Outpatient Prescriptions Prior to Visit  Medication Sig Dispense Refill  . albuterol (ACCUNEB) 1.25 MG/3ML nebulizer solution Take 3 mLs (1.25 mg total) by nebulization every 6 (six) hours as needed. 75 mL 6  . budesonide-formoterol (SYMBICORT) 160-4.5 MCG/ACT inhaler Inhale 2 puffs into the lungs 2 (two) times daily. 3 Inhaler 3   No facility-administered medications prior to visit.    No Known Allergies  ROS As per HPI  PE: Blood pressure 139/79, pulse 68, temperature 97.7 F (36.5 C), temperature source Oral, resp. rate 16, height 5' 6.25" (1.683 m), weight 127 lb (57.607 kg), SpO2 98 %. AQT:MAUQ: no injection, icteris, swelling, or exudate.  EOMI, PERRLA. Mouth: lips without lesion/swelling.  Oral mucosa pink and moist. Oropharynx without erythema, exudate, or swelling.  Neck: supple/nontender.  No LAD, mass, or TM.  Carotid pulses 2+ bilaterally, without bruits. CV: RRR, no m/r/g.   LUNGS: CTA bilat, nonlabored resps, good aeration in all lung fields. EXT: no clubbing, cyanosis, or edema.    LABS:  none  IMPRESSION AND PLAN:  1)  COPD: The current medical regimen is effective;  continue present plan and medications. High dose flu vaccine given today.  2) hx of lung ca: recent surveillance chest CT with mildly abnl finding--likely not cancer but needs f/u noncontrast CT in 3 mo as per radiologist and oncologist.  An After Visit Summary was printed and given to the patient.  FOLLOW UP: Return in about 6 months (around Mar 16, 202017) for AWV.

## 2015-03-31 ENCOUNTER — Encounter: Payer: Self-pay | Admitting: Family Medicine

## 2015-03-31 ENCOUNTER — Ambulatory Visit (INDEPENDENT_AMBULATORY_CARE_PROVIDER_SITE_OTHER): Payer: Medicare Other | Admitting: Family Medicine

## 2015-03-31 VITALS — BP 131/76 | HR 89 | Temp 98.3°F | Resp 16 | Ht 66.25 in | Wt 126.0 lb

## 2015-03-31 DIAGNOSIS — J069 Acute upper respiratory infection, unspecified: Secondary | ICD-10-CM

## 2015-03-31 DIAGNOSIS — J441 Chronic obstructive pulmonary disease with (acute) exacerbation: Secondary | ICD-10-CM

## 2015-03-31 MED ORDER — PREDNISONE 20 MG PO TABS: ORAL_TABLET | ORAL | Status: DC

## 2015-03-31 MED ORDER — AZITHROMYCIN 250 MG PO TABS: ORAL_TABLET | ORAL | Status: DC

## 2015-03-31 MED ORDER — IPRATROPIUM BROMIDE 0.03 % NA SOLN: 2.0000 | Freq: Two times a day (BID) | NASAL | Status: DC

## 2015-03-31 NOTE — Progress Notes (Signed)
Pre visit review using our clinic review tool, if applicable. No additional management support is needed unless otherwise documented below in the visit note. 

## 2015-03-31 NOTE — Progress Notes (Signed)
OFFICE VISIT  03/31/2015   CC:  Chief Complaint  Patient presents with  . Cough    x 2 weeks   HPI:    Patient is a 69 y.o. Caucasian female who presents for cough x 2 wks, productive sometimes, other times not. Thick gray mucous w/out blood.  Feels SOB, has rattling and wheezing.  No fever. Lots of nasal mucous, PND.  No pain in face or upper teeth.  No ST.   No n/v/d. Has been using albut nebs regularly last 4d. She has been compliant with bid symbicort.  Past Medical History  Diagnosis Date  . COPD (chronic obstructive pulmonary disease) ([place])   . Personal history of lung cancer     2008; upper lobe R lung (Dr. Earlie Server)    Past Surgical History  Procedure Laterality Date  . Mediastinoscopy  2008    for bx of lung mass (Dr. Arlyce Dice)  . Tubal ligation      Outpatient Prescriptions Prior to Visit  Medication Sig Dispense Refill  . albuterol (ACCUNEB) 1.25 MG/3ML nebulizer solution Take 3 mLs (1.25 mg total) by nebulization every 6 (six) hours as needed. 75 mL 6  . budesonide-formoterol (SYMBICORT) 160-4.5 MCG/ACT inhaler Inhale 2 puffs into the lungs 2 (two) times daily. 3 Inhaler 3   No facility-administered medications prior to visit.    No Known Allergies  ROS As per HPI  PE: Blood pressure 131/76, pulse 89, temperature 98.3 F (36.8 C), temperature source Oral, resp. rate 16, height 5' 6.25" (1.683 m), weight 126 lb (57.153 kg), SpO2 99 %. VS: noted--normal. Gen: alert, NAD, NONTOXIC APPEARING. HEENT: eyes without injection, drainage, or swelling.  Ears: EACs clear, TMs with normal light reflex and landmarks.  Nose: Clear rhinorrhea, with some dried, crusty exudate adherent to mildly injected mucosa.  No purulent d/c.  No paranasal sinus TTP.  No facial swelling.  Throat and mouth without focal lesion.  No pharyngial swelling, erythema, or exudate.   Neck: supple, no LAD.   LUNGS: CTA bilat, nonlabored resps.  Mildly prolonged exp phase.  Good aeration.  CV:  RRR, no m/r/g. EXT: no c/c/e SKIN: no rash  LABS:  none  IMPRESSION AND PLAN:  1) Viral URI with acute exacerbation of COPD. Atrovent nasal spray. Prednisone '40mg'$  qd x 5d, then '20mg'$  qd x 5d. Z-pack. Continue albut neb treatments q6h prn.  An After Visit Summary was printed and given to the patient.  FOLLOW UP: Return if symptoms worsen or fail to improve.

## 2015-04-05 ENCOUNTER — Ambulatory Visit: Payer: Medicare Other | Admitting: Family Medicine

## 2015-04-27 ENCOUNTER — Encounter (HOSPITAL_COMMUNITY): Payer: Self-pay

## 2015-04-27 ENCOUNTER — Other Ambulatory Visit: Payer: Self-pay | Admitting: Medical Oncology

## 2015-04-27 ENCOUNTER — Ambulatory Visit (HOSPITAL_COMMUNITY)
Admission: RE | Admit: 2015-04-27 | Discharge: 2015-04-27 | Disposition: A | Discharge status: Home / Self Care | Payer: Medicare Other | Source: Ambulatory Visit | Attending: Internal Medicine | Admitting: Internal Medicine

## 2015-04-27 ENCOUNTER — Other Ambulatory Visit (HOSPITAL_BASED_OUTPATIENT_CLINIC_OR_DEPARTMENT_OTHER): Payer: Medicare Other

## 2015-04-27 DIAGNOSIS — C3411 Malignant neoplasm of upper lobe, right bronchus or lung: Secondary | ICD-10-CM

## 2015-04-27 DIAGNOSIS — I2510 Atherosclerotic heart disease of native coronary artery without angina pectoris: Secondary | ICD-10-CM | POA: Diagnosis not present

## 2015-04-27 DIAGNOSIS — Z9889 Other specified postprocedural states: Secondary | ICD-10-CM | POA: Diagnosis not present

## 2015-04-27 LAB — CBC WITH DIFFERENTIAL/PLATELET
BASO%: 0.3 % (ref 0.0–2.0)
Basophils Absolute: 0 10*3/uL (ref 0.0–0.1)
EOS%: 7.5 % — AB (ref 0.0–7.0)
Eosinophils Absolute: 0.2 10*3/uL (ref 0.0–0.5)
HCT: 45.1 % (ref 34.8–46.6)
HEMOGLOBIN: 15.2 g/dL (ref 11.6–15.9)
LYMPH%: 23.1 % (ref 14.0–49.7)
MCH: 31.7 pg (ref 25.1–34.0)
MCHC: 33.7 g/dL (ref 31.5–36.0)
MCV: 94 fL (ref 79.5–101.0)
MONO#: 0.3 10*3/uL (ref 0.1–0.9)
MONO%: 10.4 % (ref 0.0–14.0)
NEUT%: 58.7 % (ref 38.4–76.8)
NEUTROS ABS: 1.8 10*3/uL (ref 1.5–6.5)
Platelets: 150 10*3/uL (ref 145–400)
RBC: 4.8 10*6/uL (ref 3.70–5.45)
RDW: 13.7 % (ref 11.2–14.5)
WBC: 3.1 10*3/uL — AB (ref 3.9–10.3)
lymph#: 0.7 10*3/uL — ABNORMAL LOW (ref 0.9–3.3)

## 2015-04-27 LAB — COMPREHENSIVE METABOLIC PANEL
ALBUMIN: 3.9 g/dL (ref 3.5–5.0)
ALT: 20 U/L (ref 0–55)
ANION GAP: 8 meq/L (ref 3–11)
AST: 25 U/L (ref 5–34)
Alkaline Phosphatase: 84 U/L (ref 40–150)
BUN: 14.9 mg/dL (ref 7.0–26.0)
CHLORIDE: 103 meq/L (ref 98–109)
CO2: 25 mEq/L (ref 22–29)
CREATININE: 1 mg/dL (ref 0.6–1.1)
Calcium: 9.6 mg/dL (ref 8.4–10.4)
EGFR: 55 mL/min/{1.73_m2} — ABNORMAL LOW (ref >90)
Glucose: 97 mg/dl (ref 70–140)
POTASSIUM: 4.3 meq/L (ref 3.5–5.1)
SODIUM: 137 meq/L (ref 136–145)
TOTAL PROTEIN: 6.9 g/dL (ref 6.4–8.3)
Total Bilirubin: 0.69 mg/dL (ref 0.20–1.20)

## 2015-04-27 MED ORDER — IOHEXOL 300 MG/ML  SOLN
75.0000 mL | Freq: Once | INTRAMUSCULAR | Status: AC | PRN
  Administered 2015-04-27: 75 mL via INTRAVENOUS

## 2015-05-04 ENCOUNTER — Encounter: Payer: Self-pay | Admitting: Internal Medicine

## 2015-05-04 ENCOUNTER — Ambulatory Visit (HOSPITAL_BASED_OUTPATIENT_CLINIC_OR_DEPARTMENT_OTHER): Payer: Medicare Other | Admitting: Internal Medicine

## 2015-05-04 ENCOUNTER — Telehealth: Payer: Self-pay | Admitting: Internal Medicine

## 2015-05-04 VITALS — BP 137/77 | HR 77 | Resp 17 | Ht 66.25 in | Wt 129.8 lb

## 2015-05-04 DIAGNOSIS — C3411 Malignant neoplasm of upper lobe, right bronchus or lung: Secondary | ICD-10-CM

## 2015-05-04 DIAGNOSIS — Z85118 Personal history of other malignant neoplasm of bronchus and lung: Secondary | ICD-10-CM

## 2015-05-04 NOTE — Telephone Encounter (Signed)
Scheduled appt with patient while she was here in person at [facility] office.       [facility].

## 2015-05-04 NOTE — Progress Notes (Signed)
[facility] [phone]   Fax:([phone]) [phone]  OFFICE PROGRESS NOTE  Tammi Sou, MD [address] [place] [address]  PRINCIPAL DIAGNOSIS: Stage IIIA (TX, N2, MX) non-small cell lung cancer, squamous cell carcinoma diagnosed in October 2008.   PRIOR THERAPY:  1. Status post concurrent chemoradiation with weekly carboplatin and paclitaxel. Last dose was given April 05, 2007. 2. Status post 3 cycles of consolidation chemotherapy with docetaxel, last dose was given July 15, 2007.  CURRENT THERAPY: Observation.  INTERVAL HISTORY: Leah Howell 69 y.o. female returns to the clinic today for routine annual followup visit accompanied by her sister and daughter. The patient has been on observation since March 2009.and has been doing fine with no specific complaints. She was found on previous CT scan of the chest to have right apical pleural-parenchymal opacity. Local disease recurrence was a concern. I will order CT scan of the chest for reevaluation of this lesion and the patient is here today for discussion of her scan results and recommendation regarding her condition. She denied having any significant chest pain, shortness of breath, cough or hemoptysis. She denied having any weight loss or night sweats. She has no nausea or vomiting, no fever or chills.   MEDICAL HISTORY: Past Medical History  Diagnosis Date  . COPD (chronic obstructive pulmonary disease) ([facility])   . Personal history of lung cancer     2008; upper lobe R lung (Dr. Earlie Server)    ALLERGIES:  has No Known Allergies.  MEDICATIONS:  Current Outpatient Prescriptions  Medication Sig Dispense Refill  . albuterol (ACCUNEB) 1.25 MG/3ML nebulizer solution Take 3 mLs (1.25 mg total) by nebulization every 6 (six) hours as needed. 75 mL 6  . azithromycin (ZITHROMAX) 250 MG tablet 2 tabs po qd x 1d, then 1 tab po qd x 4d 6 each 0  . budesonide-formoterol (SYMBICORT) 160-4.5 MCG/ACT  inhaler Inhale 2 puffs into the lungs 2 (two) times daily. 3 Inhaler 3  . ipratropium (ATROVENT) 0.03 % nasal spray Place 2 sprays into both nostrils every 12 (twelve) hours. 30 mL 3  . predniSONE (DELTASONE) 20 MG tablet 2 tabs po qd x 5d, then 1 tab po qd x 5d 15 tablet 0   No current facility-administered medications for this visit.    SURGICAL HISTORY:  Past Surgical History  Procedure Laterality Date  . Mediastinoscopy  2008    for bx of lung mass (Dr. Arlyce Dice)  . Tubal ligation      REVIEW OF SYSTEMS:  A comprehensive review of systems was negative.   PHYSICAL EXAMINATION: General appearance: alert, cooperative and no distress Head: Normocephalic, without obvious abnormality, atraumatic Neck: no adenopathy Lymph nodes: Cervical, supraclavicular, and axillary nodes normal. Resp: clear to auscultation bilaterally Cardio: regular rate and rhythm, S1, S2 normal, no murmur, click, rub or gallop GI: soft, non-tender; bowel sounds normal; no masses,  no organomegaly Extremities: extremities normal, atraumatic, no cyanosis or edema  ECOG PERFORMANCE STATUS: 0 - Asymptomatic  Blood pressure 137/77, pulse 77, resp. rate 17, height 5' 6.25" (1.683 m), weight 129 lb 12.8 oz (58.877 kg), SpO2 100 %.  LABORATORY DATA: Lab Results  Component Value Date   WBC 3.1* 04/27/2015   HGB 15.2 04/27/2015   HCT 45.1 04/27/2015   MCV 94.0 04/27/2015   PLT 150 04/27/2015      Chemistry      Component Value Date/Time   NA 137 04/27/2015 1012   NA  140 07/21/2011 0920   NA 139 07/21/2010 1207   K 4.3 04/27/2015 1012   K 3.7 07/21/2011 0920   K 4.3 07/21/2010 1207   CL 105 01/22/2012 0830   CL 98 07/21/2011 0920   CL 104 07/21/2010 1207   CO2 25 04/27/2015 1012   CO2 29 07/21/2011 0920   CO2 24 07/21/2010 1207   BUN 14.9 04/27/2015 1012   BUN 15 07/21/2011 0920   BUN 17 07/21/2010 1207   CREATININE 1.0 04/27/2015 1012   CREATININE 1.0 07/21/2011 0920   CREATININE 1.00 07/21/2010  1207      Component Value Date/Time   CALCIUM 9.6 04/27/2015 1012   CALCIUM 9.1 07/21/2011 0920   CALCIUM 9.2 07/21/2010 1207   ALKPHOS 84 04/27/2015 1012   ALKPHOS 81 07/21/2011 0920   ALKPHOS 84 07/21/2010 1207   AST 25 04/27/2015 1012   AST 24 07/21/2011 0920   AST 19 07/21/2010 1207   ALT 20 04/27/2015 1012   ALT 21 07/21/2011 0920   ALT 12 07/21/2010 1207   BILITOT 0.69 04/27/2015 1012   BILITOT 0.50 07/21/2011 0920   BILITOT 0.5 07/21/2010 1207       RADIOGRAPHIC STUDIES: Ct Chest W Contrast  04/27/2015  CLINICAL DATA:  69 year old female with history of lung cancer diagnosed in 2005 status post radiation therapy, completed in 2009, and chemotherapy, also completed in 2009. EXAM: CT CHEST WITH CONTRAST TECHNIQUE: Multidetector CT imaging of the chest was performed during intravenous contrast administration. CONTRAST:  61m OMNIPAQUE IOHEXOL 300 MG/ML  SOLN COMPARISON:  Chest CT 01/19/2015. FINDINGS: Mediastinum/Lymph Nodes: Heart size is normal. There is no significant pericardial fluid, thickening or pericardial calcification. There is atherosclerosis of the thoracic aorta, the great vessels of the mediastinum and the coronary arteries, including calcified atherosclerotic plaque in the left main and left anterior descending coronary arteries. No pathologically enlarged mediastinal or hilar lymph nodes. Esophagus is unremarkable in appearance. No axillary lymphadenopathy. Lungs/Pleura: Patchy atrial changes of radiation therapy are again noted in the right lung with extensive mass-like postradiation fibrosis in the apex of the right upper lobe. Today's post-contrast images demonstrate multiple vessels coursing throughout this area in the apex of the right hemithorax, without a definite mass. There are additional areas of chronic postradiation volume loss and scarring in the right upper and middle lobes as well. Compensatory hyperexpansion of the right lower lobe. No acute consolidative  airspace disease. No pleural effusions. No new suspicious appearing pulmonary nodules or masses. Mild linear scarring in the inferior segment of the lingula. Upper Abdomen: Innumerable low-attenuation lesions scattered throughout the visualized liver appear similar to numerous prior examinations, presumably small cysts and/or biliary hamartomas. Musculoskeletal/Soft Tissues: Mixed lucent and sclerotic slightly expansile appearance of the manubrium and upper sternum, very similar to prior examinations, presumably postradiation changes. There are no aggressive appearing lytic or blastic lesions noted in the visualized portions of the skeleton. IMPRESSION: 1. Chronic post treatment related changes of mass-like fibrosis again noted in the apex of the right hemithorax, with additional chronic post treatment related findings, as above. No evidence to suggest local recurrence of disease or metastatic disease on today's examination. 2. Atherosclerosis, including left main and left anterior descending coronary artery disease. Please note that although the presence of coronary artery calcium documents the presence of coronary artery disease, the severity of this disease and any potential stenosis cannot be assessed on this non-gated CT examination. Assessment for potential risk factor modification, dietary therapy or pharmacologic therapy may be  warranted, if clinically indicated. 3. Additional incidental findings, as above. Electronically Signed   By: Vinnie Langton M.D.   On: 04/27/2015 13:58   ASSESSMENT AND PLAN: This is a very pleasant 69 years old white female with history of stage IIIa non-small cell lung cancer status post concurrent chemoradiation followed by consolidation chemotherapy and has been observation since March of 2009 with no evidence for disease recurrence.  The recent CT scan of the chest showed chronic changes with no clear evidence for disease recurrence. I discussed the scan results with the  patient and her family. I recommended for her to continue on observation with repeat CT scan of the chest without contrast in October 2017. She will come back for follow-up visit at that time. The patient was advised to call immediately if she has any concerning symptoms in the interval. The patient voices understanding of current disease status and treatment options and is in agreement with the current care plan.  All questions were answered. The patient knows to call the clinic with any problems, questions or concerns. We can certainly see the patient much sooner if necessary.  Disclaimer: This note was dictated with voice recognition software. Similar sounding words can inadvertently be transcribed and may be missed upon review.

## 2015-08-12 ENCOUNTER — Encounter: Payer: Self-pay | Admitting: Family Medicine

## 2015-08-12 ENCOUNTER — Ambulatory Visit (INDEPENDENT_AMBULATORY_CARE_PROVIDER_SITE_OTHER): Payer: Medicare Other | Admitting: Family Medicine

## 2015-08-12 VITALS — BP 148/82 | HR 75 | Temp 97.4°F | Resp 16 | Ht 64.5 in | Wt 126.8 lb

## 2015-08-12 DIAGNOSIS — Z0000 Encounter for general adult medical examination without abnormal findings: Secondary | ICD-10-CM

## 2015-08-12 DIAGNOSIS — J438 Other emphysema: Secondary | ICD-10-CM

## 2015-08-12 MED ORDER — BUDESONIDE-FORMOTEROL FUMARATE 160-4.5 MCG/ACT IN AERO: 2.0000 | INHALATION_SPRAY | Freq: Two times a day (BID) | RESPIRATORY_TRACT | Status: DC

## 2015-08-12 NOTE — Progress Notes (Signed)
The patient is here for annual Medicare wellness examination and management of other chronic and acute problems. Other problems discussed today: none   AWV DATA The risk factors are reflected in the social history.  The roster of all physicians providing medical care to patient is listed in the Snapshot section of the chart.  Activities of daily living:  The patient is 100% independent in all ADLs: dressing, toileting, feeding as well as independent mobility.  Home safety : The patient has smoke detectors in the home. They wear seatbelts. No firearms at home ( firearms are present in the home, kept in a safe fashion). There is no violence in the home.   There is no risks for hepatitis, STDs or HIV. There is no history of blood transfusion. They have no travel history to infectious disease endemic areas of the world.  The patient has not seen their dentist in the last six month /c she has dentures. They have seen their eye doctor in the last year.  They deny any hearing difficulty and have not had audiologic testing in the last year.  They do not  have excessive sun exposure. Discussed the need for sun protection: hats, long sleeves and use of sunscreen if there is significant sun exposure.   Diet: the importance of a healthy diet is discussed. They do have a "fairly" healthy diet.  The patient does not have a regular exercise program.  She tries to stay active, esp with yard work.  The benefits of regular aerobic exercise were discussed.  Depression screen: there are no signs or vegative symptoms of depression- irritability, change in appetite, anhedonia, sadness/tearfullness.  Cognitive assessment: the patient manages all their financial and personal affairs and is actively engaged. They could relate day,date,year and events; recalled 3/3 objects at 3 minutes; performed clock-face test normally.  Reviewed advanced directives with pt today.  The following portions of the patient's history  were reviewed and updated as appropriate: allergies, current medications, past family history, past medical history,  past surgical history, past social history  and problem list.  Vision, hearing, body mass index were assessed and reviewed. Body mass index is 21.43 kg/(m^2).   During the course of the visit the patient was educated and counseled about appropriate screening and preventive services including :  Annual wellness visit --doing today. diabetes screening: done 04/2015 colorectal cancer screening: pt declines. recommended immunizations (influenza, pneumococcal, Hep B): she is UTD on flu and prevnar 13.  No record of pneumovax 23 but pt states she had this in the past and she declines another. Bone mass measurement: she declines the test b/c she states she would refuse any med rx'd if osteoporosis was detected. Counseling to prevent tobacco use--N/A.  She quit smoking over a decade ago.   Depression screening: today (neg). Glaucoma screening: gets via her eye MD Hepatitis C virus screening: declines HIV virus screening: declines Lung cancer screening: n/a Medical nutrition therapy--N/A Prostate cancer screening--N/A Screening mammography--Pt declines Screening pap tests, pelvic exam, and clinical breast exam: pt states she had this done w/in the last 2 yrs. Ultrasound screening for AAA: n/a  A written plan of action regarding the above screening and preventative services was given to the patient today.  An After Visit Summary was printed and given to the patient.  Signed:  Crissie Sickles, MD           02/14/2016

## 2015-08-12 NOTE — Progress Notes (Signed)
Pre visit review using our clinic review tool, if applicable. No additional management support is needed unless otherwise documented below in the visit note. 

## 2016-02-10 ENCOUNTER — Ambulatory Visit: Payer: Medicare Other | Admitting: Family Medicine

## 2016-02-16 ENCOUNTER — Other Ambulatory Visit (HOSPITAL_BASED_OUTPATIENT_CLINIC_OR_DEPARTMENT_OTHER): Payer: Medicare Other

## 2016-02-16 ENCOUNTER — Encounter: Payer: Self-pay | Admitting: Family Medicine

## 2016-02-16 ENCOUNTER — Ambulatory Visit (INDEPENDENT_AMBULATORY_CARE_PROVIDER_SITE_OTHER): Payer: Medicare Other | Admitting: Family Medicine

## 2016-02-16 VITALS — BP 113/71 | HR 81 | Temp 97.6°F | Resp 16 | Wt 120.4 lb

## 2016-02-16 DIAGNOSIS — J438 Other emphysema: Secondary | ICD-10-CM

## 2016-02-16 DIAGNOSIS — C3411 Malignant neoplasm of upper lobe, right bronchus or lung: Secondary | ICD-10-CM

## 2016-02-16 DIAGNOSIS — Z85118 Personal history of other malignant neoplasm of bronchus and lung: Secondary | ICD-10-CM

## 2016-02-16 DIAGNOSIS — J449 Chronic obstructive pulmonary disease, unspecified: Secondary | ICD-10-CM

## 2016-02-16 DIAGNOSIS — Z23 Encounter for immunization: Secondary | ICD-10-CM | POA: Diagnosis not present

## 2016-02-16 LAB — CBC WITH DIFFERENTIAL/PLATELET
BASO%: 0.7 % (ref 0.0–2.0)
BASOS ABS: 0 10*3/uL (ref 0.0–0.1)
EOS ABS: 0.2 10*3/uL (ref 0.0–0.5)
EOS%: 6 % (ref 0.0–7.0)
HCT: 44.6 % (ref 34.8–46.6)
HEMOGLOBIN: 15.3 g/dL (ref 11.6–15.9)
LYMPH%: 19.1 % (ref 14.0–49.7)
MCH: 33.8 pg (ref 25.1–34.0)
MCHC: 34.3 g/dL (ref 31.5–36.0)
MCV: 98.7 fL (ref 79.5–101.0)
MONO#: 0.4 10*3/uL (ref 0.1–0.9)
MONO%: 13.5 % (ref 0.0–14.0)
NEUT%: 60.7 % (ref 38.4–76.8)
NEUTROS ABS: 1.7 10*3/uL (ref 1.5–6.5)
PLATELETS: 130 10*3/uL — AB (ref 145–400)
RBC: 4.52 10*6/uL (ref 3.70–5.45)
RDW: 14 % (ref 11.2–14.5)
WBC: 2.8 10*3/uL — AB (ref 3.9–10.3)
lymph#: 0.5 10*3/uL — ABNORMAL LOW (ref 0.9–3.3)

## 2016-02-16 LAB — COMPREHENSIVE METABOLIC PANEL
ALBUMIN: 3.8 g/dL (ref 3.5–5.0)
ALK PHOS: 70 U/L (ref 40–150)
ALT: 30 U/L (ref 0–55)
ANION GAP: 10 meq/L (ref 3–11)
AST: 36 U/L — ABNORMAL HIGH (ref 5–34)
BILIRUBIN TOTAL: 0.64 mg/dL (ref 0.20–1.20)
BUN: 10 mg/dL (ref 7.0–26.0)
CO2: 24 mEq/L (ref 22–29)
Calcium: 9.5 mg/dL (ref 8.4–10.4)
Chloride: 105 mEq/L (ref 98–109)
Creatinine: 0.8 mg/dL (ref 0.6–1.1)
EGFR: 77 mL/min/{1.73_m2} — AB (ref >90)
Glucose: 101 mg/dl (ref 70–140)
Potassium: 4.4 mEq/L (ref 3.5–5.1)
Sodium: 140 mEq/L (ref 136–145)
TOTAL PROTEIN: 6.7 g/dL (ref 6.4–8.3)

## 2016-02-16 MED ORDER — BUDESONIDE-FORMOTEROL FUMARATE 160-4.5 MCG/ACT IN AERO: 2.0000 | INHALATION_SPRAY | Freq: Two times a day (BID) | RESPIRATORY_TRACT | 3 refills | Status: DC

## 2016-02-16 NOTE — Progress Notes (Signed)
Pre visit review using our clinic review tool, if applicable. No additional management support is needed unless otherwise documented below in the visit note. 

## 2016-02-16 NOTE — Progress Notes (Signed)
OFFICE VISIT  02/16/2016   CC:  Chief Complaint  Patient presents with  . Follow-up    COPD   HPI:    Patient is a 70 y.o. Caucasian female who presents for 6 mo f/u COPD, hx of lung ca--in remission. She has been feeling well, taking symbicort but only prn (2-3 times a week for SOB/wheeze).  Never uses albuterol. Has routine oncology surveillance f/u labs and CT scheduled.  Then sees Dr. Julien Nordmann the day after her CT.  ROS: no fevers, no cough, no hemoptysis.     Past Medical History:  Diagnosis Date  . COPD (chronic obstructive pulmonary disease) ([facility])   . Personal history of lung cancer    2008; upper lobe R lung (Dr. Earlie Server)    Past Surgical History:  Procedure Laterality Date  . MEDIASTINOSCOPY  2008   for bx of lung mass (Dr. Arlyce Dice)  . TUBAL LIGATION      Outpatient Medications Prior to Visit  Medication Sig Dispense Refill  . albuterol (ACCUNEB) 1.25 MG/3ML nebulizer solution Take 3 mLs (1.25 mg total) by nebulization every 6 (six) hours as needed. 75 mL 6  . ipratropium (ATROVENT) 0.03 % nasal spray Place 2 sprays into both nostrils every 12 (twelve) hours. 30 mL 3  . budesonide-formoterol (SYMBICORT) 160-4.5 MCG/ACT inhaler Inhale 2 puffs into the lungs 2 (two) times daily. 3 Inhaler 3   No facility-administered medications prior to visit.     No Known Allergies  ROS As per HPI  PE: Blood pressure 113/71, pulse 81, temperature 97.6 F (36.4 C), temperature source Oral, resp. rate 16, weight 120 lb 6.4 oz (54.6 kg), SpO2 97 %. Gen: Alert, well appearing.  Patient is oriented to person, place, time, and situation. CV: RRR, no m/r/g.   LUNGS: CTA bilat, nonlabored resps, good aeration in all lung fields. EXT: no clubbing, cyanosis, or edema.    LABS:  No results found for: TSH Lab Results  Component Value Date   WBC 2.8 (L) 02/16/2016   HGB 15.3 02/16/2016   HCT 44.6 02/16/2016   MCV 98.7 02/16/2016   PLT 130 (L) 02/16/2016   Lab Results   Component Value Date   CREATININE 0.8 02/16/2016   BUN 10.0 02/16/2016   NA 140 02/16/2016   K 4.4 02/16/2016   CL 105 01/22/2012   CO2 24 02/16/2016   Lab Results  Component Value Date   ALT 30 02/16/2016   AST 36 (H) 02/16/2016   ALKPHOS 70 02/16/2016   BILITOT 0.64 02/16/2016   Lab Results  Component Value Date   CHOL 258 (H) 02/05/2014   Lab Results  Component Value Date   HDL 81.90 02/05/2014   Lab Results  Component Value Date   LDLCALC 147 (H) 02/05/2014   Lab Results  Component Value Date   TRIG 148.0 02/05/2014   Lab Results  Component Value Date   CHOLHDL 3 02/05/2014    IMPRESSION AND PLAN:  1) COPD; using symbicort as her rescue inhaler.  I reminded her of the need to take this med 2 puffs bid on a scheduled basis, and use albuterol as rescue med.  She expressed understanding and said she would start taking it correctly.  Flu vaccine given today.  2) Hx of lung ca: in remission.  Labs today stable leukopenia and thrombocytopenia.  Has f/u CT chest scheduled and then oncology o/v to follow the CT.  An After Visit Summary was printed and given to the patient.  FOLLOW  UP: Return in about 6 months (around 08/16/2016) for routine chronic illness f/u.  Signed:  Crissie Sickles, MD           02/16/2016

## 2016-02-23 ENCOUNTER — Ambulatory Visit: Payer: Medicare Other | Admitting: Internal Medicine

## 2016-02-28 ENCOUNTER — Ambulatory Visit (HOSPITAL_COMMUNITY)
Admission: RE | Admit: 2016-02-28 | Discharge: 2016-02-28 | Disposition: A | Discharge status: Home / Self Care | Payer: Medicare Other | Source: Ambulatory Visit | Attending: Internal Medicine | Admitting: Internal Medicine

## 2016-02-28 ENCOUNTER — Encounter (HOSPITAL_COMMUNITY): Payer: Self-pay

## 2016-02-28 DIAGNOSIS — I700 Atherosclerosis of aorta: Secondary | ICD-10-CM | POA: Diagnosis not present

## 2016-02-28 DIAGNOSIS — I2510 Atherosclerotic heart disease of native coronary artery without angina pectoris: Secondary | ICD-10-CM | POA: Insufficient documentation

## 2016-02-28 DIAGNOSIS — C3411 Malignant neoplasm of upper lobe, right bronchus or lung: Secondary | ICD-10-CM | POA: Insufficient documentation

## 2016-02-28 DIAGNOSIS — C3490 Malignant neoplasm of unspecified part of unspecified bronchus or lung: Secondary | ICD-10-CM | POA: Diagnosis not present

## 2016-02-28 MED ORDER — IOPAMIDOL (ISOVUE-300) INJECTION 61%
75.0000 mL | Freq: Once | INTRAVENOUS | Status: AC | PRN
  Administered 2016-02-28: 75 mL via INTRAVENOUS

## 2016-02-29 ENCOUNTER — Telehealth: Payer: Self-pay | Admitting: Internal Medicine

## 2016-02-29 ENCOUNTER — Ambulatory Visit (HOSPITAL_BASED_OUTPATIENT_CLINIC_OR_DEPARTMENT_OTHER): Payer: Medicare Other | Admitting: Internal Medicine

## 2016-02-29 ENCOUNTER — Encounter: Payer: Self-pay | Admitting: Internal Medicine

## 2016-02-29 VITALS — BP 162/84 | HR 91 | Temp 97.6°F | Resp 16 | Ht 64.5 in | Wt 117.5 lb

## 2016-02-29 DIAGNOSIS — Z85118 Personal history of other malignant neoplasm of bronchus and lung: Secondary | ICD-10-CM

## 2016-02-29 DIAGNOSIS — J449 Chronic obstructive pulmonary disease, unspecified: Secondary | ICD-10-CM | POA: Diagnosis not present

## 2016-02-29 DIAGNOSIS — C3411 Malignant neoplasm of upper lobe, right bronchus or lung: Secondary | ICD-10-CM

## 2016-02-29 NOTE — Progress Notes (Signed)
[facility] [phone]) [phone]   Fax:(336) [phone]  OFFICE PROGRESS NOTE  Tammi Sou, MD [address] [place] [address]  PRINCIPAL DIAGNOSIS: Stage IIIA (TX, N2, MX) non-small cell lung cancer, squamous cell carcinoma diagnosed in October 2008.   PRIOR THERAPY:  1. Status post concurrent chemoradiation with weekly carboplatin and paclitaxel. Last dose was given April 05, 2007. 2. Status post 3 cycles of consolidation chemotherapy with docetaxel, last dose was given July 15, 2007.  CURRENT THERAPY: Observation.  INTERVAL HISTORY: Leah Howell 70 y.o. female returns to the clinic today for routine annual followup visit accompanied by her sister and daughter. The patient is feeling fine today with no specific complaints. She denied having any significant chest pain, shortness of breath, cough or hemoptysis. She denied having any weight loss or night sweats. She has no nausea or vomiting, no fever or chills. She had repeat CT scan of the chest performed recently and she is here for evaluation and discussion of her scan results.  MEDICAL HISTORY: Past Medical History:  Diagnosis Date  . COPD (chronic obstructive pulmonary disease) ([facility])   . Personal history of lung cancer    2008; upper lobe R lung (Dr. Earlie Server)    ALLERGIES:  has No Known Allergies.  MEDICATIONS:  Current Outpatient Prescriptions  Medication Sig Dispense Refill  . albuterol (ACCUNEB) 1.25 MG/3ML nebulizer solution Take 3 mLs (1.25 mg total) by nebulization every 6 (six) hours as needed. 75 mL 6  . budesonide-formoterol (SYMBICORT) 160-4.5 MCG/ACT inhaler Inhale 2 puffs into the lungs 2 (two) times daily. 3 Inhaler 3  . ipratropium (ATROVENT) 0.03 % nasal spray Place 2 sprays into both nostrils every 12 (twelve) hours. 30 mL 3   No current facility-administered medications for this visit.     SURGICAL HISTORY:  Past Surgical History:  Procedure Laterality Date   . MEDIASTINOSCOPY  2008   for bx of lung mass (Dr. Arlyce Dice)  . TUBAL LIGATION      REVIEW OF SYSTEMS:  A comprehensive review of systems was negative.   PHYSICAL EXAMINATION: General appearance: alert, cooperative and no distress Head: Normocephalic, without obvious abnormality, atraumatic Neck: no adenopathy Lymph nodes: Cervical, supraclavicular, and axillary nodes normal. Resp: clear to auscultation bilaterally Cardio: regular rate and rhythm, S1, S2 normal, no murmur, click, rub or gallop GI: soft, non-tender; bowel sounds normal; no masses,  no organomegaly Extremities: extremities normal, atraumatic, no cyanosis or edema  ECOG PERFORMANCE STATUS: 0 - Asymptomatic  Blood pressure (!) 162/84, pulse 91, temperature 97.6 F (36.4 C), temperature source Oral, resp. rate 16, height 5' 4.5" (1.638 m), weight 117 lb 8 oz (53.3 kg), SpO2 100 %.  LABORATORY DATA: Lab Results  Component Value Date   WBC 2.8 (L) 02/16/2016   HGB 15.3 02/16/2016   HCT 44.6 02/16/2016   MCV 98.7 02/16/2016   PLT 130 (L) 02/16/2016      Chemistry      Component Value Date/Time   NA 140 02/16/2016 0955   K 4.4 02/16/2016 0955   CL 105 01/22/2012 0830   CO2 24 02/16/2016 0955   BUN 10.0 02/16/2016 0955   CREATININE 0.8 02/16/2016 0955      Component Value Date/Time   CALCIUM 9.5 02/16/2016 0955   ALKPHOS 70 02/16/2016 0955   AST 36 (H) 02/16/2016 0955   ALT 30 02/16/2016 0955   BILITOT 0.64 02/16/2016 0955       RADIOGRAPHIC STUDIES:  Ct Chest W Contrast  Result Date: 02/28/2016 CLINICAL DATA:  Followup lung cancer EXAM: CT CHEST WITH CONTRAST TECHNIQUE: Multidetector CT imaging of the chest was performed during intravenous contrast administration. CONTRAST:  73m ISOVUE-300 IOPAMIDOL (ISOVUE-300) INJECTION 61% COMPARISON:  04/27/2015 FINDINGS: Cardiovascular: Heart size is normal. No pericardial effusion. Aortic atherosclerosis is identified calcification within the LAD coronary artery  identified. Mediastinum/Nodes: The trachea appears patent and is midline. Normal appearance of the esophagus. No mediastinal or hilar adenopathy. Lungs/Pleura: No pleural effusion. Changes of external beam radiation including chronic consolidation, fibrosis and architectural distortion is noted involving the apex of the right lung. Previously noted central area of cavitation has resolved. No suspicious pulmonary nodules or masses identified. Upper Abdomen: No acute abnormality.Numerous tiny cysts are identified throughout the liver compatible with falling cystic liver change. Musculoskeletal: Similar appearance of mixed lytic and sclerotic changes involving the manubrium and proximal sternum. There is degenerative disc disease identified within the mid thoracic spine. New compression deformity involving the T11 vertebra is new from previous exam. IMPRESSION: 1. Stable CT of the chest. No specific findings to suggest local recurrence of disease or metastatic disease. 2. Aortic atherosclerosis and LAD coronary artery calcification. Electronically Signed   By: TKerby MoorsM.D.   On: 02/28/2016 08:36   ASSESSMENT AND PLAN: This is a very pleasant 70years old white female with history of stage IIIa non-small cell lung cancer status post concurrent chemoradiation followed by consolidation chemotherapy and has been observation since March of 2009 with no evidence for disease recurrence.  The recent CT scan of the chest showed no evidence for disease recurrence. I discussed the scan results with the patient and her family. I recommended for the patient to continue on observation with routine follow-up visit by her primary care physician at this point. I would see her on as-needed basis The patient was advised to call immediately if she has any concerning symptoms in the interval. The patient voices understanding of current disease status and treatment options and is in agreement with the current care  plan.  All questions were answered. The patient knows to call the clinic with any problems, questions or concerns. We can certainly see the patient much sooner if necessary.  Disclaimer: This note was dictated with voice recognition software. Similar sounding words can inadvertently be transcribed and may be missed upon review.

## 2016-02-29 NOTE — Telephone Encounter (Signed)
No 10/17 los/orders/referrals

## 2016-03-01 ENCOUNTER — Encounter: Payer: Self-pay | Admitting: Family Medicine

## 2016-05-24 DIAGNOSIS — H2513 Age-related nuclear cataract, bilateral: Secondary | ICD-10-CM | POA: Diagnosis not present

## 2016-05-24 DIAGNOSIS — H524 Presbyopia: Secondary | ICD-10-CM | POA: Diagnosis not present

## 2016-05-24 DIAGNOSIS — H5213 Myopia, bilateral: Secondary | ICD-10-CM | POA: Diagnosis not present

## 2016-05-24 DIAGNOSIS — H52203 Unspecified astigmatism, bilateral: Secondary | ICD-10-CM | POA: Diagnosis not present

## 2016-06-02 ENCOUNTER — Encounter: Payer: Self-pay | Admitting: Family Medicine

## 2016-06-02 ENCOUNTER — Ambulatory Visit (INDEPENDENT_AMBULATORY_CARE_PROVIDER_SITE_OTHER): Payer: Medicare Other | Admitting: Family Medicine

## 2016-06-02 VITALS — BP 142/80 | HR 89 | Temp 98.3°F | Resp 16 | Ht 64.5 in | Wt 116.5 lb

## 2016-06-02 DIAGNOSIS — J441 Chronic obstructive pulmonary disease with (acute) exacerbation: Secondary | ICD-10-CM

## 2016-06-02 MED ORDER — PREDNISONE 20 MG PO TABS: ORAL_TABLET | ORAL | 0 refills | Status: DC

## 2016-06-02 MED ORDER — AZITHROMYCIN 250 MG PO TABS: ORAL_TABLET | ORAL | 0 refills | Status: DC

## 2016-06-02 NOTE — Progress Notes (Signed)
OFFICE VISIT  06/02/2016   CC:  Chief Complaint  Patient presents with  . Cough    x 2-3 weeks   HPI:    Patient is a 70 y.o. Caucasian female who presents for respiratory symptoms.   Onset 2-3 wks ago with coughing, low grade fever at first but nothing since.  +Wheezing, tight in chest, +DOE. Sx's not improving.  +Runny nose.  No ST.   Says albut neb doesn't help (says it never has.?)  No body aches.  No n/v/d.   Past Medical History:  Diagnosis Date  . COPD (chronic obstructive pulmonary disease) ([place])   . Personal history of lung cancer    2008; upper lobe R lung (Dr. Earlie Server).  Got chemo.  Has been on observation since 2009.  As of 02/2016 she has been "released" by oncology.     Past Surgical History:  Procedure Laterality Date  . MEDIASTINOSCOPY  2008   for bx of lung mass (Dr. Arlyce Dice)  . TUBAL LIGATION      Outpatient Medications Prior to Visit  Medication Sig Dispense Refill  . albuterol (ACCUNEB) 1.25 MG/3ML nebulizer solution Take 3 mLs (1.25 mg total) by nebulization every 6 (six) hours as needed. 75 mL 6  . budesonide-formoterol (SYMBICORT) 160-4.5 MCG/ACT inhaler Inhale 2 puffs into the lungs 2 (two) times daily. 3 Inhaler 3  . ipratropium (ATROVENT) 0.03 % nasal spray Place 2 sprays into both nostrils every 12 (twelve) hours. 30 mL 3   No facility-administered medications prior to visit.     No Known Allergies  ROS As per HPI  PE: Blood pressure (!) 142/80, pulse 89, temperature 98.3 F (36.8 C), temperature source Oral, resp. rate 16, height 5' 4.5" (1.638 m), weight 116 lb 8 oz (52.8 kg), SpO2 98 %. VS: noted--normal. Gen: alert, NAD, NONTOXIC APPEARING. HEENT: eyes without injection, drainage, or swelling.  Ears: EACs clear, TMs with normal light reflex and landmarks.  Nose: Clear rhinorrhea, with some dried, crusty exudate adherent to mildly injected mucosa.  No purulent d/c.  No paranasal sinus TTP.  No facial swelling.  Throat and mouth without  focal lesion.  No pharyngial swelling, erythema, or exudate.   Neck: supple, no LAD.   LUNGS: CTA bilat, nonlabored resps.  Mildly prolonged exp phase. CV: RRR, no m/r/g. EXT: no c/c/e SKIN: no rash    LABS:    Chemistry      Component Value Date/Time   NA 140 02/16/2016 0955   K 4.4 02/16/2016 0955   CL 105 01/22/2012 0830   CO2 24 02/16/2016 0955   BUN 10.0 02/16/2016 0955   CREATININE 0.8 02/16/2016 0955      Component Value Date/Time   CALCIUM 9.5 02/16/2016 0955   ALKPHOS 70 02/16/2016 0955   AST 36 (H) 02/16/2016 0955   ALT 30 02/16/2016 0955   BILITOT 0.64 02/16/2016 0955       IMPRESSION AND PLAN:  COPD exacerbation. Prednisone '40mg'$  qd x 5d, then '20mg'$  qd x 5d. Z-pack. Get otc generic robitussin DM OR Mucinex DM and use as directed on the packaging for cough and congestion. Use otc generic saline nasal spray 2-3 times per day to irrigate/moisturize your nasal passages.  An After Visit Summary was printed and given to the patient.  FOLLOW UP: Return if symptoms worsen or fail to improve.   Signed:  Crissie Sickles, MD           06/02/2016

## 2016-06-02 NOTE — Progress Notes (Signed)
Pre visit review using our clinic review tool, if applicable. No additional management support is needed unless otherwise documented below in the visit note. 

## 2016-06-19 ENCOUNTER — Telehealth: Payer: Self-pay | Admitting: Family Medicine

## 2016-06-19 NOTE — Telephone Encounter (Signed)
Pt states she feeling any better from taking the Zpac and asking if anything could be called in for her or would she need to be seen again, [facility] in [place].

## 2016-06-20 MED ORDER — PREDNISONE 20 MG PO TABS: ORAL_TABLET | ORAL | 0 refills | Status: DC

## 2016-06-20 MED ORDER — LEVOFLOXACIN 500 MG PO TABS: 500.0000 mg | ORAL_TABLET | Freq: Every day | ORAL | 0 refills | Status: DC

## 2016-06-20 NOTE — Telephone Encounter (Signed)
Pt calling back checking status on this, please advise.

## 2016-06-20 NOTE — Telephone Encounter (Signed)
OK. I eRx'd another round of prednisone plus sent in a new antibiotic (levaquin). Pt needs to be seen again in office if not feeling significant improvement in 5 days.

## 2016-06-20 NOTE — Telephone Encounter (Signed)
Pt advised and voiced understanding.   

## 2016-06-20 NOTE — Telephone Encounter (Signed)
Please advise. Thanks.  

## 2016-06-20 NOTE — Telephone Encounter (Signed)
Patient left VM on front desk asking for CB

## 2016-08-17 ENCOUNTER — Encounter: Payer: Self-pay | Admitting: Family Medicine

## 2016-08-17 ENCOUNTER — Ambulatory Visit (INDEPENDENT_AMBULATORY_CARE_PROVIDER_SITE_OTHER): Payer: Medicare Other | Admitting: Family Medicine

## 2016-08-17 VITALS — BP 156/78 | HR 76 | Temp 97.6°F | Resp 16 | Wt 115.0 lb

## 2016-08-17 DIAGNOSIS — D72818 Other decreased white blood cell count: Secondary | ICD-10-CM

## 2016-08-17 DIAGNOSIS — J438 Other emphysema: Secondary | ICD-10-CM

## 2016-08-17 DIAGNOSIS — R030 Elevated blood-pressure reading, without diagnosis of hypertension: Secondary | ICD-10-CM

## 2016-08-17 DIAGNOSIS — J069 Acute upper respiratory infection, unspecified: Secondary | ICD-10-CM | POA: Diagnosis not present

## 2016-08-17 DIAGNOSIS — E785 Hyperlipidemia, unspecified: Secondary | ICD-10-CM | POA: Diagnosis not present

## 2016-08-17 DIAGNOSIS — D696 Thrombocytopenia, unspecified: Secondary | ICD-10-CM

## 2016-08-17 DIAGNOSIS — B9789 Other viral agents as the cause of diseases classified elsewhere: Secondary | ICD-10-CM

## 2016-08-17 LAB — CBC WITH DIFFERENTIAL/PLATELET
BASOS PCT: 0.8 % (ref 0.0–3.0)
Basophils Absolute: 0 10*3/uL (ref 0.0–0.1)
EOS ABS: 0.2 10*3/uL (ref 0.0–0.7)
Eosinophils Relative: 4.4 % (ref 0.0–5.0)
HEMATOCRIT: 44 % (ref 36.0–46.0)
HEMOGLOBIN: 14.7 g/dL (ref 12.0–15.0)
LYMPHS PCT: 9.2 % — AB (ref 12.0–46.0)
Lymphs Abs: 0.4 10*3/uL — ABNORMAL LOW (ref 0.7–4.0)
MCHC: 33.4 g/dL (ref 30.0–36.0)
MCV: 97.9 fl (ref 78.0–100.0)
Monocytes Absolute: 0.3 10*3/uL (ref 0.1–1.0)
Monocytes Relative: 8.2 % (ref 3.0–12.0)
Neutro Abs: 3.2 10*3/uL (ref 1.4–7.7)
Neutrophils Relative %: 77.4 % — ABNORMAL HIGH (ref 43.0–77.0)
Platelets: 174 10*3/uL (ref 150.0–400.0)
RBC: 4.5 Mil/uL (ref 3.87–5.11)
RDW: 14.3 % (ref 11.5–15.5)
WBC: 4.1 10*3/uL (ref 4.0–10.5)

## 2016-08-17 MED ORDER — BUDESONIDE-FORMOTEROL FUMARATE 160-4.5 MCG/ACT IN AERO
2.0000 | INHALATION_SPRAY | Freq: Two times a day (BID) | RESPIRATORY_TRACT | 3 refills | Status: DC
Start: 2016-08-17 — End: 2017-02-15

## 2016-08-17 MED ORDER — IPRATROPIUM BROMIDE 0.03 % NA SOLN: 2.0000 | Freq: Two times a day (BID) | NASAL | 5 refills | Status: DC

## 2016-08-17 NOTE — Progress Notes (Signed)
Pre visit review using our clinic review tool, if applicable. No additional management support is needed unless otherwise documented below in the visit note. 

## 2016-08-17 NOTE — Progress Notes (Addendum)
OFFICE VISIT  08/17/2016   CC:  Chief Complaint  Patient presents with  . Follow-up    RCI & c/o sore throat x 2 days     HPI:    Patient is a 71 y.o. Caucasian female who presents for 6 mo f/u COPD.  Home bp:  A few bp checks after elevated bp here in the past have shown bp less than 130/80.  Recently has had a sore throat x 2d.  Still has cough from her copd exac from 06/02/16 but it is much improved. Nasal congestion/runny nose.  Subjective fever yesterday.  Ibup yest ant today. No body aches.  No n/v/d.  No sneezing.  She is not fasting today.  ROS: no nose bleeds, gross hematuria, or hematochezia/melena.  No recent SOB or wheezing.  No CP.  Past Medical History:  Diagnosis Date  . COPD (chronic obstructive pulmonary disease) ([place])   . Personal history of lung cancer    2008; upper lobe R lung (Dr. Earlie Server).  Got chemo.  Has been on observation since 2009.  As of 02/2016 she has been "released" by oncology.     Past Surgical History:  Procedure Laterality Date  . MEDIASTINOSCOPY  2008   for bx of lung mass (Dr. Arlyce Dice)  . TUBAL LIGATION      Outpatient Medications Prior to Visit  Medication Sig Dispense Refill  . albuterol (ACCUNEB) 1.25 MG/3ML nebulizer solution Take 3 mLs (1.25 mg total) by nebulization every 6 (six) hours as needed. 75 mL 6  . budesonide-formoterol (SYMBICORT) 160-4.5 MCG/ACT inhaler Inhale 2 puffs into the lungs 2 (two) times daily. 3 Inhaler 3  . ipratropium (ATROVENT) 0.03 % nasal spray Place 2 sprays into both nostrils every 12 (twelve) hours. 30 mL 3  . levofloxacin (LEVAQUIN) 500 MG tablet Take 1 tablet (500 mg total) by mouth daily. (Patient not taking: Reported on 08/17/2016) 10 tablet 0  . predniSONE (DELTASONE) 20 MG tablet 2 tabs po qd x 5d, then 1 tab po qd x 5d (Patient not taking: Reported on 08/17/2016) 15 tablet 0   No facility-administered medications prior to visit.     No Known Allergies  ROS As per HPI  PE: Blood pressure  (!) 156/78, pulse 76, temperature 97.6 F (36.4 C), temperature source Oral, resp. rate 16, weight 115 lb (52.2 kg), SpO2 100 %. VS: noted--normal. Gen: alert, NAD, NONTOXIC APPEARING. HEENT: eyes without injection, drainage, or swelling.  Ears: EACs clear, TMs with normal light reflex and landmarks.  Nose: Clear rhinorrhea, with some dried, crusty exudate adherent to mildly injected mucosa.  No purulent d/c.  No paranasal sinus TTP.  No facial swelling.  Throat and mouth without focal lesion.  No pharyngial swelling, erythema, or exudate.   Neck: supple, no LAD.   LUNGS: CTA bilat, nonlabored resps.   CV: RRR, no m/r/g. EXT: no c/c/e SKIN: no rash   LABS:   Lab Results  Component Value Date   WBC 2.8 (L) 02/16/2016   HGB 15.3 02/16/2016   HCT 44.6 02/16/2016   MCV 98.7 02/16/2016   PLT 130 (L) 02/16/2016   Lab Results  Component Value Date   CREATININE 0.8 02/16/2016   BUN 10.0 02/16/2016   NA 140 02/16/2016   K 4.4 02/16/2016   CL 105 01/22/2012   CO2 24 02/16/2016   Lab Results  Component Value Date   ALT 30 02/16/2016   AST 36 (H) 02/16/2016   ALKPHOS 70 02/16/2016   BILITOT 0.64  02/16/2016   Lab Results  Component Value Date   CHOL 258 (H) 02/05/2014   Lab Results  Component Value Date   HDL 81.90 02/05/2014   Lab Results  Component Value Date   LDLCALC 147 (H) 02/05/2014   Lab Results  Component Value Date   TRIG 148.0 02/05/2014   Lab Results  Component Value Date   CHOLHDL 3 02/05/2014    IMPRESSION AND PLAN:  1) COPD: stable. The current medical regimen is effective;  continue present plan and medications.  2) Viral URI; symptomatic care discussed.  3) Hx of borderline hyperlipidemia: not fasting today.  She'll come to next f/u visit fasting and we'll check FLP at that time.  4) Hx of leukopenia and thrombocytopenia on labs 02/2016. Recheck CBC today.   5) Elevated bp w/out dx of HTN: continue periodic home bp checks.  Call if  persistently > 140/90.  An After Visit Summary was printed and given to the patient.  FOLLOW UP: Return in about 6 months (around 02/16/2017) for routine chronic illness f/u (fasting).  Signed:  Crissie Sickles, MD           08/17/2016

## 2016-09-04 ENCOUNTER — Telehealth: Payer: Self-pay | Admitting: Family Medicine

## 2016-09-04 MED ORDER — ALBUTEROL SULFATE 1.25 MG/3ML IN NEBU: 1.0000 | INHALATION_SOLUTION | Freq: Four times a day (QID) | RESPIRATORY_TRACT | 6 refills | Status: DC | PRN

## 2016-09-04 NOTE — Telephone Encounter (Signed)
Daughter came in for o/v today and asked if I would rx albuterol neb sol'n for her mother, Leah Howell. She has COPD and has run out of this med. I sent in rx of this med today.

## 2016-09-12 ENCOUNTER — Encounter: Payer: Self-pay | Admitting: Family Medicine

## 2016-09-12 ENCOUNTER — Ambulatory Visit
Admission: RE | Admit: 2016-09-12 | Discharge: 2016-09-12 | Disposition: A | Discharge status: Home / Self Care | Payer: Medicare Other | Source: Ambulatory Visit | Attending: Family Medicine | Admitting: Family Medicine

## 2016-09-12 ENCOUNTER — Ambulatory Visit (INDEPENDENT_AMBULATORY_CARE_PROVIDER_SITE_OTHER): Payer: Medicare Other | Admitting: Family Medicine

## 2016-09-12 VITALS — BP 135/81 | HR 101 | Temp 98.3°F | Resp 16 | Wt 112.5 lb

## 2016-09-12 DIAGNOSIS — J441 Chronic obstructive pulmonary disease with (acute) exacerbation: Secondary | ICD-10-CM

## 2016-09-12 DIAGNOSIS — R5081 Fever presenting with conditions classified elsewhere: Secondary | ICD-10-CM | POA: Diagnosis not present

## 2016-09-12 DIAGNOSIS — Z85118 Personal history of other malignant neoplasm of bronchus and lung: Secondary | ICD-10-CM

## 2016-09-12 DIAGNOSIS — R05 Cough: Secondary | ICD-10-CM | POA: Diagnosis not present

## 2016-09-12 MED ORDER — LEVOFLOXACIN 500 MG PO TABS: 500.0000 mg | ORAL_TABLET | Freq: Every day | ORAL | 0 refills | Status: DC

## 2016-09-12 MED ORDER — PREDNISONE 20 MG PO TABS: ORAL_TABLET | ORAL | 0 refills | Status: DC

## 2016-09-12 NOTE — Progress Notes (Signed)
OFFICE VISIT  09/12/2016   CC:  Chief Complaint  Patient presents with  . Cough    cough x 4 months, Fever x 4 days   HPI:    Patient is a 71 y.o. Caucasian female who presents accompanied by her daughter for cough. Says cough "won't go away".  Onset was 4 mo ago, cough waxing and waning in intensity, gets better for 10d or so and then returns.  Thick, copious, green sputum but no hemoptysis. Temp 101 the last few days until today.  Has worse than baseline SOB/DOE the last 4 mo intermittently (similar course as cough).  Has taken ibup up until today. Has some post-tussive emesis in morning sometimes. Takes mucinex DM--no signif help.  Also claritin lately but no help. Runny nose lately--atrovent nasal spray helps.  No CP, no HA, no facial or upper teeth pain, no ST, no rash, no body aches.  No abd pain, no diarrhea.   Past Medical History:  Diagnosis Date  . COPD (chronic obstructive pulmonary disease) ([place])   . Personal history of lung cancer    2008; upper lobe R lung (Dr. Earlie Server).  Got chemo.  Has been on observation and monitoring with annual CT's of chest since 2009.  As of 02/2016 she has been "released" by oncology.     Past Surgical History:  Procedure Laterality Date  . MEDIASTINOSCOPY  2008   for bx of lung mass (Dr. Arlyce Dice)  . TUBAL LIGATION      Outpatient Medications Prior to Visit  Medication Sig Dispense Refill  . albuterol (ACCUNEB) 1.25 MG/3ML nebulizer solution Take 3 mLs (1.25 mg total) by nebulization every 6 (six) hours as needed. 75 mL 6  . budesonide-formoterol (SYMBICORT) 160-4.5 MCG/ACT inhaler Inhale 2 puffs into the lungs 2 (two) times daily. 3 Inhaler 3  . ipratropium (ATROVENT) 0.03 % nasal spray Place 2 sprays into both nostrils every 12 (twelve) hours. 30 mL 5   No facility-administered medications prior to visit.     No Known Allergies  ROS As per HPI  PE: Blood pressure 135/81, pulse (!) 101, temperature 98.3 F (36.8 C),  temperature source Oral, resp. rate 16, weight 112 lb 8 oz (51 kg), SpO2 98 %.Room air. VS: noted. Gen: alert, NAD, WELL-APPEARING. HEENT: eyes without injection, drainage, or swelling.  Ears: EACs clear, TMs with normal light reflex and landmarks.  Nose: Clear rhinorrhea, with some dried, crusty exudate adherent to mildly injected mucosa.  No purulent d/c.  No paranasal sinus TTP.  No facial swelling.  Throat and mouth without focal lesion.  No pharyngial swelling, erythema, or exudate.   Neck: supple, no LAD.   LUNGS: CTA bilat except for slight faint exp wheeze on left anterior lung field on a couple of breaths, nonlabored resps.  Mildly increased RR(about 20-24/min).   CV: Regular rhythm, tachy to 110, no m/r/g. EXT: no c/c/e SKIN: no rash  LABS:  None today.  IMPRESSION AND PLAN:  COPD exacerbation; waxing and waning x 4 mo.  Has been treated a couple times with relatively brief courses of steroids + abx, with only short term improvement. Recent fevers concerning for bacterial infection. Will check CXR today. Start levaquin 500 mg qd x 10d.  Prednisone '60mg'$  qd x 3d, then '40mg'$  qd x 7d, then '20mg'$  qd x 7d, then '10mg'$  qd x 8d, then stop. Signs/symptoms to call or return for were reviewed and pt expressed understanding.  An After Visit Summary was printed and given to the  patient.  FOLLOW UP: Return for 10-14d f/u COPD exacerbation.  Signed:  Crissie Sickles, MD           09/12/2016

## 2016-09-26 ENCOUNTER — Encounter: Payer: Self-pay | Admitting: Family Medicine

## 2016-09-26 ENCOUNTER — Ambulatory Visit (INDEPENDENT_AMBULATORY_CARE_PROVIDER_SITE_OTHER): Payer: Medicare Other | Admitting: Family Medicine

## 2016-09-26 VITALS — BP 126/65 | HR 83 | Temp 98.0°F | Resp 16 | Ht 64.5 in | Wt 112.8 lb

## 2016-09-26 DIAGNOSIS — J441 Chronic obstructive pulmonary disease with (acute) exacerbation: Secondary | ICD-10-CM

## 2016-09-26 NOTE — Progress Notes (Signed)
OFFICE VISIT  09/26/2016   CC:  Chief Complaint  Patient presents with  . Follow-up    COPD exacerbation    HPI:    Patient is a 70 y.o. Caucasian female who presents for 2 week f/u COPD exacerbation. Feeling much improved.  Much less SOB, less cough, less sputum production, no fevers. Taking steroids as Rx'd, finished abx, using albut neb less and less.  No CP, no LE swelling, no thrush, no HAs, no insomnia.  Past Medical History:  Diagnosis Date  . COPD (chronic obstructive pulmonary disease) ([place])   . Personal history of lung cancer    2008; upper lobe R lung (Dr. Earlie Server).  Got chemo.  Has been on observation and monitoring with annual CT's of chest since 2009.  As of 02/2016 she has been "released" by oncology.     Past Surgical History:  Procedure Laterality Date  . MEDIASTINOSCOPY  2008   for bx of lung mass (Dr. Arlyce Dice)  . TUBAL LIGATION      Outpatient Medications Prior to Visit  Medication Sig Dispense Refill  . albuterol (ACCUNEB) 1.25 MG/3ML nebulizer solution Take 3 mLs (1.25 mg total) by nebulization every 6 (six) hours as needed. 75 mL 6  . budesonide-formoterol (SYMBICORT) 160-4.5 MCG/ACT inhaler Inhale 2 puffs into the lungs 2 (two) times daily. 3 Inhaler 3  . ipratropium (ATROVENT) 0.03 % nasal spray Place 2 sprays into both nostrils every 12 (twelve) hours. 30 mL 5  . predniSONE (DELTASONE) 20 MG tablet 3 tabs po qd x 3d, then 2 tabs po qd x 7d, then 1 tab po qd x 7d, then 1/2 tab po qd x 8d, then stop. 34 tablet 0  . levofloxacin (LEVAQUIN) 500 MG tablet Take 1 tablet (500 mg total) by mouth daily. (Patient not taking: Reported on 09/26/2016) 10 tablet 0   No facility-administered medications prior to visit.     No Known Allergies  ROS As per HPI  PE: Blood pressure 126/65, pulse 83, temperature 98 F (36.7 C), temperature source Oral, resp. rate 16, height 5' 4.5" (1.638 m), weight 112 lb 12 oz (51.1 kg), SpO2 96 %. Gen: Alert, well appearing.   Patient is oriented to person, place, time, and situation. AFFECT: pleasant, lucid thought and speech. EGB:TDVV: no injection, icteris, swelling, or exudate.  EOMI, PERRLA. Mouth: lips without lesion/swelling.  Oral mucosa pink and moist. Oropharynx without erythema, exudate, or swelling.  CV: RRR, no m/r/g.   LUNGS: CTA bilat, nonlabored resps, good aeration in all lung fields. EXT: no clubbing, cyanosis, or edema.   LABS:  none  IMPRESSION AND PLAN:  COPD exacerbation--much improved: finish prednisone taper, ween off nebs. Signs/symptoms to call or return for were reviewed and pt expressed understanding.  An After Visit Summary was printed and given to the patient.  FOLLOW UP: Return for Keep appt already set for 02/2017..  Signed:  Crissie Sickles, MD           09/26/2016

## 2017-02-15 ENCOUNTER — Encounter: Payer: Self-pay | Admitting: Family Medicine

## 2017-02-15 ENCOUNTER — Ambulatory Visit (INDEPENDENT_AMBULATORY_CARE_PROVIDER_SITE_OTHER): Payer: Medicare Other | Admitting: Family Medicine

## 2017-02-15 VITALS — BP 131/69 | HR 72 | Temp 98.3°F | Resp 16 | Ht 64.5 in | Wt 113.8 lb

## 2017-02-15 DIAGNOSIS — E7800 Pure hypercholesterolemia, unspecified: Secondary | ICD-10-CM

## 2017-02-15 DIAGNOSIS — Z85118 Personal history of other malignant neoplasm of bronchus and lung: Secondary | ICD-10-CM | POA: Diagnosis not present

## 2017-02-15 DIAGNOSIS — J438 Other emphysema: Secondary | ICD-10-CM

## 2017-02-15 DIAGNOSIS — Z23 Encounter for immunization: Secondary | ICD-10-CM

## 2017-02-15 LAB — COMPREHENSIVE METABOLIC PANEL
ALBUMIN: 4 g/dL (ref 3.5–5.2)
ALT: 20 U/L (ref 0–35)
AST: 22 U/L (ref 0–37)
Alkaline Phosphatase: 67 U/L (ref 39–117)
BUN: 15 mg/dL (ref 6–23)
CHLORIDE: 104 meq/L (ref 96–112)
CO2: 27 meq/L (ref 19–32)
Calcium: 9.3 mg/dL (ref 8.4–10.5)
Creatinine, Ser: 0.98 mg/dL (ref 0.40–1.20)
GFR: 59.69 mL/min — ABNORMAL LOW (ref >60.00)
Glucose, Bld: 96 mg/dL (ref 70–99)
Potassium: 4.2 mEq/L (ref 3.5–5.1)
Sodium: 139 mEq/L (ref 135–145)
Total Bilirubin: 0.7 mg/dL (ref 0.2–1.2)
Total Protein: 6.3 g/dL (ref 6.0–8.3)

## 2017-02-15 LAB — LIPID PANEL
CHOL/HDL RATIO: 3
CHOLESTEROL: 222 mg/dL — AB (ref 0–200)
HDL: 87.9 mg/dL (ref >39.00)
LDL CALC: 113 mg/dL — AB (ref 0–99)
NonHDL: 134.35
Triglycerides: 105 mg/dL (ref 0.0–149.0)
VLDL: 21 mg/dL (ref 0.0–40.0)

## 2017-02-15 MED ORDER — BUDESONIDE-FORMOTEROL FUMARATE 160-4.5 MCG/ACT IN AERO: 2.0000 | INHALATION_SPRAY | Freq: Two times a day (BID) | RESPIRATORY_TRACT | 3 refills | Status: DC

## 2017-02-15 NOTE — Addendum Note (Signed)
Addended by: Onalee Hua on: 02/15/2017 10:06 AM   Modules accepted: Orders

## 2017-02-15 NOTE — Progress Notes (Signed)
OFFICE VISIT  02/15/2017   CC:  Chief Complaint  Patient presents with  . Follow-up    RCI, pt is not fasting.    HPI:    Patient is a 71 y.o. Caucasian female who presents for f/u COPD and remote hx of lung cancer.  As of 2017, she has been released from routine f/u by oncology. Feeling fine, breathing stable.  Some mild productive cough throughout her day---as per her usual. No SOB except with significant exertion.  No fevers.  No recent URI sx's. Compliant with symbicort.  Not requiring albuterol rescue.  She had one bite of ham today, o/w is fasting. Appetite is good, energy level is good.  No acute complaints.  Past Medical History:  Diagnosis Date  . COPD (chronic obstructive pulmonary disease) ([place])   . Hyperlipidemia    Mild--TLC  . Personal history of lung cancer    2008; upper lobe R lung (Dr. Earlie Server).  Got chemo.  Has been on observation and monitoring with annual CT's of chest since 2009.  As of 02/2016 she has been "released" by oncology.     Past Surgical History:  Procedure Laterality Date  . MEDIASTINOSCOPY  2008   for bx of lung mass (Dr. Arlyce Dice)  . TUBAL LIGATION      Outpatient Medications Prior to Visit  Medication Sig Dispense Refill  . albuterol (ACCUNEB) 1.25 MG/3ML nebulizer solution Take 3 mLs (1.25 mg total) by nebulization every 6 (six) hours as needed. 75 mL 6  . ipratropium (ATROVENT) 0.03 % nasal spray Place 2 sprays into both nostrils every 12 (twelve) hours. 30 mL 5  . budesonide-formoterol (SYMBICORT) 160-4.5 MCG/ACT inhaler Inhale 2 puffs into the lungs 2 (two) times daily. 3 Inhaler 3  . predniSONE (DELTASONE) 20 MG tablet 3 tabs po qd x 3d, then 2 tabs po qd x 7d, then 1 tab po qd x 7d, then 1/2 tab po qd x 8d, then stop. (Patient not taking: Reported on 02/15/2017) 34 tablet 0   No facility-administered medications prior to visit.     No Known Allergies  ROS As per HPI  PE: Blood pressure 131/69, pulse 72, temperature 98.3 F  (36.8 C), temperature source Oral, resp. rate 16, height 5' 4.5" (1.638 m), weight 113 lb 12 oz (51.6 kg), SpO2 99 %. Gen: Alert, well appearing.  Patient is oriented to person, place, time, and situation. AFFECT: pleasant, lucid thought and speech. CV: RRR, no m/r/g.   LUNGS: CTA bilat, nonlabored resps, good aeration in all lung fields. EXT: no clubbing, cyanosis, or edema.    LABS:    Chemistry      Component Value Date/Time   NA 140 02/16/2016 0955   K 4.4 02/16/2016 0955   CL 105 01/22/2012 0830   CO2 24 02/16/2016 0955   BUN 10.0 02/16/2016 0955   CREATININE 0.8 02/16/2016 0955      Component Value Date/Time   CALCIUM 9.5 02/16/2016 0955   ALKPHOS 70 02/16/2016 0955   AST 36 (H) 02/16/2016 0955   ALT 30 02/16/2016 0955   BILITOT 0.64 02/16/2016 0955     Lab Results  Component Value Date   WBC 4.1 08/17/2016   HGB 14.7 08/17/2016   HCT 44.0 08/17/2016   MCV 97.9 08/17/2016   PLT 174.0 08/17/2016   Lab Results  Component Value Date   CHOL 258 (H) 02/05/2014   HDL 81.90 02/05/2014   LDLCALC 147 (H) 02/05/2014   TRIG 148.0 02/05/2014   CHOLHDL  3 02/05/2014    IMPRESSION AND PLAN:  1) COPD; stable. The current medical regimen is effective;  continue present plan and medications. Flu vaccine today.  She declines pneumovax 23 today.  2) Hx of lung ca--remote past.  Completed long term surveillance and has been released from oncology f/u since 2017.  She has consistently declined screening tests such as mammogram, pap smears, and any kind of colon ca screening. She continued to decline these things today.  FLP and CMET if fasting (HLD).  An After Visit Summary was printed and given to the patient.  FOLLOW UP: Return in about 6 months (around 08/16/2017) for routine chronic illness f/u.  Signed:  Crissie Sickles, MD           02/15/2017

## 2017-04-19 ENCOUNTER — Other Ambulatory Visit: Payer: Self-pay

## 2017-05-22 ENCOUNTER — Encounter: Payer: Self-pay | Admitting: Family Medicine

## 2017-05-22 ENCOUNTER — Ambulatory Visit (INDEPENDENT_AMBULATORY_CARE_PROVIDER_SITE_OTHER): Payer: Medicare Other | Admitting: Family Medicine

## 2017-05-22 VITALS — BP 143/80 | HR 85 | Temp 98.0°F | Resp 16 | Ht 64.5 in | Wt 113.8 lb

## 2017-05-22 DIAGNOSIS — J441 Chronic obstructive pulmonary disease with (acute) exacerbation: Secondary | ICD-10-CM | POA: Diagnosis not present

## 2017-05-22 DIAGNOSIS — J069 Acute upper respiratory infection, unspecified: Secondary | ICD-10-CM

## 2017-05-22 MED ORDER — DOXYCYCLINE HYCLATE 100 MG PO CAPS: ORAL_CAPSULE | ORAL | 0 refills | Status: DC

## 2017-05-22 MED ORDER — PREDNISONE 20 MG PO TABS: ORAL_TABLET | ORAL | 0 refills | Status: DC

## 2017-05-22 NOTE — Patient Instructions (Signed)
Get otc generic robitussin DM OR Mucinex DM and use as directed on the packaging for cough and congestion. Use otc generic saline nasal spray 2-3 times per day to irrigate/moisturize your nasal passages.   

## 2017-05-22 NOTE — Progress Notes (Signed)
OFFICE VISIT  05/22/2017   CC:  Chief Complaint  Patient presents with  . URI     HPI:    Patient is a 72 y.o. Caucasian female who presents for respiratory complaints. Pt presents complaining of respiratory symptoms for 4  days.  Primary symptoms are: cough, worse in mornings, lots of thick mucous from nose/PND.  W Lately the symptoms seem to be staying the same. Pertinent negatives: No fevers, no wheezing, and no SOB.  No pain in face or teeth.  No significant HA.  ST mild at most.   Symptoms made better by dayquil/nyquil a little helpful.  Has not felt like she needs albuterol with this illness. Smoker? Former--quit 2004. Recent sick contact? no Muscle or joint aches? no Flu shot this season at least 2 wks ago? Yes--02/15/17.  Additional ROS: no n/v/d or abdominal pain.  No rash.  No neck stiffness.   +Mild fatigue.  +Mild appetite loss.   Past Medical History:  Diagnosis Date  . Chronic renal insufficiency, stage 2 (mild)    GFR 60s  . COPD (chronic obstructive pulmonary disease) ([place])   . Hyperlipidemia    Mild--TLC  . Personal history of lung cancer    2008; upper lobe R lung (Dr. Earlie Server).  Got chemo.  Has been on observation and monitoring with annual CT's of chest since 2009.  As of 02/2016 she has been "released" by oncology.     Past Surgical History:  Procedure Laterality Date  . MEDIASTINOSCOPY  2008   for bx of lung mass (Dr. Arlyce Dice)  . TUBAL LIGATION      Outpatient Medications Prior to Visit  Medication Sig Dispense Refill  . albuterol (ACCUNEB) 1.25 MG/3ML nebulizer solution Take 3 mLs (1.25 mg total) by nebulization every 6 (six) hours as needed. 75 mL 6  . budesonide-formoterol (SYMBICORT) 160-4.5 MCG/ACT inhaler Inhale 2 puffs into the lungs 2 (two) times daily. 3 Inhaler 3  . ipratropium (ATROVENT) 0.03 % nasal spray Place 2 sprays into both nostrils every 12 (twelve) hours. 30 mL 5   No facility-administered medications prior to visit.     No  Known Allergies  ROS As per HPI  PE: Blood pressure (!) 143/80, pulse 85, temperature 98 F (36.7 C), temperature source Oral, resp. rate 16, height 5' 4.5" (1.638 m), weight 113 lb 12 oz (51.6 kg), SpO2 99 %. VS: noted--normal. Gen: alert, NAD, NONTOXIC APPEARING. HEENT: eyes without injection, drainage, or swelling.  Ears: EACs clear, TMs with normal light reflex and landmarks.  Nose: Clear rhinorrhea, with some dried, crusty exudate adherent to mildly injected mucosa.  No purulent d/c.  No paranasal sinus TTP.  No facial swelling.  Throat and mouth without focal lesion.  No pharyngial swelling, erythema, or exudate.   Neck: supple, no LAD.   LUNGS: CTA bilat, nonlabored resps.  Very mild, trace end exp wheezing, but aeration is good and she has minimal prolongation of exp phase. CV: RRR, no m/r/g. EXT: no c/c/e SKIN: no rash  LABS:  none  IMPRESSION AND PLAN:  Prolonged URI with acute exacerbation of COPD. She looks pretty good today. Prednisone 40mg  qd x 5d, then 20mg  qd x 5d. Doxycycline 100 mg bid x 10d. [place] neb 2.5mg   q6h prn. Get otc generic robitussin DM OR Mucinex DM and use as directed on the packaging for cough and congestion. Use otc generic saline nasal spray 2-3 times per day to irrigate/moisturize your nasal passages.  An After Visit Summary was  printed and given to the patient.  FOLLOW UP: Return if symptoms worsen or fail to improve.  Signed:  Crissie Sickles, MD           05/22/2017

## 2017-08-16 ENCOUNTER — Ambulatory Visit (INDEPENDENT_AMBULATORY_CARE_PROVIDER_SITE_OTHER): Payer: Medicare Other | Admitting: Family Medicine

## 2017-08-16 ENCOUNTER — Encounter: Payer: Self-pay | Admitting: Family Medicine

## 2017-08-16 VITALS — BP 112/72 | HR 92 | Temp 98.1°F | Resp 16 | Ht 64.5 in | Wt 114.0 lb

## 2017-08-16 DIAGNOSIS — Z0000 Encounter for general adult medical examination without abnormal findings: Secondary | ICD-10-CM | POA: Diagnosis not present

## 2017-08-16 DIAGNOSIS — F409 Phobic anxiety disorder, unspecified: Secondary | ICD-10-CM | POA: Diagnosis not present

## 2017-08-16 DIAGNOSIS — N182 Chronic kidney disease, stage 2 (mild): Secondary | ICD-10-CM | POA: Diagnosis not present

## 2017-08-16 DIAGNOSIS — J449 Chronic obstructive pulmonary disease, unspecified: Secondary | ICD-10-CM | POA: Diagnosis not present

## 2017-08-16 DIAGNOSIS — F5105 Insomnia due to other mental disorder: Secondary | ICD-10-CM

## 2017-08-16 MED ORDER — CLONAZEPAM 0.5 MG PO TABS: ORAL_TABLET | ORAL | 5 refills | Status: DC

## 2017-08-16 NOTE — Progress Notes (Signed)
OFFICE VISIT  08/16/2017   CC:  Chief Complaint  Patient presents with  . Follow-up    RCI    HPI:    Patient is a 72 y.o. Caucasian female who presents for 6 mo f/u COPD, CRI with GFR in the 60s, remote hx of lung cancer. Uses symbicort and prn albuterol. Compliant with symbicort, not requiring albut at all.  CRI: rare use of ibuprofen.  Hydrates well.  ROS: no recent cough, no hemoptysis.  NO wt loss. Appetite is fine.  New c/o: maintenance insomnia x 6-8 mo  Initiates sleep fine at about 10 PM but wakes up a few hours later and can't go back to sleep.  Chronic worrier, mind won't shut down.  Not a problem in daytime b/c she keeps busy.   No RLS. She has not tried any otc meds.  Does not eat before bed, no coffee in evenings. No daytime naps. Has never been rx sleep med.  Past Medical History:  Diagnosis Date  . Chronic renal insufficiency, stage 2 (mild)    GFR 60s  . COPD (chronic obstructive pulmonary disease) ([place])   . Hyperlipidemia    Mild--TLC  . Personal history of lung cancer    2008; upper lobe R lung (Dr. Earlie Server).  Got chemo.  Has been on observation and monitoring with annual CT's of chest since 2009.  As of 02/2016 she has been "released" by oncology.     Past Surgical History:  Procedure Laterality Date  . MEDIASTINOSCOPY  2008   for bx of lung mass (Dr. Arlyce Dice)  . TUBAL LIGATION      Outpatient Medications Prior to Visit  Medication Sig Dispense Refill  . albuterol (ACCUNEB) 1.25 MG/3ML nebulizer solution Take 3 mLs (1.25 mg total) by nebulization every 6 (six) hours as needed. 75 mL 6  . budesonide-formoterol (SYMBICORT) 160-4.5 MCG/ACT inhaler Inhale 2 puffs into the lungs 2 (two) times daily. 3 Inhaler 3  . doxycycline (VIBRAMYCIN) 100 MG capsule 1 tab po bid x 10d 20 capsule 0  . ipratropium (ATROVENT) 0.03 % nasal spray Place 2 sprays into both nostrils every 12 (twelve) hours. 30 mL 5  . predniSONE (DELTASONE) 20 MG tablet 2 tabs po qd x 5d,  then 1 tab po qd x 5d (Patient not taking: Reported on 08/16/2017) 15 tablet 0   No facility-administered medications prior to visit.     No Known Allergies  ROS As per HPI  PE: Blood pressure 112/72, pulse 92, temperature 98.1 F (36.7 C), temperature source Oral, resp. rate 16, height 5' 4.5" (1.638 m), weight 114 lb (51.7 kg), SpO2 97 %. Gen: Alert, well appearing.  Patient is oriented to person, place, time, and situation. AFFECT: pleasant, lucid thought and speech. NOM:VEHM: no injection, icteris, swelling, or exudate.  EOMI, PERRLA. Mouth: lips without lesion/swelling.  Oral mucosa pink and moist. Oropharynx without erythema, exudate, or swelling.  CV: RRR, no m/r/g.   LUNGS: CTA bilat, nonlabored resps, good aeration in all lung fields. EXT: no clubbing, cyanosis, or edema.    LABS:    Chemistry      Component Value Date/Time   NA 139 02/15/2017 0952   NA 140 02/16/2016 0955   K 4.2 02/15/2017 0952   K 4.4 02/16/2016 0955   CL 104 02/15/2017 0952   CL 105 01/22/2012 0830   CO2 27 02/15/2017 0952   CO2 24 02/16/2016 0955   BUN 15 02/15/2017 0952   BUN 10.0 02/16/2016 0955  CREATININE 0.98 02/15/2017 0952   CREATININE 0.8 02/16/2016 0955      Component Value Date/Time   CALCIUM 9.3 02/15/2017 0952   CALCIUM 9.5 02/16/2016 0955   ALKPHOS 67 02/15/2017 0952   ALKPHOS 70 02/16/2016 0955   AST 22 02/15/2017 0952   AST 36 (H) 02/16/2016 0955   ALT 20 02/15/2017 0952   ALT 30 02/16/2016 0955   BILITOT 0.7 02/15/2017 0952   BILITOT 0.64 02/16/2016 0955     Lab Results  Component Value Date   CHOL 222 (H) 02/15/2017   HDL 87.90 02/15/2017   LDLCALC 113 (H) 02/15/2017   TRIG 105.0 02/15/2017   CHOLHDL 3 02/15/2017   Lab Results  Component Value Date   WBC 4.1 08/17/2016   HGB 14.7 08/17/2016   HCT 44.0 08/17/2016   MCV 97.9 08/17/2016   PLT 174.0 08/17/2016     IMPRESSION AND PLAN:  1) COPD: The current medical regimen is effective;  continue present  plan and medications. She quit smoking long ago.  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 Clonazepam 0.5mg , 1-2 qhs prn.  3) Preventative health care: pt declines any further mammograms.  4) CRI stage II (GFR 60s): rarely takes NSAID.  Hydrates well. Recheck BMET next f/u visit.  An After Visit Summary was printed and given to the patient.  FOLLOW UP: Return in about 6 months (around 02/15/2018) for routine chronic illness f/u.  Signed:  Crissie Sickles, MD           08/16/2017

## 2017-10-11 ENCOUNTER — Ambulatory Visit (INDEPENDENT_AMBULATORY_CARE_PROVIDER_SITE_OTHER): Payer: Medicare Other | Admitting: Family Medicine

## 2017-10-11 ENCOUNTER — Encounter: Payer: Self-pay | Admitting: Family Medicine

## 2017-10-11 VITALS — BP 143/75 | HR 85 | Temp 98.2°F | Resp 16 | Ht 64.5 in | Wt 113.1 lb

## 2017-10-11 DIAGNOSIS — J069 Acute upper respiratory infection, unspecified: Secondary | ICD-10-CM

## 2017-10-11 DIAGNOSIS — J441 Chronic obstructive pulmonary disease with (acute) exacerbation: Secondary | ICD-10-CM | POA: Diagnosis not present

## 2017-10-11 MED ORDER — AZITHROMYCIN 250 MG PO TABS: ORAL_TABLET | ORAL | 0 refills | Status: DC

## 2017-10-11 MED ORDER — IPRATROPIUM BROMIDE 0.03 % NA SOLN: 2.0000 | Freq: Two times a day (BID) | NASAL | 5 refills | Status: AC

## 2017-10-11 MED ORDER — PREDNISONE 20 MG PO TABS: ORAL_TABLET | ORAL | 0 refills | Status: DC

## 2017-10-11 NOTE — Progress Notes (Signed)
OFFICE VISIT  10/11/2017   CC:  Chief Complaint  Patient presents with  . Cough    HPI:    Patient is a 72 y.o. Caucasian female with COPD and a remote hx of lung ca who presents for cough. Onset about 3 wks ago.  Harsh morning cough, trying to get mucous up.  +Wheezing and chest rattle.  No change in her chronic SOB. Nose runny, no ST.  No pain in face.   Sx's staying the same.  No fevers. Has tried mucinex DM.  Past Medical History:  Diagnosis Date  . Chronic renal insufficiency, stage 2 (mild)    GFR 60s  . COPD (chronic obstructive pulmonary disease) ([place])   . Hyperlipidemia    Mild--TLC  . Personal history of lung cancer    2008; upper lobe R lung (Dr. Earlie Server).  Got chemo.  Has been on observation and monitoring with annual CT's of chest since 2009.  As of 02/2016 she has been "released" by oncology.     Past Surgical History:  Procedure Laterality Date  . MEDIASTINOSCOPY  2008   for bx of lung mass (Dr. Arlyce Dice)  . TUBAL LIGATION      Outpatient Medications Prior to Visit  Medication Sig Dispense Refill  . albuterol (ACCUNEB) 1.25 MG/3ML nebulizer solution Take 3 mLs (1.25 mg total) by nebulization every 6 (six) hours as needed. 75 mL 6  . budesonide-formoterol (SYMBICORT) 160-4.5 MCG/ACT inhaler Inhale 2 puffs into the lungs 2 (two) times daily. 3 Inhaler 3  . clonazePAM (KLONOPIN) 0.5 MG tablet 1-2 tabs po qhs prn insomnia and worry 60 tablet 5  . ipratropium (ATROVENT) 0.03 % nasal spray Place 2 sprays into both nostrils every 12 (twelve) hours. 30 mL 5  . doxycycline (VIBRAMYCIN) 100 MG capsule 1 tab po bid x 10d (Patient not taking: Reported on 10/11/2017) 20 capsule 0   No facility-administered medications prior to visit.     No Known Allergies  ROS As per HPI  PE: Blood pressure (!) 143/75, pulse 85, temperature 98.2 F (36.8 C), temperature source Oral, resp. rate 16, height 5' 4.5" (1.638 m), weight 113 lb 2 oz (51.3 kg), SpO2 98 %. VS:  noted--normal. Gen: alert, NAD, NONTOXIC APPEARING. HEENT: eyes without injection, drainage, or swelling.  Ears: EACs clear, TMs with normal light reflex and landmarks.  Nose: Clear rhinorrhea, with some dried, crusty exudate adherent to mildly injected mucosa.  No purulent d/c.  No paranasal sinus TTP.  No facial swelling.  Throat and mouth without focal lesion.  No pharyngial swelling, erythema, or exudate.   Neck: supple, no LAD.   LUNGS: diffuse exp wheezing but good aeration, exp phase mildly prolonged, nonlabored resps.   CV: RRR, no m/r/g. EXT: no c/c/e SKIN: no rash    LABS:    Chemistry      Component Value Date/Time   NA 139 02/15/2017 0952   NA 140 02/16/2016 0955   K 4.2 02/15/2017 0952   K 4.4 02/16/2016 0955   CL 104 02/15/2017 0952   CL 105 01/22/2012 0830   CO2 27 02/15/2017 0952   CO2 24 02/16/2016 0955   BUN 15 02/15/2017 0952   BUN 10.0 02/16/2016 0955   CREATININE 0.98 02/15/2017 0952   CREATININE 0.8 02/16/2016 0955      Component Value Date/Time   CALCIUM 9.3 02/15/2017 0952   CALCIUM 9.5 02/16/2016 0955   ALKPHOS 67 02/15/2017 0952   ALKPHOS 70 02/16/2016 0955   AST 22  02/15/2017 0952   AST 36 (H) 02/16/2016 0955   ALT 20 02/15/2017 0952   ALT 30 02/16/2016 0955   BILITOT 0.7 02/15/2017 0952   BILITOT 0.64 02/16/2016 0955       IMPRESSION AND PLAN:  URI with acute exacerbation of COPD. Prednisone 40 x 7d, 20 x 7d, then 10 x 8d. Zpack. Get otc generic robitussin DM OR Mucinex DM and use as directed on the packaging for cough and congestion. Use otc generic saline nasal spray 2-3 times per day to irrigate/moisturize your nasal passages. Continue symbicort, albuterol nebs.  An After Visit Summary was printed and given to the patient.  FOLLOW UP: Return if symptoms worsen or fail to improve.  Signed:  Crissie Sickles, MD           10/11/2017

## 2018-02-14 ENCOUNTER — Ambulatory Visit: Payer: Medicare Other | Admitting: Family Medicine

## 2018-02-28 ENCOUNTER — Ambulatory Visit (INDEPENDENT_AMBULATORY_CARE_PROVIDER_SITE_OTHER): Payer: Medicare Other | Admitting: Family Medicine

## 2018-02-28 ENCOUNTER — Encounter: Payer: Self-pay | Admitting: Family Medicine

## 2018-02-28 ENCOUNTER — Encounter: Payer: Self-pay | Admitting: *Deleted

## 2018-02-28 VITALS — BP 137/73 | HR 67 | Temp 97.7°F | Resp 16 | Ht 64.5 in | Wt 117.0 lb

## 2018-02-28 DIAGNOSIS — E7800 Pure hypercholesterolemia, unspecified: Secondary | ICD-10-CM | POA: Diagnosis not present

## 2018-02-28 DIAGNOSIS — J449 Chronic obstructive pulmonary disease, unspecified: Secondary | ICD-10-CM | POA: Diagnosis not present

## 2018-02-28 DIAGNOSIS — J438 Other emphysema: Secondary | ICD-10-CM

## 2018-02-28 DIAGNOSIS — Z23 Encounter for immunization: Secondary | ICD-10-CM | POA: Diagnosis not present

## 2018-02-28 DIAGNOSIS — F409 Phobic anxiety disorder, unspecified: Secondary | ICD-10-CM

## 2018-02-28 DIAGNOSIS — F5105 Insomnia due to other mental disorder: Secondary | ICD-10-CM

## 2018-02-28 DIAGNOSIS — N182 Chronic kidney disease, stage 2 (mild): Secondary | ICD-10-CM

## 2018-02-28 LAB — BASIC METABOLIC PANEL
BUN: 8 mg/dL (ref 6–23)
CALCIUM: 9.7 mg/dL (ref 8.4–10.5)
CO2: 27 mEq/L (ref 19–32)
Chloride: 101 mEq/L (ref 96–112)
Creatinine, Ser: 0.68 mg/dL (ref 0.40–1.20)
GFR: 90.74 mL/min (ref >60.00)
GLUCOSE: 79 mg/dL (ref 70–99)
Potassium: 4.3 mEq/L (ref 3.5–5.1)
Sodium: 138 mEq/L (ref 135–145)

## 2018-02-28 MED ORDER — ALBUTEROL SULFATE 1.25 MG/3ML IN NEBU: 1.0000 | INHALATION_SOLUTION | Freq: Four times a day (QID) | RESPIRATORY_TRACT | 6 refills | Status: AC | PRN

## 2018-02-28 MED ORDER — BUDESONIDE-FORMOTEROL FUMARATE 160-4.5 MCG/ACT IN AERO: 2.0000 | INHALATION_SPRAY | Freq: Two times a day (BID) | RESPIRATORY_TRACT | 3 refills | Status: AC

## 2018-02-28 NOTE — Progress Notes (Signed)
OFFICE VISIT  02/28/2018   CC:  Chief Complaint  Patient presents with  . Follow-up    RCI, pt is not fasting.    HPI:    Patient is a 72 y.o. Caucasian female who presents for 6 mo f/u COPD, mild CRI (borderline II/III), and anxiety.  Has hx of mild hyperlipidemia and she has declined statin med in the past.  We decided not to monitor this anymore.    COPD: doing well, compliant with symbicort, never has to use albuterol.  CRI: hydrates well.  Has no significant problem with pain, rarely uses NSAID.  Anxiety: uses 1-2 clonaz around bedtime for anxiety/worry-related insomnia.  This helps her sleep until 4-5 AM and she is happy that it helps her wake up without any excessive worry/ruminating thoughts about problems.  ROS: no CP, no SOB, no wheezing, no cough, no dizziness, no HAs, no rashes, no melena/hematochezia.  No polyuria or polydipsia.  No myalgias or arthralgias.  Past Medical History:  Diagnosis Date  . Chronic renal insufficiency, stage 2 (mild)    GFR 60s  . COPD (chronic obstructive pulmonary disease) ([place])   . Hyperlipidemia    Mild--TLC  . Personal history of lung cancer    2008; upper lobe R lung (Dr. Earlie Server).  Got chemo.  Has been on observation and monitoring with annual CT's of chest since 2009.  As of 02/2016 she has been "released" by oncology.     Past Surgical History:  Procedure Laterality Date  . MEDIASTINOSCOPY  2008   for bx of lung mass (Dr. Arlyce Dice)  . TUBAL LIGATION      Outpatient Medications Prior to Visit  Medication Sig Dispense Refill  . clonazePAM (KLONOPIN) 0.5 MG tablet 1-2 tabs po qhs prn insomnia and worry 60 tablet 5  . ipratropium (ATROVENT) 0.03 % nasal spray Place 2 sprays into both nostrils every 12 (twelve) hours. 30 mL 5  . albuterol (ACCUNEB) 1.25 MG/3ML nebulizer solution Take 3 mLs (1.25 mg total) by nebulization every 6 (six) hours as needed. 75 mL 6  . budesonide-formoterol (SYMBICORT) 160-4.5 MCG/ACT inhaler Inhale 2  puffs into the lungs 2 (two) times daily. 3 Inhaler 3  . azithromycin (ZITHROMAX) 250 MG tablet 2 tabs po qd x 1d, then 1 tab po qd x 4d (Patient not taking: Reported on 02/28/2018) 6 tablet 0  . predniSONE (DELTASONE) 20 MG tablet 2 tabs po qd x 7d, then 1 tab po qd x 7d, then 1/2 tab po qd x 8d (Patient not taking: Reported on 02/28/2018) 25 tablet 0   No facility-administered medications prior to visit.     No Known Allergies  ROS As per HPI  PE: Blood pressure 137/73, pulse 67, temperature 97.7 F (36.5 C), temperature source Oral, resp. rate 16, height 5' 4.5" (1.638 m), weight 117 lb (53.1 kg), SpO2 98 %. Gen: Alert, well appearing.  Patient is oriented to person, place, time, and situation. AFFECT: pleasant, lucid thought and speech. ION:GEXB: no injection, icteris, swelling, or exudate.  EOMI, PERRLA. Mouth: lips without lesion/swelling.  Oral mucosa pink and moist. Oropharynx without erythema, exudate, or swelling.  CV: RRR, no m/r/g.   LUNGS: CTA bilat, nonlabored resps, good aeration in all lung fields.  Mild prolongation of exp phase.   EXT: no clubbing or cyanosis.  no edema.    LABS:  No results found for: TSH Lab Results  Component Value Date   WBC 4.1 08/17/2016   HGB 14.7 08/17/2016   HCT  44.0 08/17/2016   MCV 97.9 08/17/2016   PLT 174.0 08/17/2016   Lab Results  Component Value Date   CREATININE 0.98 02/15/2017   BUN 15 02/15/2017   NA 139 02/15/2017   K 4.2 02/15/2017   CL 104 02/15/2017   CO2 27 02/15/2017   Lab Results  Component Value Date   ALT 20 02/15/2017   AST 22 02/15/2017   ALKPHOS 67 02/15/2017   BILITOT 0.7 02/15/2017   Lab Results  Component Value Date   CHOL 222 (H) 02/15/2017   Lab Results  Component Value Date   HDL 87.90 02/15/2017   Lab Results  Component Value Date   LDLCALC 113 (H) 02/15/2017   Lab Results  Component Value Date   TRIG 105.0 02/15/2017   Lab Results  Component Value Date   CHOLHDL 3 02/15/2017      IMPRESSION AND PLAN:  1) COPD: The current medical regimen is effective;  continue present plan and medications. RF'd albut neb sol'n today.  2) CRI II/III: avoids NSAIDs and hydrates well.  3) Anxiety -related insomnia. The current medical regimen is effective;  continue present plan and medications. Controlled substance contract reviewed with patient today.  Patient signed this and it will be placed in the chart.   UDS next f/u visit. No new rx for clonazepam was needed today.  4) hx of hyperlipidemia: TLC in the past.  Lipid repeat 1 yr ago was fine. We have decided to do no more lipid monitoring.  5) flu vaccine today, but pt declines pneumovax 23.  An After Visit Summary was printed and given to the patient.  FOLLOW UP: Return in about 6 months (around 08/30/2018) for routine chronic illness f/u.  Signed:  Crissie Sickles, MD           02/28/2018

## 2018-03-01 ENCOUNTER — Other Ambulatory Visit: Payer: Self-pay

## 2018-03-01 MED ORDER — CLONAZEPAM 0.5 MG PO TABS: ORAL_TABLET | ORAL | 3 refills | Status: DC

## 2018-03-11 ENCOUNTER — Telehealth: Payer: Self-pay | Admitting: Family Medicine

## 2018-03-11 NOTE — Telephone Encounter (Signed)
Copied from [place] [id number]. Topic: Quick Communication - See Telephone Encounter >> Mar 11, 2018  9:40 AM Blase Mess A wrote: CRM for notification. See Telephone encounter for: 03/11/18.  Patient is calling because she was seen in the office on 02/28/18 by Dr. Anitra Lauth Patient has a bad cold.  She is requesting if Dr. Anitra Lauth could send a script in for prednisone.  Patient is coughing so hard and to the point that she throws up and mucus is coming up.   Her preferred pharmacy is [facility], [place] - [id number] [place] [address] Phone: [phone] Fax: [phone]

## 2018-03-11 NOTE — Telephone Encounter (Signed)
Pt advised and voiced understanding.    Apt made for 03/14/18 at 9:45am.

## 2018-03-11 NOTE — Telephone Encounter (Signed)
Please advise. Thanks.  

## 2018-03-11 NOTE — Telephone Encounter (Signed)
Tell her sorry but I have to see her in the office for this-->can't just call in prednisone.-thx

## 2018-03-14 ENCOUNTER — Encounter: Payer: Self-pay | Admitting: Family Medicine

## 2018-03-14 ENCOUNTER — Ambulatory Visit (INDEPENDENT_AMBULATORY_CARE_PROVIDER_SITE_OTHER): Payer: Medicare Other | Admitting: Family Medicine

## 2018-03-14 VITALS — BP 143/80 | HR 102 | Temp 97.9°F | Resp 16 | Ht 64.5 in | Wt 116.2 lb

## 2018-03-14 DIAGNOSIS — J441 Chronic obstructive pulmonary disease with (acute) exacerbation: Secondary | ICD-10-CM

## 2018-03-14 DIAGNOSIS — J069 Acute upper respiratory infection, unspecified: Secondary | ICD-10-CM

## 2018-03-14 MED ORDER — AZITHROMYCIN 250 MG PO TABS: ORAL_TABLET | ORAL | 0 refills | Status: DC

## 2018-03-14 MED ORDER — PREDNISONE 20 MG PO TABS: ORAL_TABLET | ORAL | 0 refills | Status: DC

## 2018-03-14 MED ORDER — METHYLPREDNISOLONE ACETATE 80 MG/ML IJ SUSP
80.0000 mg | Freq: Once | INTRAMUSCULAR | Status: AC
  Administered 2018-03-14: 80 mg via INTRAMUSCULAR

## 2018-03-14 NOTE — Progress Notes (Signed)
OFFICE VISIT  03/14/2018   CC:  Chief Complaint  Patient presents with  . Cough    HPI:    Patient is a 72 y.o. Caucasian female with COPD who presents for respiratory symptoms. At least 10d cough, wheezing, mucous production, SOB, runny nose.  NO fevers.  Mucous dull white, thick in AM. Albuterol neb tid to qid NOT helping.  Eating/drinking fine.  No n/v/d or rash.  No ST.   Past Medical History:  Diagnosis Date  . Chronic renal insufficiency, stage 2 (mild)    GFR 60s  . COPD (chronic obstructive pulmonary disease) ([place])   . Hyperlipidemia    Mild--TLC  . Personal history of lung cancer    2008; upper lobe R lung (Dr. Earlie Server).  Got chemo.  Has been on observation and monitoring with annual CT's of chest since 2009.  As of 02/2016 she has been "released" by oncology.     Past Surgical History:  Procedure Laterality Date  . MEDIASTINOSCOPY  2008   for bx of lung mass (Dr. Arlyce Dice)  . TUBAL LIGATION      Outpatient Medications Prior to Visit  Medication Sig Dispense Refill  . albuterol (ACCUNEB) 1.25 MG/3ML nebulizer solution Take 3 mLs (1.25 mg total) by nebulization every 6 (six) hours as needed. 75 mL 6  . budesonide-formoterol (SYMBICORT) 160-4.5 MCG/ACT inhaler Inhale 2 puffs into the lungs 2 (two) times daily. 3 Inhaler 3  . clonazePAM (KLONOPIN) 0.5 MG tablet 1-2 tabs po qhs prn insomnia and worry 60 tablet 3  . ipratropium (ATROVENT) 0.03 % nasal spray Place 2 sprays into both nostrils every 12 (twelve) hours. 30 mL 5   No facility-administered medications prior to visit.     No Known Allergies  ROS As per HPI  PE: Blood pressure (!) 143/80, pulse (!) 102, temperature 97.9 F (36.6 C), temperature source Oral, resp. rate 16, height 5' 4.5" (1.638 m), weight 116 lb 4 oz (52.7 kg), SpO2 98 %. VS: noted--normal. Gen: alert, NAD, NONTOXIC APPEARING. HEENT: eyes without injection, drainage, or swelling.  Ears: EACs clear, TMs with normal light reflex and  landmarks.  Nose: Clear rhinorrhea, with some dried, crusty exudate adherent to mildly injected mucosa.  No purulent d/c.  No paranasal sinus TTP.  No facial swelling.  Throat and mouth without focal lesion.  No pharyngial swelling, erythema, or exudate.   Neck: supple, no LAD.   LUNGS: Clear on inspiration with pretty good aeration, but on exhalation she has soft wheezing and diminished aeration, with prolonged exp phase, minimally labored resps.  NOT in resp distress. CV: Regular, rate about 100-105, no m/r/g. EXT: no c/c/e SKIN: no rash   LABS:    Chemistry      Component Value Date/Time   NA 138 02/28/2018 1007   NA 140 02/16/2016 0955   K 4.3 02/28/2018 1007   K 4.4 02/16/2016 0955   CL 101 02/28/2018 1007   CL 105 01/22/2012 0830   CO2 27 02/28/2018 1007   CO2 24 02/16/2016 0955   BUN 8 02/28/2018 1007   BUN 10.0 02/16/2016 0955   CREATININE 0.68 02/28/2018 1007   CREATININE 0.8 02/16/2016 0955      Component Value Date/Time   CALCIUM 9.7 02/28/2018 1007   CALCIUM 9.5 02/16/2016 0955   ALKPHOS 67 02/15/2017 0952   ALKPHOS 70 02/16/2016 0955   AST 22 02/15/2017 0952   AST 36 (H) 02/16/2016 0955   ALT 20 02/15/2017 0952   ALT 30  02/16/2016 0955   BILITOT 0.7 02/15/2017 0952   BILITOT 0.64 02/16/2016 0955       IMPRESSION AND PLAN:  URI with acute exacerbation of COPD: she is not in resp distress and she declined a neb in office today. Depo medrol 80mg  IM today in office. Start prednisone 40mg  qd x 5d, then 20mg  qd x 5d. Zpack eRx'd. Continue mucinex DM and ipratropium nasal spray and saline nasal spray. Continue q4h albuterol neb at home.  An After Visit Summary was printed and given to the patient.  FOLLOW UP: Return if symptoms worsen or fail to improve.  Signed:  Crissie Sickles, MD           03/14/2018

## 2018-05-13 ENCOUNTER — Ambulatory Visit: Payer: Self-pay

## 2018-05-13 ENCOUNTER — Encounter: Payer: Self-pay | Admitting: *Deleted

## 2018-05-13 ENCOUNTER — Ambulatory Visit (INDEPENDENT_AMBULATORY_CARE_PROVIDER_SITE_OTHER): Payer: Medicare Other | Admitting: Family Medicine

## 2018-05-13 ENCOUNTER — Other Ambulatory Visit: Payer: Self-pay | Admitting: Family Medicine

## 2018-05-13 ENCOUNTER — Encounter: Payer: Self-pay | Admitting: Family Medicine

## 2018-05-13 VITALS — BP 151/81 | HR 90 | Temp 98.0°F | Resp 16 | Ht 64.5 in | Wt 117.1 lb

## 2018-05-13 DIAGNOSIS — J441 Chronic obstructive pulmonary disease with (acute) exacerbation: Secondary | ICD-10-CM

## 2018-05-13 DIAGNOSIS — J449 Chronic obstructive pulmonary disease, unspecified: Secondary | ICD-10-CM

## 2018-05-13 MED ORDER — PREDNISONE 20 MG PO TABS: ORAL_TABLET | ORAL | 0 refills | Status: DC

## 2018-05-13 MED ORDER — ALBUTEROL SULFATE HFA 108 (90 BASE) MCG/ACT IN AERS: 1.0000 | INHALATION_SPRAY | RESPIRATORY_TRACT | 1 refills | Status: DC | PRN

## 2018-05-13 MED ORDER — IPRATROPIUM-ALBUTEROL 0.5-2.5 (3) MG/3ML IN SOLN
3.0000 mL | Freq: Once | RESPIRATORY_TRACT | Status: AC
  Administered 2018-05-13: 3 mL via RESPIRATORY_TRACT

## 2018-05-13 NOTE — Telephone Encounter (Signed)
Noted  

## 2018-05-13 NOTE — Progress Notes (Signed)
OFFICE VISIT  05/13/2018   CC:  Chief Complaint  Patient presents with  . Cough     HPI:    Patient is a 72 y.o. Caucasian female with COPD who presents for cough and wheeze. Her most recent COPD flare was about 2 mo ago.  Onset 4 d/a, ST and chest tightness, coughing (nonproductive), feeling some DOE but no SOB at rest. No fever.  Occ mucous production is clear.  No chest pain. Albut neb helps a little bit--using it several times a day right now. Taking mucinex and dayquil.    Past Medical History:  Diagnosis Date  . Chronic renal insufficiency, stage 2 (mild)    GFR 60s  . COPD (chronic obstructive pulmonary disease) ([place])   . Hyperlipidemia    Mild--TLC  . Personal history of lung cancer    2008; upper lobe R lung (Dr. Earlie Server).  Got chemo.  Has been on observation and monitoring with annual CT's of chest since 2009.  As of 02/2016 she has been "released" by oncology.     Past Surgical History:  Procedure Laterality Date  . MEDIASTINOSCOPY  2008   for bx of lung mass (Dr. Arlyce Dice)  . TUBAL LIGATION      Outpatient Medications Prior to Visit  Medication Sig Dispense Refill  . albuterol (ACCUNEB) 1.25 MG/3ML nebulizer solution Take 3 mLs (1.25 mg total) by nebulization every 6 (six) hours as needed. 75 mL 6  . budesonide-formoterol (SYMBICORT) 160-4.5 MCG/ACT inhaler Inhale 2 puffs into the lungs 2 (two) times daily. 3 Inhaler 3  . clonazePAM (KLONOPIN) 0.5 MG tablet 1-2 tabs po qhs prn insomnia and worry 60 tablet 3  . ipratropium (ATROVENT) 0.03 % nasal spray Place 2 sprays into both nostrils every 12 (twelve) hours. 30 mL 5  . azithromycin (ZITHROMAX) 250 MG tablet 2 tabs po qd x 1d, then 1 tab po qd x 4d (Patient not taking: Reported on 05/13/2018) 6 tablet 0  . predniSONE (DELTASONE) 20 MG tablet 2 tabs po qd x 5d, then 1 tab po qd x 5d (Patient not taking: Reported on 05/13/2018) 15 tablet 0   No facility-administered medications prior to visit.     No  Known Allergies  ROS As per HPI  PE: Blood pressure (!) 151/81, pulse 90, temperature 98 F (36.7 C), temperature source Oral, resp. rate 16, height 5' 4.5" (1.638 m), weight 117 lb 2 oz (53.1 kg), SpO2 97 %.  RR is 30/min. Gen: Alert, well appearing.  Patient is oriented to person, place, time, and situation. AFFECT: pleasant, lucid thought and speech. Neck - No masses or thyromegaly or limitation in range of motion CV: RRR, no m/r/g.   LUNGS: diffuse insp/exp wheezing, diminished aeration diffusely, with prolonged exp phase, RR 30/min. EXT: no clubbing or cyanosis.  no edema.     LABS:    Chemistry      Component Value Date/Time   NA 138 02/28/2018 1007   NA 140 02/16/2016 0955   K 4.3 02/28/2018 1007   K 4.4 02/16/2016 0955   CL 101 02/28/2018 1007   CL 105 01/22/2012 0830   CO2 27 02/28/2018 1007   CO2 24 02/16/2016 0955   BUN 8 02/28/2018 1007   BUN 10.0 02/16/2016 0955   CREATININE 0.68 02/28/2018 1007   CREATININE 0.8 02/16/2016 0955      Component Value Date/Time   CALCIUM 9.7 02/28/2018 1007   CALCIUM 9.5 02/16/2016 0955   ALKPHOS 67 02/15/2017 0952  ALKPHOS 70 02/16/2016 0955   AST 22 02/15/2017 0952   AST 36 (H) 02/16/2016 0955   ALT 20 02/15/2017 0952   ALT 30 02/16/2016 0955   BILITOT 0.7 02/15/2017 0952   BILITOT 0.64 02/16/2016 0955      IMPRESSION AND PLAN:  URI, with acute exac of COPD: Alb/atr neb in office today: felt signif improvement in ability to breath deep and had less wheezing, a bit of a shorter duration exp phase, RR decreased, HR ok at 100. Rx for new home neb machine will be given today (hers is > 60 yrs old). Albuterol HFA for use outside of home. Prednisone 40mg  qd x 5d, then 20 mg qd x 5d.  An After Visit Summary was printed and given to the patient.  FOLLOW UP: Return 5 days, f/u COPD exacerbation.  Signed:  Crissie Sickles, MD           05/13/2018

## 2018-05-13 NOTE — Addendum Note (Signed)
Addended by: Onalee Hua on: 05/13/2018 04:10 PM   Modules accepted: Orders

## 2018-05-13 NOTE — Telephone Encounter (Signed)
Pt. Reports she started coughing this past Friday.Has clear mucus with this. Using her nebulizer at home, "but it is not helping much." "I can hear wheezing and rattling in my chest." Also using her Symbicort. Denies fever. Appointment made for today.  Reason for Disposition . [1] Continuous (nonstop) coughing interferes with work or school AND [2] no improvement using cough treatment per protocol  Answer Assessment - Initial Assessment Questions 1. ONSET: "When did the cough begin?"      Started last Friday 2. SEVERITY: "How bad is the cough today?"      Severe 3. RESPIRATORY DISTRESS: "Describe your breathing."      Short of breath with exertion 4. FEVER: "Do you have a fever?" If so, ask: "What is your temperature, how was it measured, and when did it start?"     No 5. HEMOPTYSIS: "Are you coughing up any blood?" If so ask: "How much?" (flecks, streaks, tablespoons, etc.)     No 6. TREATMENT: "What have you done so far to treat the cough?" (e.g., meds, fluids, humidifier)     Symbicort, Dayquil, nebulizer  7. CARDIAC HISTORY: "Do you have any history of heart disease?" (e.g., heart attack, congestive heart failure)      No  8. LUNG HISTORY: "Do you have any history of lung disease?"  (e.g., pulmonary embolus, asthma, emphysema)     copd 9. PE RISK FACTORS: "Do you have a history of blood clots?" (or: recent major surgery, recent prolonged travel, bedridden)     No 10. OTHER SYMPTOMS: "Do you have any other symptoms? (e.g., runny nose, wheezing, chest pain)       Rattling and wheezing 11. PREGNANCY: "Is there any chance you are pregnant?" "When was your last menstrual period?"       n/a 12. TRAVEL: "Have you traveled out of the country in the last month?" (e.g., travel history, exposures)       No  Protocols used: COUGH - ACUTE NON-PRODUCTIVE-A-AH

## 2018-05-21 ENCOUNTER — Encounter: Payer: Self-pay | Admitting: Family Medicine

## 2018-05-21 ENCOUNTER — Ambulatory Visit (INDEPENDENT_AMBULATORY_CARE_PROVIDER_SITE_OTHER): Payer: Medicare Other | Admitting: Family Medicine

## 2018-05-21 VITALS — BP 138/78 | HR 79 | Temp 97.8°F | Resp 16 | Ht 64.5 in | Wt 116.2 lb

## 2018-05-21 DIAGNOSIS — J441 Chronic obstructive pulmonary disease with (acute) exacerbation: Secondary | ICD-10-CM | POA: Diagnosis not present

## 2018-05-21 MED ORDER — PREDNISONE 10 MG PO TABS: ORAL_TABLET | ORAL | 0 refills | Status: DC

## 2018-05-21 NOTE — Progress Notes (Signed)
OFFICE VISIT  05/21/2018   CC:  Chief Complaint  Patient presents with  . Follow-up    COPD exacerbation     HPI:    Patient is a 73 y.o. Caucasian female who presents for 1 week f/u COPD exacerbation. Last visit I rx'd prednisone taper (40 qd x 5d, then 20 qd x 5d) and discussed scheduled albut nebs q4-6h with her.  Interim Hx: Says she is about 50% improved: still with rattly cough that is worse in mornings, some SOB with walking around. No CP or fever.  Denies wheezing.  Hears a rattle.   The last 2-3 d she has required less frequent albuterol nebs. Taking mucinex.  Past Medical History:  Diagnosis Date  . Chronic renal insufficiency, stage 2 (mild)    GFR 60s  . COPD (chronic obstructive pulmonary disease) ([place])   . Hyperlipidemia    Mild--TLC  . Personal history of lung cancer    2008; upper lobe R lung (Dr. Earlie Server).  Got chemo.  Has been on observation and monitoring with annual CT's of chest since 2009.  As of 02/2016 she has been "released" by oncology.     Past Surgical History:  Procedure Laterality Date  . MEDIASTINOSCOPY  2008   for bx of lung mass (Dr. Arlyce Dice)  . TUBAL LIGATION      Outpatient Medications Prior to Visit  Medication Sig Dispense Refill  . albuterol (ACCUNEB) 1.25 MG/3ML nebulizer solution Take 3 mLs (1.25 mg total) by nebulization every 6 (six) hours as needed. 75 mL 6  . albuterol (VENTOLIN HFA) 108 (90 Base) MCG/ACT inhaler Inhale 1-2 puffs into the lungs every 4 (four) hours as needed for wheezing or shortness of breath. 1 Inhaler 1  . budesonide-formoterol (SYMBICORT) 160-4.5 MCG/ACT inhaler Inhale 2 puffs into the lungs 2 (two) times daily. 3 Inhaler 3  . clonazePAM (KLONOPIN) 0.5 MG tablet 1-2 tabs po qhs prn insomnia and worry 60 tablet 3  . ipratropium (ATROVENT) 0.03 % nasal spray Place 2 sprays into both nostrils every 12 (twelve) hours. 30 mL 5  . predniSONE (DELTASONE) 20 MG tablet Take 2 tab daily for 5 days then 1 tab daily  for 5 days 15 tablet 0   No facility-administered medications prior to visit.     No Known Allergies  ROS As per HPI  PE: Blood pressure 138/78, pulse 79, temperature 97.8 F (36.6 C), temperature source Oral, resp. rate 16, height 5' 4.5" (1.638 m), weight 116 lb 4 oz (52.7 kg), SpO2 99 %. Gen: Alert, well appearing.  Patient is oriented to person, place, time, and situation. AFFECT: pleasant, lucid thought and speech. WNI:OEVO: no injection, icteris, swelling, or exudate.  EOMI, PERRLA. Mouth: lips without lesion/swelling.  Oral mucosa pink and moist. Oropharynx without erythema, exudate, or swelling.  CV: RRR, no m/r/g.   LUNGS: trace, soft exp wheeze, nonlabored resps, good aeration in all lung fields. EXT: no clubbing or cyanosis.  no edema.    LABS:  None today  IMPRESSION AND PLAN:  COPD exacerbation. Improving appropriately, but she usually takes a little longer course of steroids to get over this. She'll finish her 3 more days of 20mg  qd prednisone, plus I added on an additional 5 days of 10mg  qd prednisone today. Continue albuterol nebs q4-6h prn, continue mucinex.  An After Visit Summary was printed and given to the patient.  FOLLOW UP: Return if symptoms worsen or fail to improve.  Signed:  Crissie Sickles, MD  05/21/2018     

## 2018-06-15 DIAGNOSIS — C3412 Malignant neoplasm of upper lobe, left bronchus or lung: Secondary | ICD-10-CM

## 2018-06-15 HISTORY — DX: Malignant neoplasm of upper lobe, left bronchus or lung: C34.12

## 2018-06-17 ENCOUNTER — Ambulatory Visit: Payer: Self-pay | Admitting: *Deleted

## 2018-06-17 NOTE — Telephone Encounter (Signed)
Noted  

## 2018-06-17 NOTE — Telephone Encounter (Signed)
Pt saw Dr. Anitra Lauth 05/03/18 and 05/21/2018  'COPD exacerbation.'   Pt calling today to report "Cough much better but chest tightness the same."  States not worsening, "Just the same,"  no improvement since last appt.  States has been using nebs 1-3 x's daily but did not use yesterday. States has completed course of prednisone. Reports cough remains "But much better." Productive for clear phlegm, minimal amounts.  States SOB with exertion, none at rest. Denies wheezing. Also reports eyes, sinus area swollen, non- tender. States "Started swelling with prednisone."  Speech non-halting during call.   Appt made for tomorrow AM per pt's schedule; declined earlier appt available this afternoon, pt at work. Care advise given per protocol.  Reason for Disposition . [1] Known COPD or other severe lung disease (i.e., bronchiectasis, cystic fibrosis, lung surgery) AND [2] worsening symptoms (i.e., increased sputum purulence or amount, increased breathing difficulty  Answer Assessment - Initial Assessment Questions 1. ONSET: "When did the cough begin?"      05/21/2018, saw Dr. Anitra Lauth 2. SEVERITY: "How bad is the cough today?"      Mild 3. RESPIRATORY DISTRESS: "Describe your breathing."     SOB with exertion, none at rest. 4. FEVER: "Do you have a fever?" If so, ask: "What is your temperature, how was it measured, and when did it start?"     no 5. SPUTUM: "Describe the color of your sputum" (clear, white, yellow, green)     clear 6. HEMOPTYSIS: "Are you coughing up any blood?" If so ask: "How much?" (flecks, streaks, tablespoons, etc.)     no 7. CARDIAC HISTORY: "Do you have any history of heart disease?" (e.g., heart attack, congestive heart failure)      no 8. LUNG HISTORY: "Do you have any history of lung disease?"  (e.g., pulmonary embolus, asthma, emphysema)    COPD, H/O lung CA 9. PE RISK FACTORS: "Do you have a history of blood clots?" (or: recent major surgery, recent prolonged travel, bedridden)  no 10. OTHER SYMPTOMS: "Do you have any other symptoms?" (e.g., runny nose, wheezing, chest pain)       Chest tightness "Like before, no worse but hasn't gotten better." denies wheezing.  Protocols used: COUGH - ACUTE PRODUCTIVE-A-AH

## 2018-06-18 ENCOUNTER — Ambulatory Visit (INDEPENDENT_AMBULATORY_CARE_PROVIDER_SITE_OTHER): Payer: Medicare Other | Admitting: Family Medicine

## 2018-06-18 ENCOUNTER — Other Ambulatory Visit: Payer: Self-pay | Admitting: Family Medicine

## 2018-06-18 ENCOUNTER — Telehealth: Payer: Self-pay | Admitting: Family Medicine

## 2018-06-18 ENCOUNTER — Encounter: Payer: Self-pay | Admitting: Family Medicine

## 2018-06-18 ENCOUNTER — Ambulatory Visit (HOSPITAL_COMMUNITY)
Admission: RE | Admit: 2018-06-18 | Discharge: 2018-06-18 | Disposition: A | Discharge status: Home / Self Care | Payer: Medicare Other | Source: Ambulatory Visit | Attending: Family Medicine | Admitting: Family Medicine

## 2018-06-18 VITALS — BP 143/84 | HR 100 | Temp 97.7°F | Resp 16 | Ht 64.5 in | Wt 117.1 lb

## 2018-06-18 DIAGNOSIS — R220 Localized swelling, mass and lump, head: Secondary | ICD-10-CM

## 2018-06-18 DIAGNOSIS — Z85118 Personal history of other malignant neoplasm of bronchus and lung: Secondary | ICD-10-CM

## 2018-06-18 DIAGNOSIS — R0602 Shortness of breath: Secondary | ICD-10-CM | POA: Insufficient documentation

## 2018-06-18 DIAGNOSIS — R918 Other nonspecific abnormal finding of lung field: Secondary | ICD-10-CM

## 2018-06-18 DIAGNOSIS — J449 Chronic obstructive pulmonary disease, unspecified: Secondary | ICD-10-CM | POA: Diagnosis not present

## 2018-06-18 DIAGNOSIS — J441 Chronic obstructive pulmonary disease with (acute) exacerbation: Secondary | ICD-10-CM | POA: Diagnosis not present

## 2018-06-18 DIAGNOSIS — R0609 Other forms of dyspnea: Secondary | ICD-10-CM | POA: Diagnosis not present

## 2018-06-18 LAB — CBC WITH DIFFERENTIAL/PLATELET
BASOS ABS: 0 10*3/uL (ref 0.0–0.1)
Basophils Relative: 1 % (ref 0.0–3.0)
EOS ABS: 0.2 10*3/uL (ref 0.0–0.7)
Eosinophils Relative: 5.1 % — ABNORMAL HIGH (ref 0.0–5.0)
HCT: 43.6 % (ref 36.0–46.0)
Hemoglobin: 14.7 g/dL (ref 12.0–15.0)
Lymphocytes Relative: 13.8 % (ref 12.0–46.0)
Lymphs Abs: 0.6 10*3/uL — ABNORMAL LOW (ref 0.7–4.0)
MCHC: 33.8 g/dL (ref 30.0–36.0)
MCV: 103.1 fl — ABNORMAL HIGH (ref 78.0–100.0)
Monocytes Absolute: 0.4 10*3/uL (ref 0.1–1.0)
Monocytes Relative: 11.1 % (ref 3.0–12.0)
NEUTROS ABS: 2.8 10*3/uL (ref 1.4–7.7)
Neutrophils Relative %: 69 % (ref 43.0–77.0)
PLATELETS: 220 10*3/uL (ref 150.0–400.0)
RBC: 4.23 Mil/uL (ref 3.87–5.11)
RDW: 13.5 % (ref 11.5–15.5)
WBC: 4 10*3/uL (ref 4.0–10.5)

## 2018-06-18 LAB — POCT I-STAT CREATININE: Creatinine, Ser: 0.8 mg/dL (ref 0.44–1.00)

## 2018-06-18 LAB — COMPREHENSIVE METABOLIC PANEL
ALT: 17 U/L (ref 0–35)
AST: 23 U/L (ref 0–37)
Albumin: 4 g/dL (ref 3.5–5.2)
Alkaline Phosphatase: 77 U/L (ref 39–117)
BUN: 10 mg/dL (ref 6–23)
CO2: 25 mEq/L (ref 19–32)
Calcium: 9.6 mg/dL (ref 8.4–10.5)
Chloride: 100 mEq/L (ref 96–112)
Creatinine, Ser: 0.83 mg/dL (ref 0.40–1.20)
GFR: 67.77 mL/min (ref >60.00)
Glucose, Bld: 82 mg/dL (ref 70–99)
Potassium: 4.2 mEq/L (ref 3.5–5.1)
Sodium: 136 mEq/L (ref 135–145)
Total Bilirubin: 0.7 mg/dL (ref 0.2–1.2)
Total Protein: 6.3 g/dL (ref 6.0–8.3)

## 2018-06-18 MED ORDER — IOPAMIDOL (ISOVUE-370) INJECTION 76%
100.0000 mL | Freq: Once | INTRAVENOUS | Status: AC | PRN
  Administered 2018-06-18: 100 mL via INTRAVENOUS

## 2018-06-18 NOTE — Progress Notes (Signed)
OFFICE VISIT  06/18/2018   CC:  Chief Complaint  Patient presents with  . COPD    ? Chest feels thight and short of breath  . Edema    face     HPI:    Patient is a 73 y.o. Caucasian female who presents accompanied by her daughter today for ongoing respiratory complaints. About 5 wks ago was onset of an exacerbation of her COPD.   Showed moderate improvement at a 1 wk f/u visit (05/21/18) after being on prednisone.  At that visit I extended her steroid taper.  No antibiotics have been prescribed for this illness.  Interim hx:  Still feeling  A lot of DOE.  No fever. Her cough and chest rattle/wheezing are much improved. Can rest a few minutes after activity and collects herself and feels better but still is a bit SOB at rest (he daughter states this SOB at rest is more than the patient will admit to it).  No ST.  She did have some pain in R shoulder/scap region for several days that she thought was maybe coming from her coughing and wheezing.  Face has been swollen a few weeks now.  Finished steroids a few weeks ago.  No swelling lower extremities. No n/v/d/rash.   No throat itching or swelling.  No eye drainage.   Albut not helping with DOE but helps with wheeze/rattle. No alb neb tx since 2 d/a.  ROS: no C, no dizziness, no HAs, no rashes, no melena/hematochezia.  No polyuria or polydipsia.  No myalgias. No focal weakness but she is mildly fatigued.  +anxious and down feeling b/c she is worried about this possibly being a sign of her lung cancer recurring.  No wt loss.   Past Medical History:  Diagnosis Date  . Chronic renal insufficiency, stage 2 (mild)    GFR 60s  . COPD (chronic obstructive pulmonary disease) ([place])   . Hyperlipidemia    Mild--TLC  . Personal history of lung cancer    2008; upper lobe R lung (Dr. Earlie Server).  Got chemo.  Has been on observation and monitoring with annual CT's of chest since 2009.  As of 02/2016 she has been "released" by oncology.     Past  Surgical History:  Procedure Laterality Date  . MEDIASTINOSCOPY  2008   for bx of lung mass (Dr. Arlyce Dice)  . TUBAL LIGATION      Outpatient Medications Prior to Visit  Medication Sig Dispense Refill  . albuterol (ACCUNEB) 1.25 MG/3ML nebulizer solution Take 3 mLs (1.25 mg total) by nebulization every 6 (six) hours as needed. 75 mL 6  . albuterol (VENTOLIN HFA) 108 (90 Base) MCG/ACT inhaler Inhale 1-2 puffs into the lungs every 4 (four) hours as needed for wheezing or shortness of breath. 1 Inhaler 1  . budesonide-formoterol (SYMBICORT) 160-4.5 MCG/ACT inhaler Inhale 2 puffs into the lungs 2 (two) times daily. 3 Inhaler 3  . clonazePAM (KLONOPIN) 0.5 MG tablet 1-2 tabs po qhs prn insomnia and worry 60 tablet 3  . ipratropium (ATROVENT) 0.03 % nasal spray Place 2 sprays into both nostrils every 12 (twelve) hours. 30 mL 5  . predniSONE (DELTASONE) 10 MG tablet 1 tab po qd x 5d (Patient not taking: Reported on 06/18/2018) 5 tablet 0  . predniSONE (DELTASONE) 20 MG tablet Take 2 tab daily for 5 days then 1 tab daily for 5 days (Patient not taking: Reported on 06/18/2018) 15 tablet 0   No facility-administered medications prior to visit.  No Known Allergies  ROS As per HPI  PE: Blood pressure (!) 143/84, pulse 100, temperature 97.7 F (36.5 C), temperature source Oral, resp. rate 16, height 5' 4.5" (1.638 m), weight 117 lb 2 oz (53.1 kg), SpO2 99 %. Gen: Alert, well appearing.  Patient is oriented to person, place, time, and situation. AFFECT: pleasant, lucid thought and speech.  Occ tearful, though. ENT: Ears: EACs clear, normal epithelium.  TMs with good light reflex and landmarks bilaterally.  Eyes: no injection, icteris, swelling, or exudate.  EOMI, PERRLA. Nose: no drainage or turbinate edema/swelling.  No injection or focal lesion.  Mouth: lips without lesion/swelling.  Oral mucosa pink and moist, tongue is normal.  Dentition intact and without obvious caries or gingival swelling.   Oropharynx without erythema, exudate, or swelling.  She does have a moderate amount of generalized facial swelling w/out rash/erythema. CV: Regular, tachy to 110-115, no m/r/g. Lungs: trace diffuse insp/exp wheezing, with decent aeration, mildly prolonged exp phase.  She did forced exp maneuver pretty well and no change in breath sounds.  No crackles.  Breathing is nonlabored.  RR low 20s/min or so. Abd: soft, NT/ND EXT: no clubbing or cyanosis.  no edema.    LABS:    Chemistry      Component Value Date/Time   NA 138 02/28/2018 1007   NA 140 02/16/2016 0955   K 4.3 02/28/2018 1007   K 4.4 02/16/2016 0955   CL 101 02/28/2018 1007   CL 105 01/22/2012 0830   CO2 27 02/28/2018 1007   CO2 24 02/16/2016 0955   BUN 8 02/28/2018 1007   BUN 10.0 02/16/2016 0955   CREATININE 0.80 06/18/2018 1008   CREATININE 0.8 02/16/2016 0955      Component Value Date/Time   CALCIUM 9.7 02/28/2018 1007   CALCIUM 9.5 02/16/2016 0955   ALKPHOS 67 02/15/2017 0952   ALKPHOS 70 02/16/2016 0955   AST 22 02/15/2017 0952   AST 36 (H) 02/16/2016 0955   ALT 20 02/15/2017 0952   ALT 30 02/16/2016 0955   BILITOT 0.7 02/15/2017 0952   BILITOT 0.64 02/16/2016 0955     Lab Results  Component Value Date   WBC 4.1 08/17/2016   HGB 14.7 08/17/2016   HCT 44.0 08/17/2016   MCV 97.9 08/17/2016   PLT 174.0 08/17/2016    IMPRESSION AND PLAN:   1) DOE in the setting of a slow- to- resolve COPD exacerbation.  Her DOE is out of proportion to what is expected at this point in her illness.   She has remote hx of lung ca-->with facial swelling and her DOE I am worried about recurrence of lung ca causing partial superior vena cava obstruction.  Also, given worry about lung ca recurrence in the setting on DOE, I do feel like PE needs to be ruled out.  Best case scenario is that imaging shows an infiltrate c/w pneumonia. CT angio chest -->[facility] ASAP. CBC, BMET today. No new meds for now.  Spent 40 min with pt today,  with >50% of this time spent in counseling and care coordination regarding the above problems.  An After Visit Summary was printed and given to the patient.  FOLLOW UP: Return for to be determined based on results of w/u.  Signed:  Crissie Sickles, MD           06/18/2018

## 2018-06-18 NOTE — Telephone Encounter (Signed)
Call report received from [facility]. Report called to [facility] at the [place] office.

## 2018-06-19 ENCOUNTER — Encounter: Payer: Self-pay | Admitting: Internal Medicine

## 2018-06-19 ENCOUNTER — Telehealth: Payer: Self-pay | Admitting: Internal Medicine

## 2018-06-19 NOTE — Telephone Encounter (Signed)
A new patient appt has been scheduled for the pt to see Dr. Julien Nordmann on 2/19 at 2pm w/labs at 130pm. Pt aware of the appt date and time. Letter mailed to the pt.

## 2018-06-24 ENCOUNTER — Telehealth: Payer: Self-pay | Admitting: *Deleted

## 2018-06-24 ENCOUNTER — Encounter: Payer: Self-pay | Admitting: *Deleted

## 2018-06-24 DIAGNOSIS — R911 Solitary pulmonary nodule: Secondary | ICD-10-CM

## 2018-06-24 DIAGNOSIS — R918 Other nonspecific abnormal finding of lung field: Secondary | ICD-10-CM

## 2018-06-24 DIAGNOSIS — C3411 Malignant neoplasm of upper lobe, right bronchus or lung: Secondary | ICD-10-CM

## 2018-06-24 NOTE — Telephone Encounter (Signed)
OK. I just ordered the PET scan. As long as swelling is just a little worse, then that is ok.  Nothing new to do at this time.-thx

## 2018-06-24 NOTE — Progress Notes (Signed)
Oncology Nurse Navigator Documentation  Oncology Nurse Navigator Flowsheets 06/24/2018  Navigator Location [facility]  Navigator Encounter Type Telephone/I received a notice that patient is having pain and swelling in neck and face.  I spoke with Dr. Julien Nordmann.  He updated me to make referral to XRT.  I did and called daughter with an update.  She was thankful for the call.   Telephone Outgoing Call  Abnormal Finding Date 06/18/2018  Treatment Phase Abnormal Scans  Barriers/Navigation Needs Education;Coordination of Care  Education Other  Interventions Coordination of Care;Education  Coordination of Care Other  Education Method Verbal  Acuity Level 2  Time Spent with Patient 30

## 2018-06-24 NOTE — Telephone Encounter (Signed)
SW pts daughter, okay per DPR.   I advised her that I did not see where a PET scan was ordered or mentioned but that pt does have an apt with oncology on 07/03/18. She voiced understanding and stated that it was mentioned during her phone call with Dr. Anitra Lauth last week. She also wanted to make Dr. Anitra Lauth aware that the swelling in pts face and neck is a little worse than it was at her last apt.   I advised pts daughter that Dr. Anitra Lauth was out of the office today and that we would call her back as soon as he responds back. She voiced understanding.

## 2018-06-24 NOTE — Telephone Encounter (Signed)
Patient's daughter aware of PET scan order.  She also wanted Dr Anitra Lauth to know that Dr Inda Merlin put in a referral for radiation oncology.

## 2018-06-24 NOTE — Telephone Encounter (Signed)
Copied from [place] [id number]. Topic: General - Other >> Jun 24, 2018  8:55 AM Carolyn Stare wrote:  Pt daughter [name] call to say that Dr Anitra Lauth had mention pt having a PET scan and  she is asking if this is going to be scheduled . Daughter said the reason being is that pt is having more  swelling

## 2018-06-25 NOTE — Telephone Encounter (Signed)
Noted  

## 2018-06-25 NOTE — Progress Notes (Signed)
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 Mass shares a broad surface with pleura. There is thickening at the costochondral junction of the anterior LEFT second rib which could indicate extension into the chest wall (image 25/7). Potential involvement of the medial LEFT first rib.  Patient presented with symptoms of: Patient is a 73 y.o. Caucasian female who presents accompanied by her daughter today for ongoing respiratory complaints. About 5 wks ago was onset of an exacerbation of her COPD.   Showed moderate improvement at a 1 wk f/u visit (05/21/18) after being on prednisone.  At that visit I extended her steroid taper.  No antibiotics have been prescribed for this illness.  Biopsies revealed: None performed  Tobacco/Marijuana/Snuff/ETOH use: Tobacco use:  quit    Year quit:  2004    Pack-years:  40 yrs 1 pk a day  Past/Anticipated interventions by cardiothoracic surgery, if any: None at this time  Past/Anticipated interventions by medical oncology, if any: Initial consult with Dr. Julien Nordmann 07/03/18  Signs/Symptoms Weight changes, if any: No  Respiratory complaints, if any: congestion, tightness of chest, facial swelling, pain in neck  Hemoptysis, if any: pt with cough, sputum "will not come up and rattling", denies hemoptysis  Pain issues, if any: Right shoulder, pt states pain varies. Pt applied ice this am and relieved pain from 5/10 to 0/10. Pt takes Ibuprofen 600mg  BID for pain.  Interim hx:  Still feeling  A lot of DOE.  No fever. Her cough and chest rattle/wheezing are much improved. Can rest a few minutes after activity and collects herself and feels better but still is a bit SOB at rest (he daughter states this SOB at rest is more than the patient will admit to it).  No ST.  She did have some pain in R shoulder/scap region for several days that she  thought was maybe coming from her coughing and wheezing.  Face has been swollen a few weeks now.  Finished steroids a few weeks ago.  No swelling lower extremities. No n/v/d/rash.   No throat itching or swelling.  No eye drainage.   Albut not helping with DOE but helps with wheeze/rattle. No alb neb tx since 2 d/a.  SAFETY ISSUES:  Prior radiation? RIGHT lung, 2008 by Dr. Tammi Klippel  Pacemaker/ICD? No   Possible current pregnancy? No  Is the patient on methotrexate? No  Current Complaints / other details:  Pt presents today for initial consult with Dr. Sondra Come for Radiation Oncology for LEFT lung. Pt had previous RIGHT lung radiation by Dr. Tammi Klippel in 2008. Pt is accompanied by daughter, [name].  BP 132/64   Pulse 90   Temp 97.8 F (36.6 C) (Oral)   Resp 20   Ht 5\' 6"  (1.676 m)   Wt 117 lb 12.8 oz (53.4 kg)   SpO2 99%   BMI 19.01 kg/m   Wt Readings from Last 3 Encounters:  06/26/18 117 lb 12.8 oz (53.4 kg)  06/26/18 117 lb 12.8 oz (53.4 kg)  06/18/18 117 lb 2 oz (53.1 kg)   Loma Sousa, RN BSN

## 2018-06-26 ENCOUNTER — Other Ambulatory Visit: Payer: Self-pay

## 2018-06-26 ENCOUNTER — Encounter: Payer: Self-pay | Admitting: Radiation Oncology

## 2018-06-26 ENCOUNTER — Ambulatory Visit
Admission: RE | Admit: 2018-06-26 | Discharge: 2018-06-26 | Disposition: A | Discharge status: Home / Self Care | Payer: Medicare Other | Source: Ambulatory Visit | Attending: Radiation Oncology | Admitting: Radiation Oncology

## 2018-06-26 VITALS — BP 132/64 | HR 90 | Temp 97.8°F | Resp 20 | Ht 66.0 in | Wt 117.8 lb

## 2018-06-26 DIAGNOSIS — Z923 Personal history of irradiation: Secondary | ICD-10-CM | POA: Insufficient documentation

## 2018-06-26 DIAGNOSIS — J449 Chronic obstructive pulmonary disease, unspecified: Secondary | ICD-10-CM | POA: Insufficient documentation

## 2018-06-26 DIAGNOSIS — Z87891 Personal history of nicotine dependence: Secondary | ICD-10-CM | POA: Diagnosis not present

## 2018-06-26 DIAGNOSIS — C3411 Malignant neoplasm of upper lobe, right bronchus or lung: Secondary | ICD-10-CM

## 2018-06-26 DIAGNOSIS — C3490 Malignant neoplasm of unspecified part of unspecified bronchus or lung: Secondary | ICD-10-CM | POA: Insufficient documentation

## 2018-06-26 DIAGNOSIS — R918 Other nonspecific abnormal finding of lung field: Secondary | ICD-10-CM | POA: Diagnosis not present

## 2018-06-26 DIAGNOSIS — N182 Chronic kidney disease, stage 2 (mild): Secondary | ICD-10-CM | POA: Diagnosis not present

## 2018-06-26 DIAGNOSIS — Z79899 Other long term (current) drug therapy: Secondary | ICD-10-CM | POA: Diagnosis not present

## 2018-06-26 DIAGNOSIS — E785 Hyperlipidemia, unspecified: Secondary | ICD-10-CM | POA: Insufficient documentation

## 2018-06-26 NOTE — Progress Notes (Signed)
Radiation Oncology         [phone] ________________________________  Initial Outpatient Consultation  Name: Leah Howell MRN: [id number]  Date: 06/26/2018  DOB: August 17, 1945  CC:Howell, Leah Blackwater, MD  Curt Bears, MD   REFERRING PHYSICIAN: Curt Bears, MD  DIAGNOSIS: Probably metachronous non small cell lung cancer presenting in the left upper chest region  HISTORY OF PRESENT ILLNESS::Leah Howell is a 73 y.o. female who is presenting to the office today for evaluation of her left upper lobe nodule. She is accompanied by her daughter Leah Howell. She has a hx of right lung cancer diagnosed in 2008 and was treated in the clinic by Dr. Tammi Klippel and Dr. Julien Nordmann. She returns today for evaluation of a left upper lobe mass. She had gradual breathing changes beginning in December. She saw Dr. Anitra Lauth for this who gave her prednisone with no relief. Prednisone was discontinued approximately 1 month ago.  She underwent CT angio with contrast on February 4. Images from this scan are pasted below. The scan showed a lobular mass in the LEFT upper lobe (measuring 3 cm x 2.7 cm) most consistent with bronchogenic carcinoma. Concern for extension into the chest wall at the level of the first and second rib medially. No evidence of metastatic mediastinal adenopathy. Postsurgical change in the RIGHT lung without evidence of local recurrence.  she reports constant  shortness of breath, relieved when sitting down or resting. She is experiencing facial and neck swelling, initially attributed to the prednisone. Her chest becomes tight when she performs ADLs. Her right shoulder joint hurts as well, for which she takes OTC medication. She reports sleeping on 2. pillows. she denies headaches, chest pain and any other symptoms.     PREVIOUS RADIATION THERAPY: Yes, 2008 with Dr. Tammi Klippel, right upper lung  PAST MEDICAL HISTORY:  has a past medical history of Chronic renal insufficiency, stage 2 (mild),  COPD (chronic obstructive pulmonary disease) ([place]), Hyperlipidemia, and Personal history of lung cancer.    PAST SURGICAL HISTORY: Past Surgical History:  Procedure Laterality Date  . MEDIASTINOSCOPY  2008   for bx of lung mass (Dr. Arlyce Dice)  . TUBAL LIGATION      FAMILY HISTORY: family history includes Alcohol abuse in her father and mother; Heart disease in her mother; Liver disease in her brother.  SOCIAL HISTORY:  reports that she quit smoking about 16 years ago. Her smoking use included cigarettes. She has never used smokeless tobacco. She reports that she does not drink alcohol or use drugs.  ALLERGIES: Patient has no known allergies.  MEDICATIONS:  Current Outpatient Medications  Medication Sig Dispense Refill  . albuterol (ACCUNEB) 1.25 MG/3ML nebulizer solution Take 3 mLs (1.25 mg total) by nebulization every 6 (six) hours as needed. 75 mL 6  . albuterol (VENTOLIN HFA) 108 (90 Base) MCG/ACT inhaler Inhale 1-2 puffs into the lungs every 4 (four) hours as needed for wheezing or shortness of breath. 1 Inhaler 1  . budesonide-formoterol (SYMBICORT) 160-4.5 MCG/ACT inhaler Inhale 2 puffs into the lungs 2 (two) times daily. 3 Inhaler 3  . clonazePAM (KLONOPIN) 0.5 MG tablet 1-2 tabs po qhs prn insomnia and worry 60 tablet 3  . ibuprofen (ADVIL,MOTRIN) 200 MG tablet Take 600 mg by mouth every 8 (eight) hours as needed.    Marland Kitchen ipratropium (ATROVENT) 0.03 % nasal spray Place 2 sprays into both nostrils every 12 (twelve) hours. 30 mL 5   No current facility-administered medications for this encounter.     REVIEW OF  SYSTEMS:  A 10+ POINT REVIEW OF SYSTEMS WAS OBTAINED including neurology, dermatology, psychiatry, cardiac, respiratory, lymph, extremities, GI, GU, musculoskeletal, constitutional, reproductive, HEENT. All pertinent positives are noted in the HPI. All others are negative.    PHYSICAL EXAM:  height is 5\' 6"  (1.676 m) and weight is 117 lb 12.8 oz (53.4 kg). Her oral temperature  is 97.8 F (36.6 C). Her blood pressure is 132/64 and her pulse is 90. Her respiration is 20 and oxygen saturation is 99%.   General: Alert and oriented, in no acute distress HEENT: Head is normocephalic. Extraocular movements are intact. Oropharynx is clear. Dentures on top and bottom. Neck: Neck is supple, no palpable cervical or supraclavicular lymphadenopathy. Heart: Regular in rate and rhythm with no murmurs, rubs, or gallops. Chest: Clear to auscultation bilaterally, with no rhonchi, wheezes, or rales. Abdomen: Soft, nontender, nondistended, with no rigidity or guarding. Extremities: No cyanosis or edema. Lymphatics: see Neck Exam Skin: No concerning lesions. Musculoskeletal: symmetric strength and muscle tone throughout. Neurologic: Cranial nerves II through XII are grossly intact. No obvious focalities. Speech is fluent. Coordination is intact. Psychiatric: Judgment and insight are intact. Affect is appropriate. Swelling in her neck bilaterally. No significant venous distention. Fullness deep in the left supraclavicular fossa but no lymph nodes abnormality is palpable.    ECOG = 2  0 - Asymptomatic (Fully active, able to carry on all predisease activities without restriction)  1 - Symptomatic but completely ambulatory (Restricted in physically strenuous activity but ambulatory and able to carry out work of a light or sedentary nature. For example, light housework, office work)  2 - Symptomatic, <50% in bed during the day (Ambulatory and capable of all self care but unable to carry out any work activities. Up and about more than 50% of waking hours)  3 - Symptomatic, >50% in bed, but not bedbound (Capable of only limited self-care, confined to bed or chair 50% or more of waking hours)  4 - Bedbound (Completely disabled. Cannot carry on any self-care. Totally confined to bed or chair)  5 - Death   Eustace Pen MM, Creech RH, Tormey DC, et al. [phone]). "Toxicity and response criteria of  the [facility]". [place] Oncol. 5 (6): 649-55  LABORATORY DATA:  Lab Results  Component Value Date   WBC 4.0 06/18/2018   HGB 14.7 06/18/2018   HCT 43.6 06/18/2018   MCV 103.1 (H) 06/18/2018   PLT 220.0 06/18/2018   NEUTROABS 2.8 06/18/2018   Lab Results  Component Value Date   NA 136 06/18/2018   K 4.2 06/18/2018   CL 100 06/18/2018   CO2 25 06/18/2018   GLUCOSE 82 06/18/2018   CREATININE 0.80 06/18/2018   CALCIUM 9.6 06/18/2018      RADIOGRAPHY: Ct Angio Chest Pe W Or Wo Contrast  Result Date: 06/18/2018 CLINICAL DATA:  Respiratory distress. COPD exacerbation. History of RIGHT lung cancer diagnosed 2008. EXAM: CT ANGIOGRAPHY CHEST WITH CONTRAST TECHNIQUE: Multidetector CT imaging of the chest was performed using the standard protocol during bolus administration of intravenous contrast. Multiplanar CT image reconstructions and MIPs were obtained to evaluate the vascular anatomy. CONTRAST:  168mL ISOVUE-370 IOPAMIDOL (ISOVUE-370) INJECTION 76% COMPARISON:  Chest CT 02/28/2016 FINDINGS: Cardiovascular: No filling defects within the pulmonary arteries to suggest acute pulmonary embolism. No acute findings of the aorta or great vessels. No pericardial fluid. Coronary artery calcification and aortic atherosclerotic calcification. Mediastinum/Nodes: No axillary supraclavicular adenopathy. Mediastinal hilar adenopathy. Lungs/Pleura: Postsurgical change consistent with RIGHT  upper lobectomy. Pleural thickening at the RIGHT lung apex is unchanged. No nodularity in the RIGHT lung. A LEFT upper lobe rounded mass extends to the anterior pleural surface measuring 3 cm x 2.7 cm (image 41/9). Mass shares a broad surface with pleura. There is thickening at the costochondral junction of the anterior LEFT second rib which could indicate extension into the chest wall (image 25/7). Potential involvement of the medial LEFT first rib. Upper Abdomen: Limited view of the liver,  kidneys, pancreas are unremarkable. Normal adrenal glands. Several low-density lesions in the dome liver and to lesser degree the LEFT hepatic lobe are not changed. Adrenal glands partially imaged appear normal. Musculoskeletal: No aggressive osseous lesion Review of the MIP images confirms the above findings. IMPRESSION: 1. Lobular mass in the LEFT upper lobe is most consistent with bronchogenic carcinoma. Concern for extension into the chest wall at the level of the first and second rib medially. FDG PET scan could aid in evaluation of chest wall invasion. 2. No evidence of metastatic mediastinal adenopathy. 3. Postsurgical change in the RIGHT lung without evidence of local recurrence. These results will be called to the ordering clinician or representative by the Radiologist Assistant, and communication documented in the PACS or zVision Dashboard. Electronically Signed   By: Suzy Bouchard M.D.   On: 06/18/2018 10:33      IMPRESSION: Probably metachronous non-small cell lung cancer presenting in the left upper chest region.  Pt has not had biopsy of this area. She has been scheduled for a PET scan for further evaluation. Depending on results from the PET scan, pt would likely be a good candidate for radiation therapy as a component of her treatment. If on PET scan imaging this is the only site of disease, may be worth considering pre-operative radiation therapy, followed by resection. Although, not sure the pt in (light of her performance status) could tolerate surgery. Also need to think about performing an MRI of the brain to complete her staging workup. I am unsure of the etiology of pt's neck and facial swelling since it has been a month since she completed steroids. She does not have difficulties lying flat (in terms of her breathing).   PLAN: She will see Dr. Julien Nordmann on February 19. She will be scheduled for PET scan, brain MRI, and biopsy.    ------------------------------------------------  Blair Promise, PhD, MD  This document serves as a record of services personally performed by Gery Pray, MD. It was created on his behalf by Mary-Margaret Loma Messing, a trained medical scribe. The creation of this record is based on the scribe's personal observations and the provider's statements to them. This document has been checked and approved by the attending provider.

## 2018-06-28 ENCOUNTER — Encounter: Payer: Self-pay | Admitting: General Practice

## 2018-06-28 NOTE — Progress Notes (Signed)
Leah Howell Psychosocial Distress Screening Clinical Social Work  Clinical Social Work was referred by distress screening protocol.  The patient scored a 6 on the Psychosocial Distress Thermometer which indicates moderate distress. Clinical Social Worker contacted daughter, Leah Howell at patient request, to assess for distress and other psychosocial needs. Unable to reach daughter or leave VM at number provided.  Please reconsult CSW if help/resources are needed.    ONCBCN DISTRESS SCREENING 06/26/2018  Screening Type Initial Screening  Distress experienced in past week (1-10) 6  Practical problem type Insurance  Emotional problem type Nervousness/Anxiety;Adjusting to illness;Adjusting to appearance changes  Information Concerns Type Lack of info about diagnosis;Lack of info about treatment  Physical Problem type Pain;Breathing;Other (comment)  Other per pt, please call daughter Leah Howell at [phone]. Thanks.     Clinical Social Worker follow up needed: No.  Await recontact by patient or family.  If yes, follow up plan:  Beverely Pace, [place], LCSW Clinical Social Worker Phone:  [phone]

## 2018-07-02 ENCOUNTER — Other Ambulatory Visit: Payer: Self-pay | Admitting: Family Medicine

## 2018-07-02 ENCOUNTER — Other Ambulatory Visit: Payer: Self-pay | Admitting: Medical Oncology

## 2018-07-02 DIAGNOSIS — C3411 Malignant neoplasm of upper lobe, right bronchus or lung: Secondary | ICD-10-CM

## 2018-07-02 DIAGNOSIS — R911 Solitary pulmonary nodule: Secondary | ICD-10-CM

## 2018-07-02 DIAGNOSIS — R918 Other nonspecific abnormal finding of lung field: Secondary | ICD-10-CM

## 2018-07-03 ENCOUNTER — Inpatient Hospital Stay: Payer: Medicare Other | Attending: Internal Medicine | Admitting: Internal Medicine

## 2018-07-03 ENCOUNTER — Encounter: Payer: Self-pay | Admitting: Internal Medicine

## 2018-07-03 ENCOUNTER — Inpatient Hospital Stay: Payer: Medicare Other

## 2018-07-03 ENCOUNTER — Telehealth: Payer: Self-pay | Admitting: Internal Medicine

## 2018-07-03 ENCOUNTER — Other Ambulatory Visit: Payer: Self-pay | Admitting: Radiology

## 2018-07-03 VITALS — BP 167/88 | HR 92 | Temp 97.7°F | Resp 20 | Ht 66.0 in | Wt 118.0 lb

## 2018-07-03 DIAGNOSIS — C3411 Malignant neoplasm of upper lobe, right bronchus or lung: Secondary | ICD-10-CM

## 2018-07-03 DIAGNOSIS — Z9221 Personal history of antineoplastic chemotherapy: Secondary | ICD-10-CM | POA: Diagnosis not present

## 2018-07-03 DIAGNOSIS — Z791 Long term (current) use of non-steroidal anti-inflammatories (NSAID): Secondary | ICD-10-CM | POA: Insufficient documentation

## 2018-07-03 DIAGNOSIS — J441 Chronic obstructive pulmonary disease with (acute) exacerbation: Secondary | ICD-10-CM | POA: Insufficient documentation

## 2018-07-03 DIAGNOSIS — C3412 Malignant neoplasm of upper lobe, left bronchus or lung: Secondary | ICD-10-CM | POA: Insufficient documentation

## 2018-07-03 DIAGNOSIS — Z923 Personal history of irradiation: Secondary | ICD-10-CM | POA: Insufficient documentation

## 2018-07-03 DIAGNOSIS — I10 Essential (primary) hypertension: Secondary | ICD-10-CM | POA: Diagnosis not present

## 2018-07-03 DIAGNOSIS — N182 Chronic kidney disease, stage 2 (mild): Secondary | ICD-10-CM | POA: Diagnosis not present

## 2018-07-03 DIAGNOSIS — Z79899 Other long term (current) drug therapy: Secondary | ICD-10-CM

## 2018-07-03 LAB — CBC WITH DIFFERENTIAL (CANCER CENTER ONLY)
Abs Immature Granulocytes: 0.01 10*3/uL (ref 0.00–0.07)
BASOS ABS: 0 10*3/uL (ref 0.0–0.1)
Basophils Relative: 0 %
Eosinophils Absolute: 0.2 10*3/uL (ref 0.0–0.5)
Eosinophils Relative: 5 %
HCT: 41.4 % (ref 36.0–46.0)
Hemoglobin: 14.1 g/dL (ref 12.0–15.0)
Immature Granulocytes: 0 %
Lymphocytes Relative: 13 %
Lymphs Abs: 0.6 10*3/uL — ABNORMAL LOW (ref 0.7–4.0)
MCH: 34.4 pg — ABNORMAL HIGH (ref 26.0–34.0)
MCHC: 34.1 g/dL (ref 30.0–36.0)
MCV: 101 fL — ABNORMAL HIGH (ref 80.0–100.0)
Monocytes Absolute: 0.4 10*3/uL (ref 0.1–1.0)
Monocytes Relative: 10 %
NRBC: 0 % (ref 0.0–0.2)
Neutro Abs: 3.2 10*3/uL (ref 1.7–7.7)
Neutrophils Relative %: 72 %
Platelet Count: 173 10*3/uL (ref 150–400)
RBC: 4.1 MIL/uL (ref 3.87–5.11)
RDW: 13 % (ref 11.5–15.5)
WBC Count: 4.5 10*3/uL (ref 4.0–10.5)

## 2018-07-03 LAB — CMP (CANCER CENTER ONLY)
ALT: 15 U/L (ref 0–44)
AST: 26 U/L (ref 15–41)
Albumin: 4 g/dL (ref 3.5–5.0)
Alkaline Phosphatase: 86 U/L (ref 38–126)
Anion gap: 10 (ref 5–15)
BUN: 9 mg/dL (ref 8–23)
CO2: 25 mmol/L (ref 22–32)
Calcium: 9.4 mg/dL (ref 8.9–10.3)
Chloride: 98 mmol/L (ref 98–111)
Creatinine: 0.8 mg/dL (ref 0.44–1.00)
GFR, Estimated: >60 mL/min (ref >60)
Glucose, Bld: 91 mg/dL (ref 70–99)
Potassium: 4.3 mmol/L (ref 3.5–5.1)
Sodium: 133 mmol/L — ABNORMAL LOW (ref 135–145)
Total Bilirubin: 0.6 mg/dL (ref 0.3–1.2)
Total Protein: 6.8 g/dL (ref 6.5–8.1)

## 2018-07-03 NOTE — Telephone Encounter (Signed)
Scheduled appt per 02/19 los.  Printed calendar and avs.

## 2018-07-03 NOTE — Progress Notes (Signed)
[facility] Telephone:([phone]   [phone]) [phone]  OFFICE PROGRESS NOTE  Tammi Sou, MD [address] [place] [address]  PRINCIPAL DIAGNOSIS:  1) Likely recurrent lung cancer presented with left upper lobe lung mass with questionable chest wall invasion, pending tissue diagnosis and staging work-up. 2)  Stage IIIA (TX, N2, MX) non-small cell lung cancer, squamous cell carcinoma diagnosed in October 2008.   PRIOR THERAPY:  1. Status post concurrent chemoradiation with weekly carboplatin and paclitaxel. Last dose was given April 05, 2007. 2. Status post 3 cycles of consolidation chemotherapy with docetaxel, last dose was given July 15, 2007.  CURRENT THERAPY: Observation.  INTERVAL HISTORY: Leah Howell 73 y.o. female returns to the clinic today for follow-up visit accompanied by her daughter and sister.  The patient was last seen in 2017.  She was initially diagnosed with stage IIIa non-small cell lung cancer in October 2008 and completed her course of concurrent chemoradiation followed by consolidation chemotherapy in 2009 and has been on observation since that time.  The patient was feeling fine until recently when she started having chest congestion as well as swelling of the face.  She was seen by her primary care physician and started initially on a course of prednisone for 10 days with no improvement.  This was extended for 5 more days and again no significant improvement.  The patient had CT angiogram of the chest performed on 06/18/2018 and that showed no evidence for pulmonary embolism but there was a left upper lobe rounded mass extending to the anterior pleural surface measuring 3.0 x 2.7 cm.  The mass shares broad service with pleura.  There was thickening at the costochondral junction of the anterior left second rib which could indicate extension into the chest wall.  There was also potential involvement of the medial left first  rib. The patient was referred to me today for evaluation and recommendation regarding her condition.  She was also seen few days before by Dr. Sondra Come who ordered CT-guided core biopsy of the left upper lobe lung mass and this is scheduled to be done tomorrow.  She is also scheduled to have a PET scan on 07/11/2018.  She also had MRI of the brain ordered but not scheduled yet. When seen today she continues to complain of swelling of the face as well as shortness of breath with exertion and cough.  She denied having any recent weight loss or night sweats.  She has no nausea, vomiting, diarrhea or constipation.  She has no headache or visual changes.  MEDICAL HISTORY: Past Medical History:  Diagnosis Date  . Chronic renal insufficiency, stage 2 (mild)    GFR 60s  . COPD (chronic obstructive pulmonary disease) ([place])   . Hyperlipidemia    Mild--[facility]  . Personal history of lung cancer    2008; upper lobe R lung (Dr. Earlie Server).  Got chemo.  Has been on observation and monitoring with annual CT's of chest since 2009.  As of 02/2016 she has been "released" by oncology.     ALLERGIES:  has No Known Allergies.  MEDICATIONS:  Current Outpatient Medications  Medication Sig Dispense Refill  . albuterol (ACCUNEB) 1.25 MG/3ML nebulizer solution Take 3 mLs (1.25 mg total) by nebulization every 6 (six) hours as needed. 75 mL 6  . albuterol (VENTOLIN HFA) 108 (90 Base) MCG/ACT inhaler Inhale 1-2 puffs into the lungs every 4 (four) hours as needed for wheezing or  shortness of breath. 1 Inhaler 1  . budesonide-formoterol (SYMBICORT) 160-4.5 MCG/ACT inhaler Inhale 2 puffs into the lungs 2 (two) times daily. 3 Inhaler 3  . clonazePAM (KLONOPIN) 0.5 MG tablet 1-2 tabs po qhs prn insomnia and worry (Patient taking differently: Take 0.5-1 mg by mouth at bedtime as needed for anxiety. 1-2 tabs po qhs prn insomnia and worry) 60 tablet 3  . ibuprofen (ADVIL,MOTRIN) 200 MG tablet Take 600 mg by mouth every 8 (eight) hours  as needed.    Marland Kitchen ipratropium (ATROVENT) 0.03 % nasal spray Place 2 sprays into both nostrils every 12 (twelve) hours. (Patient taking differently: Place 2 sprays into both nostrils 2 (two) times daily as needed for rhinitis. ) 30 mL 5   No current facility-administered medications for this visit.     SURGICAL HISTORY:  Past Surgical History:  Procedure Laterality Date  . MEDIASTINOSCOPY  2008   for bx of lung mass (Dr. Arlyce Dice)  . TUBAL LIGATION      REVIEW OF SYSTEMS:  Constitutional: positive for fatigue Eyes: negative Ears, nose, mouth, throat, and face: negative Respiratory: positive for cough and dyspnea on exertion Cardiovascular: negative Gastrointestinal: negative Genitourinary:negative Integument/breast: negative Hematologic/lymphatic: negative Musculoskeletal:negative Neurological: negative Behavioral/Psych: negative Endocrine: negative Allergic/Immunologic: negative   PHYSICAL EXAMINATION: General appearance: alert, cooperative, fatigued and no distress Head: Normocephalic, without obvious abnormality, atraumatic Neck: no adenopathy Lymph nodes: Cervical, supraclavicular, and axillary nodes normal. Resp: clear to auscultation bilaterally Back: symmetric, no curvature. ROM normal. No CVA tenderness. Cardio: regular rate and rhythm, S1, S2 normal, no murmur, click, rub or gallop GI: soft, non-tender; bowel sounds normal; no masses,  no organomegaly Extremities: extremities normal, atraumatic, no cyanosis or edema Neurologic: Alert and oriented X 3, normal strength and tone. Normal symmetric reflexes. Normal coordination and gait  ECOG PERFORMANCE STATUS: 1 - Symptomatic but completely ambulatory  Blood pressure (!) 167/88, pulse 92, temperature 97.7 F (36.5 C), temperature source Oral, resp. rate 20, height 5\' 6"  (1.676 m), weight 118 lb (53.5 kg), SpO2 98 %.  LABORATORY DATA: Lab Results  Component Value Date   WBC 4.5 07/03/2018   HGB 14.1 07/03/2018   HCT  41.4 07/03/2018   MCV 101.0 (H) 07/03/2018   PLT 173 07/03/2018      Chemistry      Component Value Date/Time   NA 136 06/18/2018 0842   NA 140 02/16/2016 0955   K 4.2 06/18/2018 0842   K 4.4 02/16/2016 0955   CL 100 06/18/2018 0842   CL 105 01/22/2012 0830   CO2 25 06/18/2018 0842   CO2 24 02/16/2016 0955   BUN 10 06/18/2018 0842   BUN 10.0 02/16/2016 0955   CREATININE 0.80 06/18/2018 1008   CREATININE 0.8 02/16/2016 0955      Component Value Date/Time   CALCIUM 9.6 06/18/2018 0842   CALCIUM 9.5 02/16/2016 0955   ALKPHOS 77 06/18/2018 0842   ALKPHOS 70 02/16/2016 0955   AST 23 06/18/2018 0842   AST 36 (H) 02/16/2016 0955   ALT 17 06/18/2018 0842   ALT 30 02/16/2016 0955   BILITOT 0.7 06/18/2018 0842   BILITOT 0.64 02/16/2016 0955       RADIOGRAPHIC STUDIES: Ct Angio Chest Pe W Or Wo Contrast  Result Date: 06/18/2018 CLINICAL DATA:  Respiratory distress. COPD exacerbation. History of RIGHT lung cancer diagnosed 2008. EXAM: CT ANGIOGRAPHY CHEST WITH CONTRAST TECHNIQUE: Multidetector CT imaging of the chest was performed using the standard protocol during bolus administration of intravenous contrast. Multiplanar  CT image reconstructions and MIPs were obtained to evaluate the vascular anatomy. CONTRAST:  170mL ISOVUE-370 IOPAMIDOL (ISOVUE-370) INJECTION 76% COMPARISON:  Chest CT 02/28/2016 FINDINGS: Cardiovascular: No filling defects within the pulmonary arteries to suggest acute pulmonary embolism. No acute findings of the aorta or great vessels. No pericardial fluid. Coronary artery calcification and aortic atherosclerotic calcification. Mediastinum/Nodes: No axillary supraclavicular adenopathy. Mediastinal hilar adenopathy. Lungs/Pleura: Postsurgical change consistent with RIGHT upper lobectomy. Pleural thickening at the RIGHT lung apex is unchanged. No nodularity in the RIGHT lung. A LEFT upper lobe rounded mass extends to the anterior pleural surface measuring 3 cm x 2.7 cm  (image 41/9). Mass shares a broad surface with pleura. There is thickening at the costochondral junction of the anterior LEFT second rib which could indicate extension into the chest wall (image 25/7). Potential involvement of the medial LEFT first rib. Upper Abdomen: Limited view of the liver, kidneys, pancreas are unremarkable. Normal adrenal glands. Several low-density lesions in the dome liver and to lesser degree the LEFT hepatic lobe are not changed. Adrenal glands partially imaged appear normal. Musculoskeletal: No aggressive osseous lesion Review of the MIP images confirms the above findings. IMPRESSION: 1. Lobular mass in the LEFT upper lobe is most consistent with bronchogenic carcinoma. Concern for extension into the chest wall at the level of the first and second rib medially. FDG PET scan could aid in evaluation of chest wall invasion. 2. No evidence of metastatic mediastinal adenopathy. 3. Postsurgical change in the RIGHT lung without evidence of local recurrence. These results will be called to the ordering clinician or representative by the Radiologist Assistant, and communication documented in the PACS or zVision Dashboard. Electronically Signed   By: Suzy Bouchard M.D.   On: 06/18/2018 10:33   ASSESSMENT AND PLAN: This is a very pleasant 73 years old white female with highly suspicious lung cancer involving the left upper lobe.  The patient has a history of stage IIIa non-small cell lung cancer status post concurrent chemoradiation followed by consolidation chemotherapy and has been observation since March of 2009 with no evidence for disease recurrence.  Her most recent imaging studies including CT angiogram of the chest showed development of left upper lobe lung mass with questionable chest wall invasion. I personally and independently reviewed the scan images and discussed the result and showed the images to the patient and her family. I recommended for the patient to proceed with the  CT-guided core biopsy tomorrow as scheduled. She will also have a PET scan and MRI of the brain in the next 10 days. I will arrange for the patient to come back for follow-up visit in 2 weeks for reevaluation and more detailed discussion of her treatment options based on the final imaging studies as well as the pathology report. For hypertension, the patient was strongly advised to take her blood pressure medication as prescribed and to consult with her primary care physician for adjustment of her medication if needed. She was advised to call immediately if she has any concerning symptoms in the interval. The patient voices understanding of current disease status and treatment options and is in agreement with the current care plan.  All questions were answered. The patient knows to call the clinic with any problems, questions or concerns. We can certainly see the patient much sooner if necessary. I spent 30 minutes counseling the patient face to face. The total time spent in the appointment was 40 minutes. Disclaimer: This note was dictated with voice recognition software. Similar  sounding words can inadvertently be transcribed and may be missed upon review.

## 2018-07-04 ENCOUNTER — Other Ambulatory Visit: Payer: Self-pay

## 2018-07-04 ENCOUNTER — Ambulatory Visit (HOSPITAL_COMMUNITY)
Admission: RE | Admit: 2018-07-04 | Discharge: 2018-07-04 | Disposition: A | Discharge status: Home / Self Care | Payer: Medicare Other | Source: Ambulatory Visit | Attending: Radiation Oncology | Admitting: Radiation Oncology

## 2018-07-04 DIAGNOSIS — R222 Localized swelling, mass and lump, trunk: Secondary | ICD-10-CM | POA: Insufficient documentation

## 2018-07-04 DIAGNOSIS — C7989 Secondary malignant neoplasm of other specified sites: Secondary | ICD-10-CM | POA: Diagnosis not present

## 2018-07-04 DIAGNOSIS — C761 Malignant neoplasm of thorax: Secondary | ICD-10-CM | POA: Diagnosis not present

## 2018-07-04 DIAGNOSIS — C3411 Malignant neoplasm of upper lobe, right bronchus or lung: Secondary | ICD-10-CM | POA: Diagnosis not present

## 2018-07-04 DIAGNOSIS — J948 Other specified pleural conditions: Secondary | ICD-10-CM | POA: Diagnosis not present

## 2018-07-04 HISTORY — PX: LUNG BIOPSY: SHX232

## 2018-07-04 LAB — CBC
HCT: 44.2 % (ref 36.0–46.0)
Hemoglobin: 14.8 g/dL (ref 12.0–15.0)
MCH: 33.6 pg (ref 26.0–34.0)
MCHC: 33.5 g/dL (ref 30.0–36.0)
MCV: 100.2 fL — AB (ref 80.0–100.0)
Platelets: 209 10*3/uL (ref 150–400)
RBC: 4.41 MIL/uL (ref 3.87–5.11)
RDW: 13.2 % (ref 11.5–15.5)
WBC: 5.6 10*3/uL (ref 4.0–10.5)
nRBC: 0 % (ref 0.0–0.2)

## 2018-07-04 LAB — PROTIME-INR
INR: 0.9
Prothrombin Time: 12.1 seconds (ref 11.4–15.2)

## 2018-07-04 MED ORDER — LIDOCAINE HCL 1 % IJ SOLN
INTRAMUSCULAR | Status: AC
  Filled 2018-07-04: qty 20

## 2018-07-04 MED ORDER — HYDROCODONE-ACETAMINOPHEN 5-325 MG PO TABS
1.0000 | ORAL_TABLET | ORAL | Status: DC | PRN
  Filled 2018-07-04: qty 2

## 2018-07-04 MED ORDER — FENTANYL CITRATE (PF) 100 MCG/2ML IJ SOLN
INTRAMUSCULAR | Status: AC
  Filled 2018-07-04: qty 2

## 2018-07-04 MED ORDER — FENTANYL CITRATE (PF) 100 MCG/2ML IJ SOLN
INTRAMUSCULAR | Status: AC | PRN
  Administered 2018-07-04: 25 ug via INTRAVENOUS
  Administered 2018-07-04: 50 ug via INTRAVENOUS
  Administered 2018-07-04: 25 ug via INTRAVENOUS

## 2018-07-04 MED ORDER — MIDAZOLAM HCL 2 MG/2ML IJ SOLN
INTRAMUSCULAR | Status: AC
  Filled 2018-07-04: qty 2

## 2018-07-04 MED ORDER — SODIUM CHLORIDE 0.9 % IV SOLN: INTRAVENOUS | Status: DC

## 2018-07-04 MED ORDER — MIDAZOLAM HCL 2 MG/2ML IJ SOLN
INTRAMUSCULAR | Status: AC | PRN
  Administered 2018-07-04: 0.5 mg via INTRAVENOUS
  Administered 2018-07-04: 1 mg via INTRAVENOUS
  Administered 2018-07-04: 0.5 mg via INTRAVENOUS

## 2018-07-04 NOTE — Discharge Instructions (Signed)
Lung Biopsy, Care After °This sheet gives you information about how to care for yourself after your procedure. Your health care provider may also give you more specific instructions depending on the type of biopsy you had. If you have problems or questions, contact your health care provider. °What can I expect after the procedure? °After the procedure, it is common to have: °· A cough. °· A sore throat. °· Pain where a needle, bronchoscope, or incision was used to collect a biopsy sample (biopsy site). °Follow these instructions at home: °Medicines ° °· Take over-the-counter and prescription medicines only as told by your health care provider. °· Do not drive for 24 hours if you were given a sedative. °· Do not drink alcohol while taking pain medicine. °· Do not drive or use heavy machinery while taking prescription pain medicine. °· To prevent or treat constipation while you are taking prescription pain medicine, your health care provider may recommend that you: °? Drink enough fluid to keep your urine clear or pale yellow. °? Take over-the-counter or prescription medicines. °? Eat foods that are high in fiber, such as fresh fruits and vegetables, whole grains, and beans. °? Limit foods that are high in fat and processed sugars, such as fried and sweet foods. °Activity °· If you had an incision during your procedure, avoid activities that may pull the incision site open. °· Return to your normal activities as told by your health care provider. Ask your health care provider what activities are safe for you. °If you had an open biopsy:  °· Follow instructions from your health care provider about how to take care of your incision. Make sure you: °? Wash your hands with soap and water before you change your bandage (dressing). If soap and water are not available, use hand sanitizer. °? Change your dressing as told by your health care provider. °? Leave stitches (sutures), skin glue, or adhesive strips in place. These  skin closures may need to stay in place for 2 weeks or longer. If adhesive strip edges start to loosen and curl up, you may trim the loose edges. Do not remove adhesive strips completely unless your health care provider tells you to do that. °· Check your incision area every day for signs of infection. Check for: °? Redness, swelling, or pain. °? Fluid or blood. °? Warmth. °? Pus or a bad smell. °General instructions °· It is up to you to get the results of your procedure. Ask your health care provider, or the department that is doing the procedure, when your results will be ready. °Contact a health care provider if: °· You have a fever. °· You have redness, swelling, or pain around your biopsy site. °· You have fluid or blood coming from your biopsy site. °· Your biopsy site feels warm to the touch. °· You have pus or a bad smell coming from your biopsy site. °Get help right away if: °· You cough up blood. °· You have trouble breathing. °· You have chest pain. °Summary °· After the procedure, it is common to have a sore throat and a cough. °· Return to your normal activities as told by your health care provider. Ask your health care provider what activities are safe for you. °· Take over-the-counter and prescription medicines only as told by your health care provider. °· Report any unusual symptoms to your health care provider. °This information is not intended to replace advice given to you by your health care provider. Make sure   you discuss any questions you have with your health care provider. °Document Released: 05/30/2016 Document Revised: 05/30/2016 Document Reviewed: 05/30/2016 °Elsevier Interactive Patient Education © 2019 [employer] ° °

## 2018-07-04 NOTE — Procedures (Signed)
  Procedure: CT core L pleural mass   EBL:   minimal Complications:  none immediate  See full dictation in [employer].  Dillard Cannon MD Main # [phone] Pager  [phone]

## 2018-07-04 NOTE — Consult Note (Signed)
Chief Complaint: Patient was seen in consultation today for CT guided left chest wall/lung mass biopsy.    Referring Physician(s): Steffanie Rainwater, M.  Supervising Physician: Arne Cleveland  Patient Status: [facility] - Out-pt  History of Present Illness: Leah Howell is a 73 y.o. female presenting today for CT guided left chest wall/lung mass biopsy. Patient presented to her Oncologist with complaints of chest congestion and swelling of the face. Trial of prednisone was started by her PCP for 10 days with no improvement prior to encounter at oncologist. Treatment was extended for 5 days, still with no significant improvement. CT angiogram was performed on 06/18/2018, which showed no evidence of pulmonary embolism but did show a left upper lobe rounded mass extending to anterior pleural surface that measured 3.0 x 2.7 cm. She presents today for imaging guided biopsy of above mass.   Past Medical History:  Diagnosis Date  . Chronic renal insufficiency, stage 2 (mild)    GFR 60s  . COPD (chronic obstructive pulmonary disease) ([place])   . Hyperlipidemia    Mild--TLC  . Personal history of lung cancer    2008; upper lobe R lung (Dr. Earlie Server).  Got chemo.  Has been on observation and monitoring with annual CT's of chest since 2009.  As of 02/2016 she has been "released" by oncology.     Past Surgical History:  Procedure Laterality Date  . MEDIASTINOSCOPY  2008   for bx of lung mass (Dr. Arlyce Dice)  . TUBAL LIGATION      Allergies: Patient has no known allergies.  Medications: Prior to Admission medications   Medication Sig Start Date End Date Taking? Authorizing Provider  albuterol (ACCUNEB) 1.25 MG/3ML nebulizer solution Take 3 mLs (1.25 mg total) by nebulization every 6 (six) hours as needed. 02/28/18  Yes McGowen, [name], MD  albuterol (VENTOLIN HFA) 108 (90 Base) MCG/ACT inhaler Inhale 1-2 puffs into the lungs every 4 (four) hours as needed for wheezing or shortness of  breath. 05/13/18  Yes McGowen, [name], MD  budesonide-formoterol [facility]) 160-4.5 MCG/ACT inhaler Inhale 2 puffs into the lungs 2 (two) times daily. 02/28/18  Yes McGowen, [name], MD  clonazePAM (KLONOPIN) 0.5 MG tablet 1-2 tabs po qhs prn insomnia and worry Patient taking differently: Take 0.5-1 mg by mouth at bedtime as needed for anxiety. 1-2 tabs po qhs prn insomnia and worry 03/01/18  Yes McGowen, [name], MD  ibuprofen (ADVIL,MOTRIN) 200 MG tablet Take 600 mg by mouth every 8 (eight) hours as needed.   Yes [provider]  ipratropium (ATROVENT) 0.03 % nasal spray Place 2 sprays into both nostrils every 12 (twelve) hours. Patient taking differently: Place 2 sprays into both nostrils 2 (two) times daily as needed for rhinitis.  10/11/17  Yes McGowen, [name], MD     Family History  Problem Relation Age of Onset  . Alcohol abuse Mother   . Heart disease Mother   . Alcohol abuse Father   . Liver disease Brother     Social History   Socioeconomic History  . Marital status: Married    Spouse name: Not on file  . Number of children: Not on file  . Years of education: Not on file  . Highest education level: Not on file  Occupational History  . Not on file  Social Needs  . Financial resource strain: Not on file  . Food insecurity:    Worry: Not on file    Inability: Not on file  .  Transportation needs:    Medical: Not on file    Non-medical: Not on file  Tobacco Use  . Smoking status: Former Smoker    Types: Cigarettes    Last attempt to quit: 05/15/2002    Years since quitting: 16.1  . Smokeless tobacco: Never Used  Substance and Sexual Activity  . Alcohol use: No  . Drug use: No  . Sexual activity: Yes    Birth control/protection: Post-menopausal  Lifestyle  . Physical activity:    Days per week: Not on file    Minutes per session: Not on file  . Stress: Not on file  Relationships  . Social connections:    Talks on phone: Not on file    Gets  together: Not on file    Attends religious service: Not on file    Active member of club or organization: Not on file    Attends meetings of clubs or organizations: Not on file    Relationship status: Not on file  Other Topics Concern  . Not on file  Social History Narrative   Married, one daughter.   [employer] education.  Homemaker.   Former smoker; quit 2004.   No alcohol or drugs.     Exercise: walking        Review of Systems: No fever/chills, no headache, no chest pain, no abdomina or back pain. No nausea, vomiting, or bleeding. Positive for chronic cough, dyspnea, and neck pain. Positive for facial swelling.   Vital Signs: BP (!) 151/81   Pulse (!) 103   Temp 97.7 F (36.5 C) (Oral)   Resp 16   Ht 5\' 6"  (1.676 m)   Wt 118 lb (53.5 kg)   SpO2 95%   BMI 19.05 kg/m   Physical Exam  Awake and alert, chest with coarse breath sounds bilaterally, heart was tachycardic but regular rate and rhythm. Abdomen soft and non-tender. Normal active bowel sounds. No lower extremity edema.   Imaging: Ct Angio Chest Pe W Or Wo Contrast  Result Date: 06/18/2018 CLINICAL DATA:  Respiratory distress. COPD exacerbation. History of RIGHT lung cancer diagnosed 2008. EXAM: CT ANGIOGRAPHY CHEST WITH CONTRAST TECHNIQUE: Multidetector CT imaging of the chest was performed using the standard protocol during bolus administration of intravenous contrast. Multiplanar CT image reconstructions and MIPs were obtained to evaluate the vascular anatomy. CONTRAST:  163mL ISOVUE-370 IOPAMIDOL (ISOVUE-370) INJECTION 76% COMPARISON:  Chest CT 02/28/2016 FINDINGS: Cardiovascular: No filling defects within the pulmonary arteries to suggest acute pulmonary embolism. No acute findings of the aorta or great vessels. No pericardial fluid. Coronary artery calcification and aortic atherosclerotic calcification. Mediastinum/Nodes: No axillary supraclavicular adenopathy. Mediastinal hilar adenopathy. Lungs/Pleura: Postsurgical  change consistent with RIGHT upper lobectomy. Pleural thickening at the RIGHT lung apex is unchanged. No nodularity in the RIGHT lung. A LEFT upper lobe rounded mass extends to the anterior pleural surface measuring 3 cm x 2.7 cm (image 41/9). Mass shares a broad surface with pleura. There is thickening at the costochondral junction of the anterior LEFT second rib which could indicate extension into the chest wall (image 25/7). Potential involvement of the medial LEFT first rib. Upper Abdomen: Limited view of the liver, kidneys, pancreas are unremarkable. Normal adrenal glands. Several low-density lesions in the dome liver and to lesser degree the LEFT hepatic lobe are not changed. Adrenal glands partially imaged appear normal. Musculoskeletal: No aggressive osseous lesion Review of the MIP images confirms the above findings. IMPRESSION: 1. Lobular mass in the LEFT upper lobe is most  consistent with bronchogenic carcinoma. Concern for extension into the chest wall at the level of the first and second rib medially. FDG PET scan could aid in evaluation of chest wall invasion. 2. No evidence of metastatic mediastinal adenopathy. 3. Postsurgical change in the RIGHT lung without evidence of local recurrence. These results will be called to the ordering clinician or representative by the Radiologist Assistant, and communication documented in the PACS or zVision Dashboard. Electronically Signed   By: Suzy Bouchard M.D.   On: 06/18/2018 10:33    Labs:  CBC: Recent Labs    06/18/18 0842 07/03/18 1312  WBC 4.0 4.5  HGB 14.7 14.1  HCT 43.6 41.4  PLT 220.0 173    COAGS: No results for input(s): INR, APTT in the last 8760 hours.  BMP: Recent Labs    02/28/18 1007 06/18/18 0842 06/18/18 1008 07/03/18 1312  NA 138 136  --  133*  K 4.3 4.2  --  4.3  CL 101 100  --  98  CO2 27 25  --  25  GLUCOSE 79 82  --  91  BUN 8 10  --  9  CALCIUM 9.7 9.6  --  9.4  CREATININE 0.68 0.83 0.80 0.80  GFRNONAA   --   --   --  >60  GFRAA  --   --   --  >60    LIVER FUNCTION TESTS: Recent Labs    06/18/18 0842 07/03/18 1312  BILITOT 0.7 0.6  AST 23 26  ALT 17 15  ALKPHOS 77 86  PROT 6.3 6.8  ALBUMIN 4.0 4.0    TUMOR MARKERS: No results for input(s): AFPTM, CEA, CA199, CHROMGRNA in the last 8760 hours.  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   CT guided biopsy to be performed today for left upper lobe chest wall/lung mass seen on recent imaging. Imaging studies have been reviewed by Dr. Vernard Gambles. Risks and benefits of procedure was discussed with the patient and/or patient's family including, but not limited to bleeding, infection, damage to adjacent structures, pneumothorax requiring chest tube placement or low yield requiring additional tests, as well as death.   All of the questions were answered and there is agreement to proceed.  Consent signed and in chart.  Labs pending.  Thank you for this interesting consult.  I greatly enjoyed meeting Leah Howell and look forward to participating in their care.  A copy of this report was sent to the requesting provider on this date.  Electronically Signed: D. Rowe Robert, PA-C, Linus Mako, PA-S 07/04/2018, 10:11 AM   I spent a total of 25 mintues in face to face in clinical consultation, greater than 50% of which was counseling/coordinating care for CT guided biopsy of left chest wall/lung mass

## 2018-07-05 ENCOUNTER — Encounter: Payer: Self-pay | Admitting: Family Medicine

## 2018-07-11 ENCOUNTER — Ambulatory Visit: Payer: Self-pay | Admitting: *Deleted

## 2018-07-11 ENCOUNTER — Encounter (HOSPITAL_COMMUNITY)
Admission: RE | Admit: 2018-07-11 | Discharge: 2018-07-11 | Disposition: A | Discharge status: Home / Self Care | Payer: Medicare Other | Source: Ambulatory Visit | Attending: Family Medicine | Admitting: Family Medicine

## 2018-07-11 DIAGNOSIS — R918 Other nonspecific abnormal finding of lung field: Secondary | ICD-10-CM | POA: Diagnosis not present

## 2018-07-11 DIAGNOSIS — R911 Solitary pulmonary nodule: Secondary | ICD-10-CM

## 2018-07-11 LAB — GLUCOSE, CAPILLARY: Glucose-Capillary: 101 mg/dL — ABNORMAL HIGH (ref 70–99)

## 2018-07-11 MED ORDER — FLUDEOXYGLUCOSE F - 18 (FDG) INJECTION
5.9000 | Freq: Once | INTRAVENOUS | Status: AC
  Administered 2018-07-11: 5.9 via INTRAVENOUS

## 2018-07-11 NOTE — Telephone Encounter (Signed)
Pt calling with complaints of shortness of breath with getting up and moving around. Pt stats she had a PET scan done today and was unable to take some of her medications. Pt was able to speak clearly with the triage nurse and not distress noted at the time of call.  Pt has a history of lung CA and COPD. Pt scheduled for appt on tomorrow for Dr. Anitra Lauth and advised that if symptoms worsened during the night to seek treatment in the ED. Understanding verbalized.  Reason for Disposition . [1] MILD difficulty breathing (e.g., minimal/no SOB at rest, SOB with walking, pulse <100) AND [2] NEW-onset or WORSE than normal  Answer Assessment - Initial Assessment Questions 1. RESPIRATORY STATUS: "Describe your breathing?" (e.g., wheezing, shortness of breath, unable to speak, severe coughing)      Shortness of breath with getting up and moving around 2. ONSET: "When did this breathing problem begin?"      today 3. PATTERN "Does the difficult breathing come and go, or has it been constant since it started?"   increases with activity 4. SEVERITY: "How bad is your breathing?" (e.g., mild, moderate, severe)    - MILD: No SOB at rest, mild SOB with walking, speaks normally in sentences, can lay down, no retractions, pulse < 100.    - MODERATE: SOB at rest, SOB with minimal exertion and prefers to sit, cannot lie down flat, speaks in phrases, mild retractions, audible wheezing, pulse 100-120.    - SEVERE: Very SOB at rest, speaks in single words, struggling to breathe, sitting hunched forward, retractions, pulse > 120      Mild  5. RECURRENT SYMPTOM: "Have you had difficulty breathing before?" If so, ask: "When was the last time?" and "What happened that time?"      Yes pt states that this is not a new symptoms 7. LUNG HISTORY: "Do you have any history of lung disease?"  (e.g., pulmonary embolus, asthma, emphysema)     History of lung Ca and COPD and pt has a PET scan done on today  Protocols used: BREATHING  DIFFICULTY-A-AH

## 2018-07-12 ENCOUNTER — Telehealth: Payer: Self-pay | Admitting: *Deleted

## 2018-07-12 ENCOUNTER — Inpatient Hospital Stay (HOSPITAL_BASED_OUTPATIENT_CLINIC_OR_DEPARTMENT_OTHER): Payer: Medicare Other | Admitting: Medical

## 2018-07-12 ENCOUNTER — Ambulatory Visit: Payer: Medicare Other | Admitting: Family Medicine

## 2018-07-12 ENCOUNTER — Other Ambulatory Visit: Payer: Self-pay | Admitting: Medical

## 2018-07-12 ENCOUNTER — Telehealth: Payer: Self-pay | Admitting: Family Medicine

## 2018-07-12 ENCOUNTER — Other Ambulatory Visit: Payer: Self-pay | Admitting: *Deleted

## 2018-07-12 VITALS — BP 153/88 | HR 99 | Temp 97.5°F | Resp 18 | Ht 66.0 in | Wt 119.0 lb

## 2018-07-12 DIAGNOSIS — C3412 Malignant neoplasm of upper lobe, left bronchus or lung: Secondary | ICD-10-CM

## 2018-07-12 DIAGNOSIS — C3411 Malignant neoplasm of upper lobe, right bronchus or lung: Secondary | ICD-10-CM

## 2018-07-12 DIAGNOSIS — J441 Chronic obstructive pulmonary disease with (acute) exacerbation: Secondary | ICD-10-CM | POA: Diagnosis not present

## 2018-07-12 DIAGNOSIS — Z9221 Personal history of antineoplastic chemotherapy: Secondary | ICD-10-CM | POA: Diagnosis not present

## 2018-07-12 DIAGNOSIS — I10 Essential (primary) hypertension: Secondary | ICD-10-CM | POA: Diagnosis not present

## 2018-07-12 DIAGNOSIS — Z923 Personal history of irradiation: Secondary | ICD-10-CM | POA: Diagnosis not present

## 2018-07-12 DIAGNOSIS — Z791 Long term (current) use of non-steroidal anti-inflammatories (NSAID): Secondary | ICD-10-CM | POA: Diagnosis not present

## 2018-07-12 DIAGNOSIS — J449 Chronic obstructive pulmonary disease, unspecified: Secondary | ICD-10-CM

## 2018-07-12 DIAGNOSIS — N182 Chronic kidney disease, stage 2 (mild): Secondary | ICD-10-CM | POA: Diagnosis not present

## 2018-07-12 DIAGNOSIS — Z79899 Other long term (current) drug therapy: Secondary | ICD-10-CM

## 2018-07-12 DIAGNOSIS — R0602 Shortness of breath: Secondary | ICD-10-CM

## 2018-07-12 MED ORDER — SULFAMETHOXAZOLE-TRIMETHOPRIM 800-160 MG PO TABS: 1.0000 | ORAL_TABLET | Freq: Two times a day (BID) | ORAL | 0 refills | Status: DC

## 2018-07-12 MED ORDER — TRAMADOL HCL 50 MG PO TABS: 50.0000 mg | ORAL_TABLET | Freq: Four times a day (QID) | ORAL | 0 refills | Status: AC | PRN

## 2018-07-12 MED ORDER — PREDNISONE 5 MG PO TABS: ORAL_TABLET | ORAL | 0 refills | Status: DC

## 2018-07-12 MED ORDER — ALBUTEROL SULFATE (2.5 MG/3ML) 0.083% IN NEBU
INHALATION_SOLUTION | RESPIRATORY_TRACT | Status: AC
  Filled 2018-07-12: qty 3

## 2018-07-12 MED ORDER — PREDNISONE 20 MG PO TABS
20.0000 mg | ORAL_TABLET | Freq: Once | ORAL | Status: AC
  Administered 2018-07-12: 20 mg via ORAL

## 2018-07-12 MED ORDER — ALBUTEROL SULFATE (2.5 MG/3ML) 0.083% IN NEBU
2.5000 mg | INHALATION_SOLUTION | Freq: Once | RESPIRATORY_TRACT | Status: AC
  Administered 2018-07-12: 2.5 mg via RESPIRATORY_TRACT
  Filled 2018-07-12: qty 3

## 2018-07-12 NOTE — Telephone Encounter (Signed)
Beth,  Would you please contact this patient and have them go to radiology for a chest x-ray prior to being seen by me today?  Thanks, Lucianne Lei

## 2018-07-12 NOTE — Telephone Encounter (Signed)
Received call from [place] @ [facility] in [place]. Pt was scheduled to see an MD there but he is not available.  Diane is asking if pt can be seen here.  TCT patient. She states she is having worsening SOB in the last 2 days. She does sound SOB on the phone-speaking in short sentences, taking deeper breathes between words etc.  She denies fever or chills but has a dry, unproductive cough. Spoke with Dr. Burnett Sheng would like her to be seen in [facility] clinic today.  Pt aware and agreeable to come in this morning for labs and [facility].  High priority scheduling message sent.

## 2018-07-12 NOTE — Telephone Encounter (Signed)
I just now saw this message.  Sorry. Did not call pt   Leah Howell

## 2018-07-12 NOTE — Telephone Encounter (Signed)
Patient has been scheduled at [facility] today 07/12/18

## 2018-07-12 NOTE — Telephone Encounter (Signed)
Patient is having shortness of breath. Primary provider is sick. Patient would like to be seen. I've left a message on the nurse triage line at [facility] to see if there are any appointments available.

## 2018-07-12 NOTE — Telephone Encounter (Signed)
"  Leah Howell with [facility] at [phone]).  [facility]ual patient Leah Howell scheduled here today through our Triage line.  Would like to get her seen there sooner than 07-23-2018.  Call her or me."

## 2018-07-12 NOTE — Telephone Encounter (Signed)
Dr. Julien Nordmann notified of Leah Howell shortness of breath, request per [facility] triage.  Verbal order received and read back for [facility]

## 2018-07-12 NOTE — Progress Notes (Signed)
[facility] Progress Note   SEVEN MARENGO [id number] 1946-02-05 73 y.o.  Leah Howell is managed by Dr. Fanny Bien. Leah Howell  Actively treated with chemotherapy/immunotherapy/hormonal therapy: no  Next scheduled appointment with provider: 07/23/2018  Assessment: Plan:    COPD exacerbation ([place]) - Plan: albuterol (PROVENTIL) (2.5 MG/3ML) 0.083% nebulizer solution 2.5 mg, predniSONE (DELTASONE) tablet 20 mg  Cancer of upper lobe of right lung (HCC)   COPD exacerbation: Patient was given an albuterol nebulizer treatment.  She was also given Bactrim DS, prednisone 20 mg p.o. x1 now and a prednisone taper which she will begin tomorrow.  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  The results returned showing:  Core biopsies reveal high grade malignant cells with a somewhat spindle morphology. Immunohistochemistry is positive for PAX-8, GATA-3 (weak), and p63 (weak). The cells are negative for pancytokeratin, cytokeratin 7, cytokeratin 20, cytokeratin 5/6, cytokeratin903, cytokeratin 8/18, MOC31, MelanA, S100, demsin, SMA, calretinin, WT-1, CD117, CD34, CD5, CD56, synaptophysin, and ER. The immunoprofile is non-specific and possibilities include sarcoma or poorly differentiated carcinoma among others.  Additionally the patient's PET scan from 07/11/2018 was reviewed with the patient.  These results returned showing:  5 cm hypermetabolic smoothly marginated mass involving the anterior left upper lobe and left chest wall. This could represent a primary bronchogenic carcinoma or metastatic disease. Hypermetabolic anterior mediastinal and chest wall soft tissue density causing destruction of the sternal manubrium, consistent with metastatic disease.  4 cm hypermetabolic mediastinal mass in the left cardiophrenic angle, consistent with metastatic disease. Small left pleural effusion  may be malignant in etiology.  Two sub-cm hypermetabolic pulmonary metastases in the right lower lobe.  Hypermetabolic bone metastases involving the sternum and right pelvis.  She was given a prescription for tramadol 50 mg every 6 hours PRN for pain and was told to return as scheduled to see Dr. Julien Nordmann on 07/23/2018.   Please see After Visit Summary for patient specific instructions.  Future Appointments  Date Time Provider [place]  07/23/2018 11:30 AM [facility] None  07/23/2018 12:00 PM Curt Bears, MD [facility] None  08/29/2018  9:45 AM McGowen, [name], MD [facility]    No orders of the defined types were placed in this encounter.      Subjective:   Patient ID:  Leah Howell is a 73 y.o. (DOB 03-07-1946) female.  Chief Complaint: No chief complaint on file.   HPI Leah Howell is a 73 year old female with a history of COPD who presents to the clinic today with a cough, shortness of breath, and facial swelling.  She has a history of a stage IIIa non-small cell lung cancer, squamous cell carcinoma which was originally diagnosed in October 2008.  She was treated with concurrent radiation and weekly chemotherapy with carboplatin and paclitaxel.  She completed therapy on 04/05/2007.  This was followed with 3 cycles of consolidation chemotherapy with docetaxel which was last dose on 07/15/2007.  She presents to the clinic today having last seen Dr. Julien Nordmann on 07/03/2018 after having CT scans which showed:  1. Lobular mass in the LEFT upper lobe is most consistent with bronchogenic carcinoma. Concern for extension into the chest wall at the level of the first and second rib medially. FDG PET scan could aid in evaluation of chest wall invasion. 2. No evidence of metastatic mediastinal adenopathy. 3. Postsurgical change in the RIGHT lung without evidence of local  recurrence.  She was referred for a CT guided biopsy of a left pleural  mass on 07/04/2018 which returned with pathology showing:  Core biopsies reveal high grade malignant cells with a somewhat spindle morphology. Immunohistochemistry is positive for PAX-8, GATA-3 (weak), and p63 (weak). The cells are negative for pancytokeratin, cytokeratin 7, cytokeratin 20, cytokeratin 5/6, cytokeratin903, cytokeratin 8/18, MOC31, MelanA, S100, demsin, SMA, calretinin, WT-1, CD117, CD34, CD5, CD56, synaptophysin, and ER. The immunoprofile is non-specific and possibilities include sarcoma or poorly differentiated carcinoma among others.  A PET scan from 07/11/2018 returned showing:  5 cm hypermetabolic smoothly marginated mass involving the anterior left upper lobe and left chest wall. This could represent a primary bronchogenic carcinoma or metastatic disease. Hypermetabolic anterior mediastinal and chest wall soft tissue density causing destruction of the sternal manubrium, consistent with metastatic disease.  4 cm hypermetabolic mediastinal mass in the left cardiophrenic angle, consistent with metastatic disease. Small left pleural effusion may be malignant in etiology.  Two sub-cm hypermetabolic pulmonary metastases in the right lower lobe.  Hypermetabolic bone metastases involving the sternum and right pelvis.  The patient presents to the clinic today with her daughter.  She is having shortness of breath, cough, and facial swelling.  She denies fevers, chills, sweats, nausea, vomiting, constipation, or diarrhea.  She request the results of her biopsy and PET scan.  She is scheduled to see Dr. Julien Nordmann for follow-up on 07/23/2018.  Medications: I have reviewed the patient's current medications.  Allergies: No Known Allergies  Past Medical History:  Diagnosis Date  . Chronic renal insufficiency, stage 2 (mild)    GFR 60s  . COPD (chronic obstructive pulmonary disease) ([place])   . Hyperlipidemia    Mild--TLC  . Personal history of lung cancer    2008; upper lobe R  lung (Dr. Earlie Server).  Got chemo and rad.  Has been on observation and monitoring with annual CT's of chest since 2009.  2017 was "released" by onc.  06/2018->LUL mass; bx 07/04/18 result pending. MRI brain and PET CT pending.    Past Surgical History:  Procedure Laterality Date  . LUNG BIOPSY  07/04/2018   LUL mass  . MEDIASTINOSCOPY  2008   for bx of lung mass (Dr. Arlyce Dice)  . TUBAL LIGATION      Family History  Problem Relation Age of Onset  . Alcohol abuse Mother   . Heart disease Mother   . Alcohol abuse Father   . Liver disease Brother     Social History   Socioeconomic History  . Marital status: Married    Spouse name: Not on file  . Number of children: Not on file  . Years of education: Not on file  . Highest education level: Not on file  Occupational History  . Not on file  Social Needs  . Financial resource strain: Not on file  . Food insecurity:    Worry: Not on file    Inability: Not on file  . Transportation needs:    Medical: Not on file    Non-medical: Not on file  Tobacco Use  . Smoking status: Former Smoker    Types: Cigarettes    Last attempt to quit: 05/15/2002    Years since quitting: 16.1  . Smokeless tobacco: Never Used  Substance and Sexual Activity  . Alcohol use: No  . Drug use: No  . Sexual activity: Yes    Birth control/protection: Post-menopausal  Lifestyle  . Physical activity:    Days per week: Not  on file    Minutes per session: Not on file  . Stress: Not on file  Relationships  . Social connections:    Talks on phone: Not on file    Gets together: Not on file    Attends religious service: Not on file    Active member of club or organization: Not on file    Attends meetings of clubs or organizations: Not on file    Relationship status: Not on file  . Intimate partner violence:    Fear of current or ex partner: Not on file    Emotionally abused: Not on file    Physically abused: Not on file    Forced sexual activity: Not on  file  Other Topics Concern  . Not on file  Social History Narrative   Married, one daughter.   HS education.  Homemaker.   Former smoker; quit 2004.   No alcohol or drugs.     Exercise: walking     Past Medical History, Surgical history, Social history, and Family history were reviewed and updated as appropriate.   Please see review of systems for further details on the patient's review from today.   Review of Systems:  Review of Systems  Constitutional: Negative for chills, diaphoresis, fatigue and fever.  HENT: Negative for congestion, postnasal drip, rhinorrhea, sore throat, trouble swallowing and voice change.   Respiratory: Positive for cough and shortness of breath. Negative for choking, chest tightness, wheezing and stridor.   Cardiovascular: Positive for chest pain. Negative for palpitations.  Gastrointestinal: Negative for abdominal pain, constipation, diarrhea, nausea and vomiting.  Musculoskeletal: Negative for back pain and myalgias.  Neurological: Negative for dizziness, light-headedness and headaches.    Objective:   Physical Exam:  BP (!) 153/88 (BP Location: Left Arm, Patient Position: Sitting) Comment: nurse aware of bp  Pulse 99   Temp (!) 97.5 F (36.4 C) (Oral)   Resp 18   Ht '5\' 6"'  (1.676 m)   Wt 119 lb (54 kg)   SpO2 97%   BMI 19.21 kg/m  ECOG: 1  Physical Exam Constitutional:      General: She is not in acute distress.    Appearance: She is not diaphoretic.  HENT:     Head: Atraumatic.     Comments: Trace facial swelling noted.    Right Ear: External ear normal.     Left Ear: External ear normal.     Mouth/Throat:     Pharynx: No oropharyngeal exudate.  Eyes:     General: No scleral icterus.       Right eye: No discharge.        Left eye: No discharge.     Conjunctiva/sclera: Conjunctivae normal.  Neck:     Musculoskeletal: Normal range of motion and neck supple.  Cardiovascular:     Rate and Rhythm: Normal rate and regular rhythm.      Heart sounds: Normal heart sounds. No murmur. No friction rub. No gallop.   Pulmonary:     Effort: Pulmonary effort is normal. No respiratory distress.     Breath sounds: Rales (Diffuse Rales throughout all lung fields.) present. No wheezing.  Abdominal:     General: Abdomen is flat. Bowel sounds are normal. There is no distension.     Palpations: Abdomen is soft.     Tenderness: There is no abdominal tenderness. There is no guarding.  Musculoskeletal:     Right lower leg: No edema.     Left lower leg: No  edema.  Lymphadenopathy:     Cervical: No cervical adenopathy.  Skin:    General: Skin is warm and dry.     Findings: No erythema or rash.  Neurological:     Mental Status: She is alert.     Coordination: Coordination normal.  Psychiatric:        Behavior: Behavior normal.        Thought Content: Thought content normal.        Judgment: Judgment normal.    Pre-albuterol exam: See above physical exam  Post-albuterol exam: Rales resolve after an albuterol nebulizer was administered.  Lab Review:     Component Value Date/Time   NA 133 (L) 07/03/2018 1312   NA 140 02/16/2016 0955   K 4.3 07/03/2018 1312   K 4.4 02/16/2016 0955   CL 98 07/03/2018 1312   CL 105 01/22/2012 0830   CO2 25 07/03/2018 1312   CO2 24 02/16/2016 0955   GLUCOSE 91 07/03/2018 1312   GLUCOSE 101 02/16/2016 0955   GLUCOSE 93 01/22/2012 0830   BUN 9 07/03/2018 1312   BUN 10.0 02/16/2016 0955   CREATININE 0.80 07/03/2018 1312   CREATININE 0.8 02/16/2016 0955   CALCIUM 9.4 07/03/2018 1312   CALCIUM 9.5 02/16/2016 0955   PROT 6.8 07/03/2018 1312   PROT 6.7 02/16/2016 0955   ALBUMIN 4.0 07/03/2018 1312   ALBUMIN 3.8 02/16/2016 0955   AST 26 07/03/2018 1312   AST 36 (H) 02/16/2016 0955   ALT 15 07/03/2018 1312   ALT 30 02/16/2016 0955   ALKPHOS 86 07/03/2018 1312   ALKPHOS 70 02/16/2016 0955   BILITOT 0.6 07/03/2018 1312   BILITOT 0.64 02/16/2016 0955   GFRNONAA >60 07/03/2018 1312   GFRAA >60  07/03/2018 1312       Component Value Date/Time   WBC 5.6 07/04/2018 1024   RBC 4.41 07/04/2018 1024   HGB 14.8 07/04/2018 1024   HGB 14.1 07/03/2018 1312   HGB 15.3 02/16/2016 0955   HCT 44.2 07/04/2018 1024   HCT 44.6 02/16/2016 0955   PLT 209 07/04/2018 1024   PLT 173 07/03/2018 1312   PLT 130 (L) 02/16/2016 0955   MCV 100.2 (H) 07/04/2018 1024   MCV 98.7 02/16/2016 0955   MCH 33.6 07/04/2018 1024   MCHC 33.5 07/04/2018 1024   RDW 13.2 07/04/2018 1024   RDW 14.0 02/16/2016 0955   LYMPHSABS 0.6 (L) 07/03/2018 1312   LYMPHSABS 0.5 (L) 02/16/2016 0955   MONOABS 0.4 07/03/2018 1312   MONOABS 0.4 02/16/2016 0955   EOSABS 0.2 07/03/2018 1312   EOSABS 0.2 02/16/2016 0955   BASOSABS 0.0 07/03/2018 1312   BASOSABS 0.0 02/16/2016 0955   -------------------------------  Imaging from last 24 hours (if applicable):  Radiology interpretation: Ct Angio Chest Pe W Or Wo Contrast  Result Date: 06/18/2018 CLINICAL DATA:  Respiratory distress. COPD exacerbation. History of RIGHT lung cancer diagnosed 2008. EXAM: CT ANGIOGRAPHY CHEST WITH CONTRAST TECHNIQUE: Multidetector CT imaging of the chest was performed using the standard protocol during bolus administration of intravenous contrast. Multiplanar CT image reconstructions and MIPs were obtained to evaluate the vascular anatomy. CONTRAST:  170m ISOVUE-370 IOPAMIDOL (ISOVUE-370) INJECTION 76% COMPARISON:  Chest CT 02/28/2016 FINDINGS: Cardiovascular: No filling defects within the pulmonary arteries to suggest acute pulmonary embolism. No acute findings of the aorta or great vessels. No pericardial fluid. Coronary artery calcification and aortic atherosclerotic calcification. Mediastinum/Nodes: No axillary supraclavicular adenopathy. Mediastinal hilar adenopathy. Lungs/Pleura: Postsurgical change consistent with RIGHT upper lobectomy. Pleural  thickening at the RIGHT lung apex is unchanged. No nodularity in the RIGHT lung. A LEFT upper lobe  rounded mass extends to the anterior pleural surface measuring 3 cm x 2.7 cm (image 41/9). Mass shares a broad surface with pleura. There is thickening at the costochondral junction of the anterior LEFT second rib which could indicate extension into the chest wall (image 25/7). Potential involvement of the medial LEFT first rib. Upper Abdomen: Limited view of the liver, kidneys, pancreas are unremarkable. Normal adrenal glands. Several low-density lesions in the dome liver and to lesser degree the LEFT hepatic lobe are not changed. Adrenal glands partially imaged appear normal. Musculoskeletal: No aggressive osseous lesion Review of the MIP images confirms the above findings. IMPRESSION: 1. Lobular mass in the LEFT upper lobe is most consistent with bronchogenic carcinoma. Concern for extension into the chest wall at the level of the first and second rib medially. FDG PET scan could aid in evaluation of chest wall invasion. 2. No evidence of metastatic mediastinal adenopathy. 3. Postsurgical change in the RIGHT lung without evidence of local recurrence. These results will be called to the ordering clinician or representative by the Radiologist Assistant, and communication documented in the PACS or zVision Dashboard. Electronically Signed   By: Suzy Bouchard M.D.   On: 06/18/2018 10:33   Nm Pet Image Restag (ps) Skull Base To Thigh  Result Date: 07/12/2018 CLINICAL DATA:  Initial treatment strategy for left upper lobe lung mass. Personal history of right lung carcinoma. EXAM: NUCLEAR MEDICINE PET SKULL BASE TO THIGH TECHNIQUE: 5.9 mCi F-18 FDG was injected intravenously. Full-ring PET imaging was performed from the skull base to thigh after the radiotracer. CT data was obtained and used for attenuation correction and anatomic localization. Fasting blood glucose: 101 mg/dl COMPARISON:  CT on 06/18/2018 FINDINGS: (Background mediastinal blood pool activity: SUV max = 2.2) NECK:  No hypermetabolic lymph nodes or  masses. Incidental CT findings:  None. CHEST: Smoothly marginated mass is seen involving the left upper lobe and left anterior chest wall at the anterior 1st intercostal space. This measures 5.0 x 3.6 cm and has SUV max of 18.4. In addition, there is abnormal soft tissue density in the anterior mediastinum and chest wall with destruction of the sternal manubrium. In aggregate, this measures 5.1 x 2.8 cm on image 57/4, and has SUV max of 14.7. Smoothly marginated hypermetabolic mass or lymphadenopathy is also seen in the left anterior cardiophrenic angle, which measures 4.2 x 3.1 cm on image 88/4, and has SUV max of 14.3. A small left pleural effusion is noted. Two pulmonary nodules are also seen in the posterior right lower lobe which are hypermetabolic, with SUV max up to 2.2. These are consistent with pulmonary metastases. Incidental CT findings: Stable postop changes seen from previous right upper lobectomy. ABDOMEN/PELVIS: No abnormal hypermetabolic activity within the liver, pancreas, adrenal glands, or spleen. No hypermetabolic lymph nodes in the abdomen or pelvis. Incidental CT findings:  None. SKELETON: Several hypermetabolic bone lesions are seen right ilium, right sacrum, and right ischial tuberosity, consistent with bone metastases. Largest metastatic lesion in the right ischial tuberosity has an SUV max of 4.7. Incidental CT findings:  None. IMPRESSION: 5 cm hypermetabolic smoothly marginated mass involving the anterior left upper lobe and left chest wall. This could represent a primary bronchogenic carcinoma or metastatic disease. Hypermetabolic anterior mediastinal and chest wall soft tissue density causing destruction of the sternal manubrium, consistent with metastatic disease. 4 cm hypermetabolic mediastinal mass in the  left cardiophrenic angle, consistent with metastatic disease. Small left pleural effusion may be malignant in etiology. Two sub-cm hypermetabolic pulmonary metastases in the right  lower lobe. Hypermetabolic bone metastases involving the sternum and right pelvis. Electronically Signed   By: Earle Gell M.D.   On: 07/12/2018 09:52   Ct Biopsy  Result Date: 07/04/2018 CLINICAL DATA:  History of right lung carcinoma post chemotherapy and radiation. New anterior left pleural based thoracic mass. EXAM: CT GUIDED CORE BIOPSY OF LEFT PLEURAL MASS ANESTHESIA/SEDATION: Intravenous Fentanyl and Versed were administered as conscious sedation during continuous monitoring of the patient's level of consciousness and physiological / cardiorespiratory status by the radiology RN, with a total moderate sedation time of 16 minutes. PROCEDURE: The procedure risks, benefits, and alternatives were explained to the patient. Questions regarding the procedure were encouraged and answered. The patient understands and consents to the procedure. Select axial scans through the thorax were obtained. The left anterior pleural/body wall mass was localized and an appropriate skin entry site was determined and marked. The operative field was prepped with chlorhexidinein a sterile fashion, and a sterile drape was applied covering the operative field. A sterile gown and sterile gloves were used for the procedure. Local anesthesia was provided with 1% Lidocaine. Under CT fluoroscopic guidance, a 17 gauge trocar needle was advanced to the margin of the lesion. Once needle tip position was confirmed, coaxial 18-gauge core biopsy samples were obtained, submitted in formalin to surgical pathology. The guide needle was removed. Postprocedure scans show no hemorrhage, pneumothorax, or other apparent complication The patient tolerated the procedure well. COMPLICATIONS: None immediate FINDINGS: Anterior left pleural/chest wall mass was localized. Representative core biopsy samples obtained as above. IMPRESSION: 1. Technically successful CT-guided core biopsy, left anterior pleural based mass. Electronically Signed   By: Lucrezia Europe  M.D.   On: 07/04/2018 14:22      albuterol

## 2018-07-16 ENCOUNTER — Encounter: Payer: Self-pay | Admitting: Family Medicine

## 2018-07-16 ENCOUNTER — Ambulatory Visit: Payer: Medicare Other | Admitting: Family Medicine

## 2018-07-22 ENCOUNTER — Other Ambulatory Visit: Payer: Self-pay | Admitting: Medical Oncology

## 2018-07-22 DIAGNOSIS — C3411 Malignant neoplasm of upper lobe, right bronchus or lung: Secondary | ICD-10-CM

## 2018-07-23 ENCOUNTER — Inpatient Hospital Stay (HOSPITAL_BASED_OUTPATIENT_CLINIC_OR_DEPARTMENT_OTHER): Payer: Medicare Other | Admitting: Internal Medicine

## 2018-07-23 ENCOUNTER — Inpatient Hospital Stay: Payer: Medicare Other | Attending: Internal Medicine

## 2018-07-23 ENCOUNTER — Telehealth: Payer: Self-pay | Admitting: Internal Medicine

## 2018-07-23 ENCOUNTER — Encounter: Payer: Self-pay | Admitting: Internal Medicine

## 2018-07-23 VITALS — BP 155/84 | HR 101 | Temp 98.5°F | Resp 20 | Ht 66.0 in | Wt 114.4 lb

## 2018-07-23 DIAGNOSIS — C781 Secondary malignant neoplasm of mediastinum: Secondary | ICD-10-CM | POA: Diagnosis not present

## 2018-07-23 DIAGNOSIS — Z5112 Encounter for antineoplastic immunotherapy: Secondary | ICD-10-CM | POA: Diagnosis not present

## 2018-07-23 DIAGNOSIS — C3412 Malignant neoplasm of upper lobe, left bronchus or lung: Secondary | ICD-10-CM | POA: Insufficient documentation

## 2018-07-23 DIAGNOSIS — Z9221 Personal history of antineoplastic chemotherapy: Secondary | ICD-10-CM | POA: Insufficient documentation

## 2018-07-23 DIAGNOSIS — C3411 Malignant neoplasm of upper lobe, right bronchus or lung: Secondary | ICD-10-CM

## 2018-07-23 DIAGNOSIS — G893 Neoplasm related pain (acute) (chronic): Secondary | ICD-10-CM | POA: Diagnosis not present

## 2018-07-23 DIAGNOSIS — C7951 Secondary malignant neoplasm of bone: Secondary | ICD-10-CM

## 2018-07-23 DIAGNOSIS — R5383 Other fatigue: Secondary | ICD-10-CM | POA: Diagnosis not present

## 2018-07-23 DIAGNOSIS — R7989 Other specified abnormal findings of blood chemistry: Secondary | ICD-10-CM | POA: Insufficient documentation

## 2018-07-23 DIAGNOSIS — C3492 Malignant neoplasm of unspecified part of left bronchus or lung: Secondary | ICD-10-CM

## 2018-07-23 DIAGNOSIS — Z5111 Encounter for antineoplastic chemotherapy: Secondary | ICD-10-CM | POA: Diagnosis not present

## 2018-07-23 DIAGNOSIS — Z85118 Personal history of other malignant neoplasm of bronchus and lung: Secondary | ICD-10-CM | POA: Diagnosis not present

## 2018-07-23 DIAGNOSIS — Z7189 Other specified counseling: Secondary | ICD-10-CM

## 2018-07-23 DIAGNOSIS — R630 Anorexia: Secondary | ICD-10-CM | POA: Diagnosis not present

## 2018-07-23 DIAGNOSIS — Z923 Personal history of irradiation: Secondary | ICD-10-CM | POA: Diagnosis not present

## 2018-07-23 LAB — CBC WITH DIFFERENTIAL (CANCER CENTER ONLY)
Abs Immature Granulocytes: 0.02 10*3/uL (ref 0.00–0.07)
Basophils Absolute: 0 10*3/uL (ref 0.0–0.1)
Basophils Relative: 1 %
Eosinophils Absolute: 0.1 10*3/uL (ref 0.0–0.5)
Eosinophils Relative: 1 %
HCT: 43.5 % (ref 36.0–46.0)
Hemoglobin: 14.4 g/dL (ref 12.0–15.0)
Immature Granulocytes: 0 %
LYMPHS ABS: 0.5 10*3/uL — AB (ref 0.7–4.0)
Lymphocytes Relative: 10 %
MCH: 33.6 pg (ref 26.0–34.0)
MCHC: 33.1 g/dL (ref 30.0–36.0)
MCV: 101.4 fL — AB (ref 80.0–100.0)
MONO ABS: 0.5 10*3/uL (ref 0.1–1.0)
MONOS PCT: 9 %
Neutro Abs: 4.2 10*3/uL (ref 1.7–7.7)
Neutrophils Relative %: 79 %
Platelet Count: 192 10*3/uL (ref 150–400)
RBC: 4.29 MIL/uL (ref 3.87–5.11)
RDW: 12.8 % (ref 11.5–15.5)
WBC Count: 5.3 10*3/uL (ref 4.0–10.5)
nRBC: 0 % (ref 0.0–0.2)

## 2018-07-23 LAB — CMP (CANCER CENTER ONLY)
ALT: 22 U/L (ref 0–44)
AST: 23 U/L (ref 15–41)
Albumin: 4.2 g/dL (ref 3.5–5.0)
Alkaline Phosphatase: 82 U/L (ref 38–126)
Anion gap: 11 (ref 5–15)
BILIRUBIN TOTAL: 0.6 mg/dL (ref 0.3–1.2)
BUN: 9 mg/dL (ref 8–23)
CALCIUM: 9.8 mg/dL (ref 8.9–10.3)
CO2: 26 mmol/L (ref 22–32)
CREATININE: 0.82 mg/dL (ref 0.44–1.00)
Chloride: 96 mmol/L — ABNORMAL LOW (ref 98–111)
GFR, Est AFR Am: >60 mL/min (ref >60)
GFR, Estimated: >60 mL/min (ref >60)
Glucose, Bld: 98 mg/dL (ref 70–99)
Potassium: 4.4 mmol/L (ref 3.5–5.1)
Sodium: 133 mmol/L — ABNORMAL LOW (ref 135–145)
Total Protein: 7.3 g/dL (ref 6.5–8.1)

## 2018-07-23 NOTE — Progress Notes (Signed)
[facility] [phone]   Fax:([phone]) [phone]  OFFICE PROGRESS NOTE  Tammi Sou, MD [address] 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  2)  Stage IIIA (TX, N2, MX) non-small cell lung cancer, squamous cell carcinoma diagnosed in October 2008.   PRIOR THERAPY:  1. Status post concurrent chemoradiation with weekly carboplatin and paclitaxel. Last dose was given April 05, 2007. 2. Status post 3 cycles of consolidation chemotherapy with docetaxel, last dose was given July 15, 2007.  CURRENT THERAPY: .  INTERVAL HISTORY: Leah Howell 73 y.o. female returns to the clinic today for follow-up visit accompanied by her daughter and sister.  The patient continues to complain of the swelling of the face as well as left upper anterior and posterior chest wall pain.  She also has shortness of breath with exertion with cough with no hemoptysis.  She denied having any recent weight loss or night sweats.  She has no nausea, vomiting, diarrhea or constipation.  She denied having any headache or visual changes.  She was seen by Dr. Sondra Come few weeks ago for consideration of palliative radiotherapy to the left upper lobe lung mass.  He was waiting until the availability of the tissue biopsy and the PET scan results.  The patient underwent CT-guided core biopsy of the left upper lobe lung mass by interventional radiology and the final pathology was consistent with high-grade neoplasm suspicious for poorly differentiated carcinoma or sarcoma.  She is here today for evaluation and discussion of her treatment options.  MEDICAL  HISTORY: Past Medical History:  Diagnosis Date  . Cancer of upper lobe of left lung ([place]) 06/2018   Bx+ carcinoma, PET + mediastinal mets, R lung mets, pelvic region bone mets. MRI brain pending as of 3/3/.  Pt to see Dr. Earlie Server approx 3/10 to discuss plan.  . Chronic renal insufficiency, stage 2 (mild)    GFR 60s  . COPD (chronic obstructive pulmonary disease) ([facility])   . Hyperlipidemia    Mild--TLC  . Personal history of lung cancer 2008   2008; upper lobe R lung (Dr. Earlie Server).  Got chemo and rad.  Has been on observation and monitoring with annual CT's of chest since 2009.  2017 was "released" by onc.  06/2018->LUL mass;-->lung ca, +mets on PET scan.    ALLERGIES:  has No Known Allergies.  MEDICATIONS:  Current Outpatient Medications  Medication Sig Dispense Refill  . albuterol (ACCUNEB) 1.25 MG/3ML nebulizer solution Take 3 mLs (1.25 mg total) by nebulization every 6 (six) hours as needed. 75 mL 6  . albuterol (VENTOLIN HFA) 108 (90 Base) MCG/ACT inhaler Inhale 1-2 puffs into the lungs every 4 (four) hours as needed for wheezing or shortness of breath. 1 Inhaler 1  . budesonide-formoterol (SYMBICORT) 160-4.5 MCG/ACT inhaler Inhale 2 puffs into the lungs 2 (two) times daily. 3 Inhaler 3  . clonazePAM (KLONOPIN) 0.5 MG tablet 1-2 tabs po qhs prn insomnia and worry (Patient taking differently: Take 0.5-1 mg by mouth at bedtime as needed for anxiety. 1-2 tabs po qhs prn insomnia and worry) 60 tablet 3  . ibuprofen (ADVIL,MOTRIN) 200 MG  tablet Take 600 mg by mouth every 8 (eight) hours as needed.    Marland Kitchen ipratropium (ATROVENT) 0.03 % nasal spray Place 2 sprays into both nostrils every 12 (twelve) hours. (Patient taking differently: Place 2 sprays into both nostrils 2 (two) times daily as needed for rhinitis. ) 30 mL 5  . predniSONE (DELTASONE) 5 MG tablet 6 tab x 1 day, 5 tab x 1 day, 4 tab x 1 day, 3 tab x 1 day, 2 tab x 1 day, 1 tab x 1 day, stop 21 tablet 0  . sulfamethoxazole-trimethoprim  (BACTRIM DS,SEPTRA DS) 800-160 MG tablet Take 1 tablet by mouth 2 (two) times daily. 14 tablet 0   No current facility-administered medications for this visit.     SURGICAL HISTORY:  Past Surgical History:  Procedure Laterality Date  . LUNG BIOPSY  07/04/2018   LUL mass  . MEDIASTINOSCOPY  2008   for bx of lung mass (Dr. Arlyce Dice)  . TUBAL LIGATION      REVIEW OF SYSTEMS:  Constitutional: positive for fatigue Eyes: negative Ears, nose, mouth, throat, and face: negative Respiratory: positive for cough, dyspnea on exertion and pleurisy/chest pain Cardiovascular: negative Gastrointestinal: negative Genitourinary:negative Integument/breast: negative Hematologic/lymphatic: negative Musculoskeletal:positive for back pain Neurological: negative Behavioral/Psych: negative Endocrine: negative Allergic/Immunologic: negative   PHYSICAL EXAMINATION: General appearance: alert, cooperative, fatigued and no distress Head: Normocephalic, without obvious abnormality, atraumatic Neck: no adenopathy Lymph nodes: Cervical, supraclavicular, and axillary nodes normal. Resp: clear to auscultation bilaterally Back: symmetric, no curvature. ROM normal. No CVA tenderness. Cardio: regular rate and rhythm, S1, S2 normal, no murmur, click, rub or gallop GI: soft, non-tender; bowel sounds normal; no masses,  no organomegaly Extremities: extremities normal, atraumatic, no cyanosis or edema Neurologic: Alert and oriented X 3, normal strength and tone. Normal symmetric reflexes. Normal coordination and gait  ECOG PERFORMANCE STATUS: 1 - Symptomatic but completely ambulatory  Blood pressure (!) 155/84, pulse (!) 101, temperature 98.5 F (36.9 C), temperature source Oral, resp. rate 20, height 5\' 6"  (1.676 m), weight 114 lb 6.4 oz (51.9 kg), SpO2 98 %.  LABORATORY DATA: Lab Results  Component Value Date   WBC 5.3 07/23/2018   HGB 14.4 07/23/2018   HCT 43.5 07/23/2018   MCV 101.4 (H) 07/23/2018   PLT  192 07/23/2018      Chemistry      Component Value Date/Time   NA 133 (L) 07/03/2018 1312   NA 140 02/16/2016 0955   K 4.3 07/03/2018 1312   K 4.4 02/16/2016 0955   CL 98 07/03/2018 1312   CL 105 01/22/2012 0830   CO2 25 07/03/2018 1312   CO2 24 02/16/2016 0955   BUN 9 07/03/2018 1312   BUN 10.0 02/16/2016 0955   CREATININE 0.80 07/03/2018 1312   CREATININE 0.8 02/16/2016 0955      Component Value Date/Time   CALCIUM 9.4 07/03/2018 1312   CALCIUM 9.5 02/16/2016 0955   ALKPHOS 86 07/03/2018 1312   ALKPHOS 70 02/16/2016 0955   AST 26 07/03/2018 1312   AST 36 (H) 02/16/2016 0955   ALT 15 07/03/2018 1312   ALT 30 02/16/2016 0955   BILITOT 0.6 07/03/2018 1312   BILITOT 0.64 02/16/2016 0955       RADIOGRAPHIC STUDIES: Nm Pet Image Restag (ps) Skull Base To Thigh  Result Date: 07/12/2018 CLINICAL DATA:  Initial treatment strategy for left upper lobe lung mass. Personal history of right lung carcinoma. EXAM: NUCLEAR MEDICINE PET SKULL BASE TO THIGH TECHNIQUE: 5.9 mCi F-18  FDG was injected intravenously. Full-ring PET imaging was performed from the skull base to thigh after the radiotracer. CT data was obtained and used for attenuation correction and anatomic localization. Fasting blood glucose: 101 mg/dl COMPARISON:  CT on 06/18/2018 FINDINGS: (Background mediastinal blood pool activity: SUV max = 2.2) NECK:  No hypermetabolic lymph nodes or masses. Incidental CT findings:  None. CHEST: Smoothly marginated mass is seen involving the left upper lobe and left anterior chest wall at the anterior 1st intercostal space. This measures 5.0 x 3.6 cm and has SUV max of 18.4. In addition, there is abnormal soft tissue density in the anterior mediastinum and chest wall with destruction of the sternal manubrium. In aggregate, this measures 5.1 x 2.8 cm on image 57/4, and has SUV max of 14.7. Smoothly marginated hypermetabolic mass or lymphadenopathy is also seen in the left anterior cardiophrenic  angle, which measures 4.2 x 3.1 cm on image 88/4, and has SUV max of 14.3. A small left pleural effusion is noted. Two pulmonary nodules are also seen in the posterior right lower lobe which are hypermetabolic, with SUV max up to 2.2. These are consistent with pulmonary metastases. Incidental CT findings: Stable postop changes seen from previous right upper lobectomy. ABDOMEN/PELVIS: No abnormal hypermetabolic activity within the liver, pancreas, adrenal glands, or spleen. No hypermetabolic lymph nodes in the abdomen or pelvis. Incidental CT findings:  None. SKELETON: Several hypermetabolic bone lesions are seen right ilium, right sacrum, and right ischial tuberosity, consistent with bone metastases. Largest metastatic lesion in the right ischial tuberosity has an SUV max of 4.7. Incidental CT findings:  None. IMPRESSION: 5 cm hypermetabolic smoothly marginated mass involving the anterior left upper lobe and left chest wall. This could represent a primary bronchogenic carcinoma or metastatic disease. Hypermetabolic anterior mediastinal and chest wall soft tissue density causing destruction of the sternal manubrium, consistent with metastatic disease. 4 cm hypermetabolic mediastinal mass in the left cardiophrenic angle, consistent with metastatic disease. Small left pleural effusion may be malignant in etiology. Two sub-cm hypermetabolic pulmonary metastases in the right lower lobe. Hypermetabolic bone metastases involving the sternum and right pelvis. Electronically Signed   By: Earle Gell M.D.   On: 07/12/2018 09:52   Ct Biopsy  Result Date: 07/04/2018 CLINICAL DATA:  History of right lung carcinoma post chemotherapy and radiation. New anterior left pleural based thoracic mass. EXAM: CT GUIDED CORE BIOPSY OF LEFT PLEURAL MASS ANESTHESIA/SEDATION: Intravenous Fentanyl and Versed were administered as conscious sedation during continuous monitoring of the patient's level of consciousness and physiological /  cardiorespiratory status by the radiology RN, with a total moderate sedation time of 16 minutes. PROCEDURE: The procedure risks, benefits, and alternatives were explained to the patient. Questions regarding the procedure were encouraged and answered. The patient understands and consents to the procedure. Select axial scans through the thorax were obtained. The left anterior pleural/body wall mass was localized and an appropriate skin entry site was determined and marked. The operative field was prepped with chlorhexidinein a sterile fashion, and a sterile drape was applied covering the operative field. A sterile gown and sterile gloves were used for the procedure. Local anesthesia was provided with 1% Lidocaine. Under CT fluoroscopic guidance, a 17 gauge trocar needle was advanced to the margin of the lesion. Once needle tip position was confirmed, coaxial 18-gauge core biopsy samples were obtained, submitted in formalin to surgical pathology. The guide needle was removed. Postprocedure scans show no hemorrhage, pneumothorax, or other apparent complication The patient tolerated the  procedure well. COMPLICATIONS: None immediate FINDINGS: Anterior left pleural/chest wall mass was localized. Representative core biopsy samples obtained as above. IMPRESSION: 1. Technically successful CT-guided core biopsy, left anterior pleural based mass. Electronically Signed   By: Lucrezia Europe M.D.   On: 07/04/2018 14:22   ASSESSMENT AND PLAN: This is a very pleasant 73 years old white female with highly suspicious lung cancer involving the left upper lobe.  The patient has a history of stage IIIa non-small cell lung cancer status post concurrent chemoradiation followed by consolidation chemotherapy and has been observation since March of 2009 with no evidence for disease recurrence.  Her most recent imaging studies including CT angiogram of the chest showed development of left upper lobe lung mass with questionable chest wall  invasion. The patient also had a PET scan performed recently.  I personally and independently reviewed the PET scan images and discussed the result and showed the images to the patient and her family.  Her PET scan showed hypermetabolic mass involving the anterior left upper lobe and left chest wall in addition to hypermetabolic left lower lobe lung mass as well as metastatic disease in the bony pelvis. She also underwent CT-guided core biopsy of the left upper lobe lung mass and the final pathology was consistent with high-grade malignancy suspicious for poorly differentiated carcinoma but sarcoma cannot be excluded. I had a lengthy discussion with the patient and her family today about her current disease stage, prognosis and treatment options.  I explained to the patient that she has a very aggressive and incurable malignancy.  I discussed with the patient the goals of care and she understand that the treatment options would be of palliative nature.  She was given the option of palliative care and hospice referral versus consideration of palliative systemic chemotherapy with carboplatin for AUC of 5, paclitaxel 175 mg/M2 and Keytruda 200 mg IV every 3 weeks.  I discussed with the patient the adverse effect of this treatment including but not limited to alopecia, myelosuppression, nausea and vomiting, peripheral neuropathy, liver or renal dysfunction as well as the adverse effect of the immunotherapy including but not limited to immunotherapy mediated skin rash, diarrhea, inflammation of the lung, kidney, liver, thyroid or other endocrine dysfunction including type 1 diabetes mellitus. The patient is interested in treatment and she is expected to start the first cycle of this treatment in 2 weeks. In the meantime she will be referred back to Dr. Sondra Come for consideration of palliative radiotherapy to the left upper lobe lung mass with invasion of the chest wall. I will also request her tissue block to be sent  to [facility] for molecular studies and PDL 1 expression. The patient will also have chemo education class before starting the first cycle of this treatment. I will send prescription for Compazine 10 mg p.o. every 6 hours as needed for nausea to her pharmacy. The patient will come back for follow-up visit in 2 weeks for evaluation before starting the first cycle of her treatment. She was advised to call immediately if she has any concerning symptoms in the interval. The patient voices understanding of current disease status and treatment options and is in agreement with the current care plan.  All questions were answered. The patient knows to call the clinic with any problems, questions or concerns. We can certainly see the patient much sooner if necessary. I spent 35 minutes counseling the patient face to face. The total time spent in the appointment was 40 minutes. Disclaimer: This  note was dictated with voice recognition software. Similar sounding words can inadvertently be transcribed and may be missed upon review.

## 2018-07-23 NOTE — Telephone Encounter (Signed)
Scheduled appt per 3/10 los.  Printed calendar and avs.

## 2018-07-23 NOTE — Progress Notes (Signed)
START ON PATHWAY REGIMEN - Non-Small Cell Lung     A cycle is every 21 days:     Pembrolizumab      Paclitaxel      Carboplatin   **Always confirm dose/schedule in your pharmacy ordering system**  Patient Characteristics: Stage IV Metastatic, Squamous, PS = 0, 1, First Line, PD-L1 Expression Positive 1-49% (TPS) / Negative / Not Tested / Awaiting Test Results and Immunotherapy Candidate AJCC T Category: T3 Current Disease Status: Distant Metastases AJCC N Category: N2 AJCC M Category: M1c AJCC 8 Stage Grouping: IVB Histology: Squamous Cell Line of therapy: First Line ECOG Performance Status: 1 PD-L1 Expression Status: Awaiting Test Results Immunotherapy Candidate Status: Candidate for Immunotherapy Intent of Therapy: Non-Curative / Palliative Intent, Discussed with Patient

## 2018-07-24 ENCOUNTER — Telehealth: Payer: Self-pay | Admitting: Medical Oncology

## 2018-07-24 ENCOUNTER — Encounter: Payer: Self-pay | Admitting: *Deleted

## 2018-07-24 NOTE — Progress Notes (Signed)
Oncology Nurse Navigator Documentation  Oncology Nurse Navigator Flowsheets 07/24/2018  Navigator Location [facility]  Navigator Encounter Type Other/per Dr. Julien Nordmann, I requested pathology to send out [facility] and pDL 1 testing on Ms. Leah Howell's recent biopsy [id number]  Treatment Phase Pre-Tx/Tx Discussion  Barriers/Navigation Needs Coordination of Care  Interventions Coordination of Care  Coordination of Care Other  Acuity Level 2  Time Spent with Patient 30

## 2018-07-24 NOTE — Telephone Encounter (Signed)
Medrol dose pak/neurontin- Has not been received by pharmacy [facility]. Pt does not have drug coverage and dtr thinks Neurontin may be very expensive. Would Leah Howell refill her tramadol Leah Howell) because  it helped some . She has 12 tablets left.

## 2018-07-26 ENCOUNTER — Other Ambulatory Visit: Payer: Self-pay | Admitting: *Deleted

## 2018-07-26 ENCOUNTER — Other Ambulatory Visit: Payer: Self-pay

## 2018-07-26 ENCOUNTER — Ambulatory Visit (HOSPITAL_COMMUNITY)
Admission: RE | Admit: 2018-07-26 | Discharge: 2018-07-26 | Disposition: A | Discharge status: Home / Self Care | Payer: Medicare Other | Source: Ambulatory Visit | Attending: Radiation Oncology | Admitting: Radiation Oncology

## 2018-07-26 DIAGNOSIS — C3490 Malignant neoplasm of unspecified part of unspecified bronchus or lung: Secondary | ICD-10-CM | POA: Insufficient documentation

## 2018-07-26 DIAGNOSIS — R51 Headache: Secondary | ICD-10-CM | POA: Diagnosis not present

## 2018-07-26 MED ORDER — GADOBUTROL 1 MMOL/ML IV SOLN
7.0000 mL | Freq: Once | INTRAVENOUS | Status: AC | PRN
  Administered 2018-07-26: 5 mL via INTRAVENOUS

## 2018-07-29 ENCOUNTER — Inpatient Hospital Stay (HOSPITAL_BASED_OUTPATIENT_CLINIC_OR_DEPARTMENT_OTHER): Payer: Medicare Other | Admitting: Medical

## 2018-07-29 ENCOUNTER — Telehealth: Payer: Self-pay | Admitting: Emergency Medicine

## 2018-07-29 ENCOUNTER — Inpatient Hospital Stay: Payer: Medicare Other

## 2018-07-29 ENCOUNTER — Ambulatory Visit: Payer: Self-pay

## 2018-07-29 ENCOUNTER — Other Ambulatory Visit: Payer: Self-pay

## 2018-07-29 ENCOUNTER — Ambulatory Visit
Admission: RE | Admit: 2018-07-29 | Discharge: 2018-07-29 | Disposition: A | Discharge status: Home / Self Care | Payer: Medicare Other | Source: Ambulatory Visit | Attending: Radiation Oncology | Admitting: Radiation Oncology

## 2018-07-29 VITALS — BP 130/80 | HR 101 | Temp 98.0°F | Resp 20 | Ht 66.0 in | Wt 115.0 lb

## 2018-07-29 DIAGNOSIS — Z510 Encounter for antineoplastic radiation therapy: Secondary | ICD-10-CM | POA: Insufficient documentation

## 2018-07-29 DIAGNOSIS — G893 Neoplasm related pain (acute) (chronic): Secondary | ICD-10-CM | POA: Diagnosis not present

## 2018-07-29 DIAGNOSIS — J449 Chronic obstructive pulmonary disease, unspecified: Secondary | ICD-10-CM | POA: Diagnosis not present

## 2018-07-29 DIAGNOSIS — Z85118 Personal history of other malignant neoplasm of bronchus and lung: Secondary | ICD-10-CM

## 2018-07-29 DIAGNOSIS — C3492 Malignant neoplasm of unspecified part of left bronchus or lung: Secondary | ICD-10-CM | POA: Diagnosis not present

## 2018-07-29 DIAGNOSIS — C3412 Malignant neoplasm of upper lobe, left bronchus or lung: Secondary | ICD-10-CM

## 2018-07-29 DIAGNOSIS — Z9221 Personal history of antineoplastic chemotherapy: Secondary | ICD-10-CM | POA: Diagnosis not present

## 2018-07-29 DIAGNOSIS — C7951 Secondary malignant neoplasm of bone: Secondary | ICD-10-CM

## 2018-07-29 DIAGNOSIS — C781 Secondary malignant neoplasm of mediastinum: Secondary | ICD-10-CM | POA: Diagnosis not present

## 2018-07-29 DIAGNOSIS — R7989 Other specified abnormal findings of blood chemistry: Secondary | ICD-10-CM | POA: Diagnosis not present

## 2018-07-29 DIAGNOSIS — Z5112 Encounter for antineoplastic immunotherapy: Secondary | ICD-10-CM | POA: Diagnosis not present

## 2018-07-29 DIAGNOSIS — R5383 Other fatigue: Secondary | ICD-10-CM

## 2018-07-29 DIAGNOSIS — R630 Anorexia: Secondary | ICD-10-CM

## 2018-07-29 DIAGNOSIS — C3411 Malignant neoplasm of upper lobe, right bronchus or lung: Secondary | ICD-10-CM

## 2018-07-29 DIAGNOSIS — Z5111 Encounter for antineoplastic chemotherapy: Secondary | ICD-10-CM | POA: Diagnosis not present

## 2018-07-29 MED ORDER — GABAPENTIN 100 MG PO CAPS: 100.0000 mg | ORAL_CAPSULE | Freq: Three times a day (TID) | ORAL | 2 refills | Status: DC

## 2018-07-29 MED ORDER — METHYLPREDNISOLONE 4 MG PO TBPK: ORAL_TABLET | ORAL | 0 refills | Status: DC

## 2018-07-29 MED ORDER — PREDNISONE 20 MG PO TABS
40.0000 mg | ORAL_TABLET | Freq: Once | ORAL | Status: AC
  Administered 2018-07-29: 40 mg via ORAL
  Filled 2018-07-29: qty 2

## 2018-07-29 MED ORDER — TRAMADOL HCL 50 MG PO TABS: 50.0000 mg | ORAL_TABLET | Freq: Four times a day (QID) | ORAL | 0 refills | Status: AC | PRN

## 2018-07-29 NOTE — Patient Instructions (Signed)
Chronic Obstructive Pulmonary Disease Chronic obstructive pulmonary disease (COPD) is a long-term (chronic) lung problem. When you have COPD, it is hard for air to get in and out of your lungs. Usually the condition gets worse over time, and your lungs will never return to normal. There are things you can do to keep yourself as healthy as possible.  Your doctor may treat your condition with: ? Medicines. ? Oxygen. ? Lung surgery.  Your doctor may also recommend: ? Rehabilitation. This includes steps to make your body work better. It may involve a team of specialists. ? Quitting smoking, if you smoke. ? Exercise and changes to your diet. ? Comfort measures (palliative care). Follow these instructions at home: Medicines  Take over-the-counter and prescription medicines only as told by your doctor.  Talk to your doctor before taking any cough or allergy medicines. You may need to avoid medicines that cause your lungs to be dry. Lifestyle  If you smoke, stop. Smoking makes the problem worse. If you need help quitting, ask your doctor.  Avoid being around things that make your breathing worse. This may include smoke, chemicals, and fumes.  Stay active, but remember to rest as well.  Learn and use tips on how to relax.  Make sure you get enough sleep. Most adults need at least 7 hours of sleep every night.  Eat healthy foods. Eat smaller meals more often. Rest before meals. Controlled breathing Learn and use tips on how to control your breathing as told by your doctor. Try:  Breathing in (inhaling) through your nose for 1 second. Then, pucker your lips and breath out (exhale) through your lips for 2 seconds.  Putting one hand on your belly (abdomen). Breathe in slowly through your nose for 1 second. Your hand on your belly should move out. Pucker your lips and breathe out slowly through your lips. Your hand on your belly should move in as you breathe out.  Controlled coughing Learn  and use controlled coughing to clear mucus from your lungs. Follow these steps: 1. Lean your head a little forward. 2. Breathe in deeply. 3. Try to hold your breath for 3 seconds. 4. Keep your mouth slightly open while coughing 2 times. 5. Spit any mucus out into a tissue. 6. Rest and do the steps again 1 or 2 times as needed. General instructions  Make sure you get all the shots (vaccines) that your doctor recommends. Ask your doctor about a flu shot and a pneumonia shot.  Use oxygen therapy and pulmonary rehabilitation if told by your doctor. If you need home oxygen therapy, ask your doctor if you should buy a tool to measure your oxygen level (oximeter).  Make a COPD action plan with your doctor. This helps you to know what to do if you feel worse than usual.  Manage any other conditions you have as told by your doctor.  Avoid going outside when it is very hot, cold, or humid.  Avoid people who have a sickness you can catch (contagious).  Keep all follow-up visits as told by your doctor. This is important. Contact a doctor if:  You cough up more mucus than usual.  There is a change in the color or thickness of the mucus.  It is harder to breathe than usual.  Your breathing is faster than usual.  You have trouble sleeping.  You need to use your medicines more often than usual.  You have trouble doing your normal activities such as getting dressed   or walking around the house. Get help right away if:  You have shortness of breath while resting.  You have shortness of breath that stops you from: ? Being able to talk. ? Doing normal activities.  Your chest hurts for longer than 5 minutes.  Your skin color is more blue than usual.  Your pulse oximeter shows that you have low oxygen for longer than 5 minutes.  You have a fever.  You feel too tired to breathe normally. Summary  Chronic obstructive pulmonary disease (COPD) is a long-term lung problem.  The way your  lungs work will never return to normal. Usually the condition gets worse over time. There are things you can do to keep yourself as healthy as possible.  Take over-the-counter and prescription medicines only as told by your doctor.  If you smoke, stop. Smoking makes the problem worse. This information is not intended to replace advice given to you by your health care provider. Make sure you discuss any questions you have with your health care provider. Document Released: 10/18/2007 Document Revised: 06/05/2016 Document Reviewed: 06/05/2016 Elsevier Interactive Patient Education  2019 [employer]vier Inc[employer]  

## 2018-07-29 NOTE — Telephone Encounter (Signed)
In coming call from Patient and Patient daughter.  Reports Patient having  SOB States she has had it all along, this particular weekend has been more difficult. Rates it severe.  Denies  Cardiac issues.  Has COPD.  Unstable on her feet. Reviewed Protocol with Patient's daughter.  Recommended Patient seek evaluation at ED.  Patient   Voices understanding.    Reason for Disposition . [1] MODERATE difficulty breathing (e.g., speaks in phrases, SOB even at rest, pulse 100-120) AND [2] NEW-onset or WORSE than normal  Answer Assessment - Initial Assessment Questions 1. RESPIRATORY STATUS: "Describe your breathing?" (e.g., wheezing, shortness of breath, unable to speak, severe coughing)      SOB 2. ONSET: "When did this breathing problem begin?"      This  Weekend harder for her to breath.   3. PATTERN "Does the difficult breathing come and go, or has it been constant since it started?"      constant 4. SEVERITY: "How bad is your breathing?" (e.g., mild, moderate, severe)    - MILD: No SOB at rest, mild SOB with walking, speaks normally in sentences, can lay down, no retractions, pulse < 100.    - MODERATE: SOB at rest, SOB with minimal exertion and prefers to sit, cannot lie down flat, speaks in phrases, mild retractions, audible wheezing, pulse 100-120.    - SEVERE: Very SOB at rest, speaks in single words, struggling to breathe, sitting hunched forward, retractions, pulse > 120      severe 5. RECURRENT SYMPTOM: "Have you had difficulty breathing before?" If so, ask: "When was the last time?" and "What happened that time?"      *No Answer* 6. CARDIAC HISTORY: "Do you have any history of heart disease?" (e.g., heart attack, angina, bypass surgery, angioplasty)      *denies 7. LUNG HISTORY: "Do you have any history of lung disease?"  (e.g., pulmonary embolus, asthma, emphysema)    denise   COPD8. CAUSE: "What do you think is causing the breathing problem?"      *No Answer* 9. OTHER SYMPTOMS: "Do you  have any other symptoms? (e.g., dizziness, runny nose, cough, chest pain, fever)     Unstable on feet coug10. PREGNANCY: "Is there any chance you are pregnant?" "When was your last menstrual period?"       *na11. TRAVEL: "Have you traveled out of the country in the last month?" (e.g., travel history, exposures)       denies  Protocols used: BREATHING DIFFICULTY-A-AH

## 2018-07-29 NOTE — Telephone Encounter (Signed)
Noted  

## 2018-07-29 NOTE — Progress Notes (Signed)
  Radiation Oncology         [phone] ________________________________  Name: Leah Howell MRN: [id number]  Date: 07/29/2018  DOB: 18-Jun-1945  SIMULATION AND TREATMENT PLANNING NOTE    ICD-10-CM   1. 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  NARRATIVE:  The patient was brought to the [facility].  Identity was confirmed.  All relevant records and images related to the planned course of therapy were reviewed.  The patient freely provided informed written consent to proceed with treatment after reviewing the details related to the planned course of therapy. The consent form was witnessed and verified by the simulation staff.  Then, the patient was set-up in a stable reproducible  supine position for radiation therapy.  CT images were obtained.  Surface markings were placed.  The CT images were loaded into the planning software.  Then the target and avoidance structures were contoured.  Treatment planning then occurred.  The radiation prescription was entered and confirmed.  Then, I designed and supervised the construction of a total of 2 medically necessary complex treatment devices.  I have requested : Intensity Modulated Radiotherapy (IMRT) is medically necessary for this case for the following reason:  Previous treatment to this area..  I have ordered:dose calc.  PLAN:  The patient will receive 35 Gy in 14 fractions.  -----------------------------------  Blair Promise, PhD, MD  This document serves as a record of services personally performed by Gery Pray, MD. It was created on his behalf by Mary-Margaret Loma Messing, a  trained medical scribe. The creation of this record is based on the scribe's personal observations and the provider's statements to them. This document has been checked and approved by the attending provider.

## 2018-07-29 NOTE — Telephone Encounter (Signed)
Received call from pt's daughter.  Pt advised today by family medicine practice to go to ED for increased SOB over the weekend.  However pt does not wish to go into the ED if she can help it, asking to see PA Sandi Mealy.  Denies fever/chills, CP, N/V/D, or changes in neuro status.  Pt's daughter says they're sitting in [facility] parking lot.  Appt note placed to scheduling, pt to see [facility] first to evaluate what else she may need.  Pt/daughter VU of appt.

## 2018-07-29 NOTE — Telephone Encounter (Signed)
During [facility] visit spoke with pt/daughter about chemo education appt today.  Pt states she doesn't wish to have the full chemo education class d/t having been through treatment once before already.  Abigail Butts for pt education made aware, advised that pt call education for some information about her new treatment regimen over the phone and that she doesn't have to attend the full class.  Pt given call back info, VU of instructions.

## 2018-07-30 ENCOUNTER — Encounter: Payer: Self-pay | Admitting: Medical

## 2018-07-30 DIAGNOSIS — Z510 Encounter for antineoplastic radiation therapy: Secondary | ICD-10-CM | POA: Diagnosis not present

## 2018-07-30 DIAGNOSIS — C3492 Malignant neoplasm of unspecified part of left bronchus or lung: Secondary | ICD-10-CM | POA: Diagnosis not present

## 2018-07-30 NOTE — Progress Notes (Signed)
Called patient to introduce myself as Arboriculturist and to offer available resources.  Discussed possible patient assistance for [place] after speaking with Rob. Advised patient what is needed to apply. Se verbalized understanding.  Discussed one-time $8 Engineer, drilling to assist with personal expenses while going through treatment.Advised what is needed to apply. Patient to bring proof of income on 08/05/18. She verbalized understanding.  Discussed [employer] which assists patients whom live in [place] only. Advised I would give her an application on 0/94/07. She was very Patent attorney.  Placed note on appointment desk for registration to advised me when she arrives. Will give my card on 08/05/18 for any additional financial questions or concerns.

## 2018-07-31 ENCOUNTER — Other Ambulatory Visit: Payer: Self-pay

## 2018-07-31 ENCOUNTER — Ambulatory Visit
Admission: RE | Admit: 2018-07-31 | Discharge: 2018-07-31 | Disposition: A | Discharge status: Home / Self Care | Payer: Medicare Other | Source: Ambulatory Visit | Attending: Radiation Oncology | Admitting: Radiation Oncology

## 2018-07-31 ENCOUNTER — Other Ambulatory Visit: Payer: Self-pay | Admitting: Medical

## 2018-07-31 ENCOUNTER — Telehealth: Payer: Self-pay | Admitting: Medical Oncology

## 2018-07-31 DIAGNOSIS — C3492 Malignant neoplasm of unspecified part of left bronchus or lung: Secondary | ICD-10-CM

## 2018-07-31 DIAGNOSIS — Z510 Encounter for antineoplastic radiation therapy: Secondary | ICD-10-CM | POA: Diagnosis not present

## 2018-07-31 DIAGNOSIS — C3411 Malignant neoplasm of upper lobe, right bronchus or lung: Secondary | ICD-10-CM

## 2018-07-31 MED ORDER — PROCHLORPERAZINE MALEATE 5 MG PO TABS: ORAL_TABLET | ORAL | 2 refills | Status: AC

## 2018-07-31 MED ORDER — SONAFINE EX EMUL
1.0000 "application " | Freq: Two times a day (BID) | CUTANEOUS | Status: DC
  Administered 2018-07-31: 1 via TOPICAL

## 2018-07-31 NOTE — Progress Notes (Signed)
Sympt[facility] Management Clinic Progress Note   Leah Howell [id number] 1945/06/10 73 y.o.  [name] is managed by Dr. Fanny Bien. Mohamed  Actively treated with chemotherapy/immunotherapy/hormonal therapy: no  Next scheduled appointment with provider:  2018/08/16  Assessment: Plan:    Chronic obstructive pulmonary disease, unspecified COPD type (Leah Howell) - Plan: methylPREDNISolone (MEDROL DOSEPAK) 4 MG TBPK tablet, predniSONE (DELTASONE) tablet 40 mg  Cancer of upper lobe of right Leah (Leah Howell) - Plan: predniSONE (DELTASONE) tablet 40 mg  Cancer related pain - Plan: gabapentin (NEURONTIN) 100 MG capsule, traMADol (ULTRAM) 50 MG tablet   COPD: Leah Howell was given prednisone 20 mg x 2 during her visit and was given a prescription for Medrol Dosepak.  Metastatic carcinoma of the right Leah: The patient is pending start of chemotherapy under the direction of Dr. Julien Nordmann.  Cancer related pain: The patient was given a refill of Ultram and was given a prescription for Neurontin 100 mg p.o. 3 times daily.  Please see After Visit Summary for patient specific instructions.  Future Appointments  Date Time Provider [place]  08/01/2018 11:45 AM [facility] LINAC 1 [facility] None  08/02/2018 12:45 PM [facility] LINAC 1 [facility] None  08/05/2018 11:10 AM [facility] LINAC 1 [facility] None  08/05/2018 11:45 AM [facility] LAB 3 [facility] None  08/05/2018 12:30 PM [facility] INFUSION [facility] None  08/06/2018 10:10 AM [facility] LINAC 1 [facility] None  08/07/2018  9:45 AM [facility] [place] FLUSH [facility] None  08/07/2018 12:45 PM [facility] LINAC 1 [facility] None  08/08/2018 10:10 AM [facility] LINAC 1 [facility] None  08/09/2018 11:45 AM [facility] LINAC 1 [facility] None  16-Aug-2018 10:00 AM [facility] LINAC 1 [facility] None  Aug 16, 2018 11:30 AM [facility] LAB 3 [facility] None  08-16-18 12:00 PM Curt Bears, MD [facility] None  07/29/2018 10:10 AM  [facility] LINAC 1 [facility] None  08/14/2018  1:15 PM [facility] LINAC 1 [facility] None  08/15/2018 11:15 AM [facility] LINAC 1 [facility] None  08/16/2018 10:10 AM [facility] LINAC 1 [facility] None  08/19/2018 10:45 AM [facility] LINAC 1 [facility] None  08/19/2018 11:00 AM [facility] LAB 1 [facility] None  08/27/2018 11:15 AM [facility] LAB 5 [facility] None  08/27/2018 11:45 AM Curt Bears, MD [facility] None  08/27/2018 12:30 PM [facility] INFUSION [facility] None  08/29/2018  9:15 AM [facility] [place] FLUSH [facility] None  08/29/2018  9:45 AM McGowen, [name], MD [facility]  09/02/2018 11:00 AM [facility] LAB 5 [facility] None  09/09/2018 11:00 AM [facility] LAB 2 [facility] None  09/16/2018  9:30 AM [facility] LAB 4 [facility] None  09/16/2018 10:00 AM Heilingoetter, Cassandra L, PA-C [facility] None  09/16/2018 11:30 AM [facility] INFUSION [facility] None  09/18/2018  9:15 AM [facility] FLUSH [facility] None  09/23/2018 11:00 AM [facility] LAB 4 [facility] None  09/30/2018 11:00 AM [facility] LAB 1 [facility] None  10/08/2018  9:00 AM [facility] LAB 5 [facility] None  10/08/2018  9:30 AM Heilingoetter, Cassandra L, PA-C [facility] None  10/08/2018 10:45 AM [facility] INFUSION [facility] None  10/10/2018  9:15 AM [facility] [place] FLUSH [facility] None    No orders of the defined types were placed in this encounter.      Subjective:   Patient ID:  Leah Howell is a 73 y.o. (DOB [birth year]) female.  Chief Complaint:  Chief Complaint  Patient presents with   Shortness of Breath    HPI ALI MCLAURIN   is a 73 year old female with a diagnosis of a stage IV (T3, N2, M1c) poorly differentiated carcinoma versus sarcoma who  presented with left upper lobe Leah mass with invasion of the mediastinum and the sternum as well as anterior chest wall in addition to left lower lobe Leah mass as well as bone metastasis in the pelvic area when she was diagnosed in  February 2020.  She additionally has a history of a stage IIIA (TX, N2, MX) non-small cell Leah cancer, squamous cell carcinoma which was diagnosed in October 2008.  She was previously treated with concurrent chemoradiation with weekly carboplatin and paclitaxel.  Her last treatment was given April 05, 2007.  This was followed by 3 cycles of consolidation chemotherapy with docetaxel which was last dose was given July 15, 2007.  She presents to the office today having last seen Dr. Julien Nordmann on 07/23/2018.  She had a brain MRI completed on 07/26/2018 which showed:  1. No acute finding or evidence of metastatic disease. 2. Small left cerebral cavernoma. Mild chronic small vessel ischemia without significant progression since 2012.  She has a history of COPD and has shortness of breath and an ongoing nonproductive cough.  She reports having fatigue and mild anorexia.  She is having swelling in her right arm and facial swelling which has increased.  She is taking tramadol for pain and requests a refill today.  Dr. Julien Nordmann had discussed beginning a Medrol Dosepak and Neurontin at her last visit.  She also asked which antiemetics will be given with her upcoming chemotherapy.  Medications: I have reviewed the patient's current medications.  Allergies: No Known Allergies  Past Medical History:  Diagnosis Date   Cancer of upper lobe of left Leah ([place]) 06/2018   Bx+ carcinoma, PET + mediastinal mets, R Leah mets, pelvic region bone mets. MRI brain pending as of 3/3/.  Pt to see Leah Howell approx 3/10 to discuss plan.   Chronic renal insufficiency, stage 2 (mild)    GFR 60s   COPD (chronic obstructive pulmonary disease) (HCC)    Hyperlipidemia    Mild--TLC   Personal history of Leah cancer 2008   2008; upper lobe R Leah (Leah Howell).  Got chemo and rad.  Has been on observation and monitoring with annual CT's of chest since 2009.  2017 was "released" by onc.  06/2018->LUL mass;-->Leah ca, +mets on  PET scan.    Past Surgical History:  Procedure Laterality Date   Leah Howell  07/04/2018   LUL mass   MEDIASTINOSCOPY  2008   for bx of Leah mass (Dr. Arlyce Dice)   TUBAL LIGATION      Family History  Problem Relation Age of Onset   Alcohol abuse Mother    Heart disease Mother    Alcohol abuse Father    Liver disease Brother     Social History   Socioeconomic History   Marital status: Married    Spouse name: Not on file   Number of children: Not on file   Years of education: Not on file   Highest education level: Not on file  Occupational History   Not on file  Social Needs   Financial resource strain: Not on file   Food insecurity:    Worry: Not on file    Inability: Not on file   Transportation needs:    Medical: Not on file    Non-medical: Not on file  Tobacco Use   Smoking status: Former Smoker    Types: Cigarettes    Last attempt to quit: 05/15/2002    Years since quitting: 16.2   Smokeless tobacco: Never Used  Substance and Sexual Activity   Alcohol use: No   Drug use: No   Sexual activity: Yes    Birth control/protection: Post-menopausal  Lifestyle   Physical activity:    Days per week: Not on file    Minutes per session: Not on file   Stress: Not on file  Relationships   Social connections:    Talks on phone: Not on file    Gets together: Not on file    Attends religious service: Not on file    Active member of club or organization: Not on file    Attends meetings of clubs or organizations: Not on file    Relationship status: Not on file   Intimate partner violence:    Fear of current or ex partner: Not on file    Emotionally abused: Not on file    Physically abused: Not on file    Forced sexual activity: Not on file  Other Topics Concern   Not on file  Social History Narrative   Married, one daughter.   HS education.  Homemaker.   Former smoker; quit 2004.   No alcohol or drugs.     Exercise: walking     Past  Medical History, Surgical history, Social history, and Family history were reviewed and updated as appropriate.   Please see review of systems for further details on the patient's review from today.   Review of Systems:  Review of Systems  Constitutional: Positive for appetite change and fatigue. Negative for chills, diaphoresis and fever.  HENT: Positive for facial swelling. Negative for trouble swallowing and voice change.   Respiratory: Positive for cough and shortness of breath. Negative for choking, chest tightness, wheezing and stridor.   Cardiovascular: Negative for chest pain and palpitations.  Gastrointestinal: Negative for abdominal pain, constipation, diarrhea, nausea and vomiting.  Musculoskeletal: Negative for back pain and myalgias.       Swelling of the right upper extremity.  Neurological: Negative for dizziness, light-headedness and headaches.    Objective:   Physical Exam:  BP 130/80 (BP Location: Left Arm, Patient Position: Sitting)    Pulse (!) 101    Temp 98 F (36.7 C) (Oral)    Resp 20    Ht 5\' 6"  (1.676 m)    Wt 115 lb (52.2 kg)    SpO2 99%    BMI 18.56 kg/m  ECOG: 1  Physical Exam Constitutional:      General: She is not in acute distress.    Appearance: She is not diaphoretic.  HENT:     Head: Atraumatic.     Comments: Mild facial swelling    Right Ear: External ear normal.     Left Ear: External ear normal.     Mouth/Throat:     Mouth: Mucous membranes are moist.     Pharynx: Oropharynx is clear. No oropharyngeal exudate.  Neck:     Musculoskeletal: Normal range of motion and neck supple.  Cardiovascular:     Rate and Rhythm: Normal rate and regular rhythm.     Heart sounds: Normal heart sounds. No murmur. No friction rub. No gallop.   Pulmonary:     Effort: Pulmonary effort is normal. No respiratory distress.     Breath sounds: Examination of the right-upper field reveals decreased breath sounds. Examination of the left-upper field reveals  decreased breath sounds. Examination of the right-middle field reveals decreased breath sounds. Examination of the left-middle field reveals decreased breath sounds. Examination of the right-lower field reveals  decreased breath sounds. Examination of the left-lower field reveals decreased breath sounds. Decreased breath sounds present. No wheezing, rhonchi or rales.  Musculoskeletal:     Comments: Mild swelling of the right upper extremity.  Lymphadenopathy:     Cervical: No cervical adenopathy.  Skin:    General: Skin is warm and dry.     Findings: No erythema or rash.  Neurological:     Mental Status: She is alert.     Coordination: Coordination normal.  Psychiatric:        Mood and Affect: Mood is not anxious.        Behavior: Behavior normal. Behavior is not agitated.        Thought Content: Thought content normal.        Judgment: Judgment normal.     Lab Review:     Component Value Date/Time   NA 133 (L) 07/23/2018 1145   NA 140 02/16/2016 0955   K 4.4 07/23/2018 1145   K 4.4 02/16/2016 0955   CL 96 (L) 07/23/2018 1145   CL 105 01/22/2012 0830   CO2 26 07/23/2018 1145   CO2 24 02/16/2016 0955   GLUCOSE 98 07/23/2018 1145   GLUCOSE 101 02/16/2016 0955   GLUCOSE 93 01/22/2012 0830   BUN 9 07/23/2018 1145   BUN 10.0 02/16/2016 0955   CREATININE 0.82 07/23/2018 1145   CREATININE 0.8 02/16/2016 0955   CALCIUM 9.8 07/23/2018 1145   CALCIUM 9.5 02/16/2016 0955   PROT 7.3 07/23/2018 1145   PROT 6.7 02/16/2016 0955   ALBUMIN 4.2 07/23/2018 1145   ALBUMIN 3.8 02/16/2016 0955   AST 23 07/23/2018 1145   AST 36 (H) 02/16/2016 0955   ALT 22 07/23/2018 1145   ALT 30 02/16/2016 0955   ALKPHOS 82 07/23/2018 1145   ALKPHOS 70 02/16/2016 0955   BILITOT 0.6 07/23/2018 1145   BILITOT 0.64 02/16/2016 0955   GFRNONAA >60 07/23/2018 1145   GFRAA >60 07/23/2018 1145       Component Value Date/Time   WBC 5.3 07/23/2018 1145   WBC 5.6 07/04/2018 1024   RBC 4.29 07/23/2018 1145     HGB 14.4 07/23/2018 1145   HGB 15.3 02/16/2016 0955   HCT 43.5 07/23/2018 1145   HCT 44.6 02/16/2016 0955   PLT 192 07/23/2018 1145   PLT 130 (L) 02/16/2016 0955   MCV 101.4 (H) 07/23/2018 1145   MCV 98.7 02/16/2016 0955   MCH 33.6 07/23/2018 1145   MCHC 33.1 07/23/2018 1145   RDW 12.8 07/23/2018 1145   RDW 14.0 02/16/2016 0955   LYMPHSABS 0.5 (L) 07/23/2018 1145   LYMPHSABS 0.5 (L) 02/16/2016 0955   MONOABS 0.5 07/23/2018 1145   MONOABS 0.4 02/16/2016 0955   EOSABS 0.1 07/23/2018 1145   EOSABS 0.2 02/16/2016 0955   BASOSABS 0.0 07/23/2018 1145   BASOSABS 0.0 02/16/2016 0955   -------------------------------  Imaging from last 24 hours (if applicable):  Radiology interpretation: Mr Jeri Cos JQ Contrast  Result Date: 07/26/2018 CLINICAL DATA:  Headache for 2 weeks.  History of Leah cancer EXAM: MRI HEAD WITHOUT AND WITH CONTRAST TECHNIQUE: Multiplanar, multiecho pulse sequences of the brain and surrounding structures were obtained without and with intravenous contrast. CONTRAST:  5 cc Gadavist intravenous COMPARISON:  07/21/2010 FINDINGS: Brain: Chronic nodular hemorrhagic focus with T1 hyperintensity and possible mild enhancement in the left corona radiata, probable cavernoma. This finding is stable from prior. No new enhancement or swelling to suggest metastatic disease. No acute or recent infarct, acute  hemorrhage, hydrocephalus, or collection. Age normal brain volume. Mild chronic small vessel ischemic change. Vascular: Major flow voids and vascular enhancements are preserved Skull and upper cervical spine: Negative for marrow lesion Sinuses/Orbits: Negative IMPRESSION: 1. No acute finding or evidence of metastatic disease. 2. Small left cerebral cavernoma. Mild chronic small vessel ischemia without significant progression since 2012. Electronically Signed   By: Monte Fantasia M.D.   On: 07/26/2018 10:04   Nm Pet Image Restag (ps) Skull Base To Thigh  Result Date:  07/12/2018 CLINICAL DATA:  Initial treatment strategy for left upper lobe Leah mass. Personal history of right Leah carcinoma. EXAM: NUCLEAR MEDICINE PET SKULL BASE TO THIGH TECHNIQUE: 5.9 mCi F-18 FDG was injected intravenously. Full-ring PET imaging was performed from the skull base to thigh after the radiotracer. CT data was obtained and used for attenuation correction and anatomic localization. Fasting blood glucose: 101 mg/dl COMPARISON:  CT on 06/18/2018 FINDINGS: (Background mediastinal blood pool activity: SUV max = 2.2) NECK:  No hypermetabolic lymph nodes or masses. Incidental CT findings:  None. CHEST: Smoothly marginated mass is seen involving the left upper lobe and left anterior chest wall at the anterior 1st intercostal space. This measures 5.0 x 3.6 cm and has SUV max of 18.4. In addition, there is abnormal soft tissue density in the anterior mediastinum and chest wall with destruction of the sternal manubrium. In aggregate, this measures 5.1 x 2.8 cm on image 57/4, and has SUV max of 14.7. Smoothly marginated hypermetabolic mass or lymphadenopathy is also seen in the left anterior cardiophrenic angle, which measures 4.2 x 3.1 cm on image 88/4, and has SUV max of 14.3. A small left pleural effusion is noted. Two pulmonary nodules are also seen in the posterior right lower lobe which are hypermetabolic, with SUV max up to 2.2. These are consistent with pulmonary metastases. Incidental CT findings: Stable postop changes seen from previous right upper lobectomy. ABDOMEN/PELVIS: No abnormal hypermetabolic activity within the liver, pancreas, adrenal glands, or spleen. No hypermetabolic lymph nodes in the abdomen or pelvis. Incidental CT findings:  None. SKELETON: Several hypermetabolic bone lesions are seen right ilium, right sacrum, and right ischial tuberosity, consistent with bone metastases. Largest metastatic lesion in the right ischial tuberosity has an SUV max of 4.7. Incidental CT findings:   None. IMPRESSION: 5 cm hypermetabolic smoothly marginated mass involving the anterior left upper lobe and left chest wall. This could represent a primary bronchogenic carcinoma or metastatic disease. Hypermetabolic anterior mediastinal and chest wall soft tissue density causing destruction of the sternal manubrium, consistent with metastatic disease. 4 cm hypermetabolic mediastinal mass in the left cardiophrenic angle, consistent with metastatic disease. Small left pleural effusion may be malignant in etiology. Two sub-cm hypermetabolic pulmonary metastases in the right lower lobe. Hypermetabolic bone metastases involving the sternum and right pelvis. Electronically Signed   By: Earle Gell M.D.   On: 07/12/2018 09:52   Ct Howell  Result Date: 07/04/2018 CLINICAL DATA:  History of right Leah carcinoma post chemotherapy and radiation. New anterior left pleural based thoracic mass. EXAM: CT GUIDED CORE Howell OF LEFT PLEURAL MASS ANESTHESIA/SEDATION: Intravenous Fentanyl and Versed were administered as conscious sedation during continuous monitoring of the patient's level of consciousness and physiological / cardiorespiratory status by the radiology RN, with a total moderate sedation time of 16 minutes. PROCEDURE: The procedure risks, benefits, and alternatives were explained to the patient. Questions regarding the procedure were encouraged and answered. The patient understands and consents to the procedure. Select  axial scans through the thorax were obtained. The left anterior pleural/body wall mass was localized and an appropriate skin entry site was determined and marked. The operative field was prepped with chlorhexidinein a sterile fashion, and a sterile drape was applied covering the operative field. A sterile gown and sterile gloves were used for the procedure. Local anesthesia was provided with 1% Lidocaine. Under CT fluoroscopic guidance, a 17 gauge trocar needle was advanced to the margin of the lesion.  Once needle tip position was confirmed, coaxial 18-gauge core Howell samples were obtained, submitted in formalin to surgical pathology. The guide needle was removed. Postprocedure scans show no hemorrhage, pneumothorax, or other apparent complication The patient tolerated the procedure well. COMPLICATIONS: None immediate FINDINGS: Anterior left pleural/chest wall mass was localized. Representative core Howell samples obtained as above. IMPRESSION: 1. Technically successful CT-guided core Howell, left anterior pleural based mass. Electronically Signed   By: Lucrezia Europe M.D.   On: 07/04/2018 14:22        This case was discussed with Dr. Julien Nordmann. He expressed agreement with my management of this patient.

## 2018-07-31 NOTE — Telephone Encounter (Signed)
Insufficient tissue for molecular testing.

## 2018-07-31 NOTE — Progress Notes (Signed)
  Radiation Oncology         [phone] ________________________________  Name: Leah Howell MRN: [id number]  Date: 07/31/2018  DOB: 1945/06/01  Simulation Verification Note    ICD-10-CM   1. Cancer of upper lobe of right lung ([place]) C34.11 Sonafine emulsion 1 application    Status: outpatient  NARRATIVE: The patient was brought to the treatment unit and placed in the planned treatment position. The clinical setup was verified. Then port films were obtained and uploaded to the radiation oncology medical record software.  The treatment beams were carefully compared against the planned radiation fields. The position location and shape of the radiation fields was reviewed. They targeted volume of tissue appears to be appropriately covered by the radiation beams. Organs at risk appear to be excluded as planned.  Based on my personal review, I approved the simulation verification. The patient's treatment will proceed as planned.  -----------------------------------  Blair Promise, PhD, MD This document serves as a record of services personally performed by Gery Pray, MD. It was created on his behalf by Mary-Margaret Loma Messing, a trained medical scribe. The creation of this record is based on the scribe's personal observations and the provider's statements to them. This document has been checked and approved by the attending provider.

## 2018-08-01 ENCOUNTER — Other Ambulatory Visit: Payer: Self-pay

## 2018-08-01 ENCOUNTER — Ambulatory Visit
Admission: RE | Admit: 2018-08-01 | Discharge: 2018-08-01 | Disposition: A | Discharge status: Home / Self Care | Payer: Medicare Other | Source: Ambulatory Visit | Attending: Radiation Oncology | Admitting: Radiation Oncology

## 2018-08-01 DIAGNOSIS — C3492 Malignant neoplasm of unspecified part of left bronchus or lung: Secondary | ICD-10-CM | POA: Diagnosis not present

## 2018-08-01 DIAGNOSIS — Z510 Encounter for antineoplastic radiation therapy: Secondary | ICD-10-CM | POA: Diagnosis not present

## 2018-08-02 ENCOUNTER — Ambulatory Visit
Admission: RE | Admit: 2018-08-02 | Discharge: 2018-08-02 | Disposition: A | Discharge status: Home / Self Care | Payer: Medicare Other | Source: Ambulatory Visit | Attending: Radiation Oncology | Admitting: Radiation Oncology

## 2018-08-02 ENCOUNTER — Other Ambulatory Visit: Payer: Self-pay

## 2018-08-02 DIAGNOSIS — C3492 Malignant neoplasm of unspecified part of left bronchus or lung: Secondary | ICD-10-CM | POA: Diagnosis not present

## 2018-08-02 DIAGNOSIS — Z510 Encounter for antineoplastic radiation therapy: Secondary | ICD-10-CM | POA: Diagnosis not present

## 2018-08-05 ENCOUNTER — Encounter: Payer: Self-pay | Admitting: Internal Medicine

## 2018-08-05 ENCOUNTER — Other Ambulatory Visit: Payer: Self-pay | Admitting: Internal Medicine

## 2018-08-05 ENCOUNTER — Inpatient Hospital Stay: Payer: Medicare Other

## 2018-08-05 ENCOUNTER — Other Ambulatory Visit: Payer: Self-pay

## 2018-08-05 ENCOUNTER — Ambulatory Visit
Admission: RE | Admit: 2018-08-05 | Discharge: 2018-08-05 | Disposition: A | Discharge status: Home / Self Care | Payer: Medicare Other | Source: Ambulatory Visit | Attending: Radiation Oncology | Admitting: Radiation Oncology

## 2018-08-05 VITALS — BP 148/90 | HR 97 | Temp 97.6°F | Resp 18 | Wt 116.5 lb

## 2018-08-05 DIAGNOSIS — C3492 Malignant neoplasm of unspecified part of left bronchus or lung: Secondary | ICD-10-CM | POA: Diagnosis not present

## 2018-08-05 DIAGNOSIS — C3411 Malignant neoplasm of upper lobe, right bronchus or lung: Secondary | ICD-10-CM

## 2018-08-05 DIAGNOSIS — J9621 Acute and chronic respiratory failure with hypoxia: Secondary | ICD-10-CM | POA: Diagnosis not present

## 2018-08-05 DIAGNOSIS — J90 Pleural effusion, not elsewhere classified: Secondary | ICD-10-CM | POA: Diagnosis not present

## 2018-08-05 DIAGNOSIS — I82890 Acute embolism and thrombosis of other specified veins: Secondary | ICD-10-CM | POA: Diagnosis not present

## 2018-08-05 DIAGNOSIS — C3412 Malignant neoplasm of upper lobe, left bronchus or lung: Secondary | ICD-10-CM | POA: Diagnosis not present

## 2018-08-05 DIAGNOSIS — J810 Acute pulmonary edema: Secondary | ICD-10-CM | POA: Diagnosis not present

## 2018-08-05 DIAGNOSIS — E441 Mild protein-calorie malnutrition: Secondary | ICD-10-CM | POA: Diagnosis not present

## 2018-08-05 DIAGNOSIS — R0602 Shortness of breath: Secondary | ICD-10-CM | POA: Diagnosis not present

## 2018-08-05 DIAGNOSIS — M25512 Pain in left shoulder: Secondary | ICD-10-CM | POA: Diagnosis not present

## 2018-08-05 DIAGNOSIS — I82A19 Acute embolism and thrombosis of unspecified axillary vein: Secondary | ICD-10-CM | POA: Diagnosis not present

## 2018-08-05 DIAGNOSIS — I313 Pericardial effusion (noninflammatory): Secondary | ICD-10-CM | POA: Diagnosis not present

## 2018-08-05 LAB — CBC WITH DIFFERENTIAL (CANCER CENTER ONLY)
Abs Immature Granulocytes: 0.02 10*3/uL (ref 0.00–0.07)
BASOS PCT: 0 %
Basophils Absolute: 0 10*3/uL (ref 0.0–0.1)
Eosinophils Absolute: 0.1 10*3/uL (ref 0.0–0.5)
Eosinophils Relative: 1 %
HCT: 42.4 % (ref 36.0–46.0)
Hemoglobin: 14 g/dL (ref 12.0–15.0)
Immature Granulocytes: 0 %
Lymphocytes Relative: 5 %
Lymphs Abs: 0.3 10*3/uL — ABNORMAL LOW (ref 0.7–4.0)
MCH: 33.2 pg (ref 26.0–34.0)
MCHC: 33 g/dL (ref 30.0–36.0)
MCV: 100.5 fL — ABNORMAL HIGH (ref 80.0–100.0)
Monocytes Absolute: 0.6 10*3/uL (ref 0.1–1.0)
Monocytes Relative: 11 %
Neutro Abs: 4.2 10*3/uL (ref 1.7–7.7)
Neutrophils Relative %: 83 %
Platelet Count: 194 10*3/uL (ref 150–400)
RBC: 4.22 MIL/uL (ref 3.87–5.11)
RDW: 12.7 % (ref 11.5–15.5)
WBC Count: 5.2 10*3/uL (ref 4.0–10.5)
nRBC: 0 % (ref 0.0–0.2)

## 2018-08-05 LAB — CMP (CANCER CENTER ONLY)
ALT: 20 U/L (ref 0–44)
AST: 21 U/L (ref 15–41)
Albumin: 3.8 g/dL (ref 3.5–5.0)
Alkaline Phosphatase: 91 U/L (ref 38–126)
Anion gap: 15 (ref 5–15)
BUN: 9 mg/dL (ref 8–23)
CO2: 21 mmol/L — ABNORMAL LOW (ref 22–32)
Calcium: 9.3 mg/dL (ref 8.9–10.3)
Chloride: 98 mmol/L (ref 98–111)
Creatinine: 0.75 mg/dL (ref 0.44–1.00)
GFR, Est AFR Am: >60 mL/min (ref >60)
GFR, Estimated: >60 mL/min (ref >60)
Glucose, Bld: 96 mg/dL (ref 70–99)
Potassium: 3.9 mmol/L (ref 3.5–5.1)
Sodium: 134 mmol/L — ABNORMAL LOW (ref 135–145)
TOTAL PROTEIN: 6.9 g/dL (ref 6.5–8.1)
Total Bilirubin: 0.8 mg/dL (ref 0.3–1.2)

## 2018-08-05 MED ORDER — DIPHENHYDRAMINE HCL 50 MG/ML IJ SOLN
50.0000 mg | Freq: Once | INTRAMUSCULAR | Status: AC
  Administered 2018-08-05: 50 mg via INTRAVENOUS

## 2018-08-05 MED ORDER — PALONOSETRON HCL INJECTION 0.25 MG/5ML
0.2500 mg | Freq: Once | INTRAVENOUS | Status: AC
  Administered 2018-08-05: 0.25 mg via INTRAVENOUS

## 2018-08-05 MED ORDER — SODIUM CHLORIDE 0.9 % IV SOLN
Freq: Once | INTRAVENOUS | Status: AC
  Administered 2018-08-05: 13:00:00 via INTRAVENOUS
  Filled 2018-08-05: qty 250

## 2018-08-05 MED ORDER — SODIUM CHLORIDE 0.9 % IV SOLN
200.0000 mg | Freq: Once | INTRAVENOUS | Status: AC
  Administered 2018-08-05: 200 mg via INTRAVENOUS
  Filled 2018-08-05: qty 8

## 2018-08-05 MED ORDER — FAMOTIDINE IN NACL 20-0.9 MG/50ML-% IV SOLN: 20.0000 mg | Freq: Once | INTRAVENOUS | Status: DC

## 2018-08-05 MED ORDER — DIPHENHYDRAMINE HCL 50 MG/ML IJ SOLN
INTRAMUSCULAR | Status: AC
  Filled 2018-08-05: qty 1

## 2018-08-05 MED ORDER — SODIUM CHLORIDE 0.9 % IV SOLN
20.0000 mg | Freq: Once | INTRAVENOUS | Status: AC
  Administered 2018-08-05: 20 mg via INTRAVENOUS
  Filled 2018-08-05: qty 2

## 2018-08-05 MED ORDER — PALONOSETRON HCL INJECTION 0.25 MG/5ML
INTRAVENOUS | Status: AC
  Filled 2018-08-05: qty 5

## 2018-08-05 MED ORDER — SODIUM CHLORIDE 0.9 % IV SOLN
175.0000 mg/m2 | Freq: Once | INTRAVENOUS | Status: AC
  Administered 2018-08-05: 270 mg via INTRAVENOUS
  Filled 2018-08-05: qty 45

## 2018-08-05 MED ORDER — SODIUM CHLORIDE 0.9 % IV SOLN
Freq: Once | INTRAVENOUS | Status: AC
  Administered 2018-08-05: 14:00:00 via INTRAVENOUS
  Filled 2018-08-05: qty 5

## 2018-08-05 MED ORDER — SODIUM CHLORIDE 0.9 % IV SOLN
340.0000 mg | Freq: Once | INTRAVENOUS | Status: AC
  Administered 2018-08-05: 340 mg via INTRAVENOUS
  Filled 2018-08-05: qty 34

## 2018-08-05 NOTE — Progress Notes (Signed)
Decrease Taxol dose to 175mg /m2 per MD.   Hardie Pulley, PharmD, BCPS, BCOP

## 2018-08-05 NOTE — Patient Instructions (Signed)
[facility] Discharge Instructions for Patients Receiving Chemotherapy  Today you received the following chemotherapy agents Pembrolizumab (KEYTRUDA), Paclitaxel (TAXOL) & Carboplatin (PARAPLATIN).  To help prevent nausea and vomiting after your treatment, we encourage you to take your nausea medication as prescribed.  If you develop nausea and vomiting that is not controlled by your nausea medication, call the clinic.   BELOW ARE SYMPTOMS THAT SHOULD BE REPORTED IMMEDIATELY:  *FEVER GREATER THAN 100.5 F  *CHILLS WITH OR WITHOUT FEVER  NAUSEA AND VOMITING THAT IS NOT CONTROLLED WITH YOUR NAUSEA MEDICATION  *UNUSUAL SHORTNESS OF BREATH  *UNUSUAL BRUISING OR BLEEDING  TENDERNESS IN MOUTH AND THROAT WITH OR WITHOUT PRESENCE OF ULCERS  *URINARY PROBLEMS  *BOWEL PROBLEMS  UNUSUAL RASH Items with * indicate a potential emergency and should be followed up as soon as possible.  Feel free to call the clinic should you have any questions or concerns. The clinic phone number is [phone].  Please show the [place] at check-in to the Emergency Department and triage nurse.  Pembrolizumab injection What is this medicine? PEMBROLIZUMAB (pem broe liz ue mab) is a monoclonal antibody. It is used to treat cervical cancer, esophageal cancer, head and neck cancer, hepatocellular cancer, Hodgkin lymphoma, kidney cancer, lymphoma, melanoma, Merkel cell carcinoma, lung cancer, stomach cancer, urothelial cancer, and cancers that have a certain genetic condition. This medicine may be used for other purposes; ask your health care provider or pharmacist if you have questions. COMMON BRAND NAME(S): Keytruda What should I tell my health care provider before I take this medicine? They need to know if you have any of these conditions: -diabetes -immune system problems -inflammatory bowel disease -liver disease -lung or breathing disease -lupus -received or scheduled to  receive an organ transplant or a stem-cell transplant that uses donor stem cells -an unusual or allergic reaction to pembrolizumab, other medicines, foods, dyes, or preservatives -pregnant or trying to get pregnant -breast-feeding How should I use this medicine? This medicine is for infusion into a vein. It is given by a health care professional in a hospital or clinic setting. A special MedGuide will be given to you before each treatment. Be sure to read this information carefully each time. Talk to your pediatrician regarding the use of this medicine in children. While this drug may be prescribed for selected conditions, precautions do apply. Overdosage: If you think you have taken too much of this medicine contact a poison control center or emergency room at once. NOTE: This medicine is only for you. Do not share this medicine with others. What if I miss a dose? It is important not to miss your dose. Call your doctor or health care professional if you are unable to keep an appointment. What may interact with this medicine? Interactions have not been studied. Give your health care provider a list of all the medicines, herbs, non-prescription drugs, or dietary supplements you use. Also tell them if you smoke, drink alcohol, or use illegal drugs. Some items may interact with your medicine. This list may not describe all possible interactions. Give your health care provider a list of all the medicines, herbs, non-prescription drugs, or dietary supplements you use. Also tell them if you smoke, drink alcohol, or use illegal drugs. Some items may interact with your medicine. What should I watch for while using this medicine? Your condition will be monitored carefully while you are receiving this medicine. You may need blood work done while you are taking this medicine.  Do not become pregnant while taking this medicine or for 4 months after stopping it. Women should inform their doctor if they wish to  become pregnant or think they might be pregnant. There is a potential for serious side effects to an unborn child. Talk to your health care professional or pharmacist for more information. Do not breast-feed an infant while taking this medicine or for 4 months after the last dose. What side effects may I notice from receiving this medicine? Side effects that you should report to your doctor or health care professional as soon as possible: -allergic reactions like skin rash, itching or hives, swelling of the face, lips, or tongue -bloody or black, tarry -breathing problems -changes in vision -chest pain -chills -confusion -constipation -cough -diarrhea -dizziness or feeling faint or lightheaded -fast or irregular heartbeat -fever -flushing -hair loss -joint pain -low blood counts - this medicine may decrease the number of white blood cells, red blood cells and platelets. You may be at increased risk for infections and bleeding. -muscle pain -muscle weakness -persistent headache -redness, blistering, peeling or loosening of the skin, including inside the mouth -signs and symptoms of high blood sugar such as dizziness; dry mouth; dry skin; fruity breath; nausea; stomach pain; increased hunger or thirst; increased urination -signs and symptoms of kidney injury like trouble passing urine or change in the amount of urine -signs and symptoms of liver injury like dark urine, light-colored stools, loss of appetite, nausea, right upper belly pain, yellowing of the eyes or skin -sweating -swollen lymph nodes -weight loss Side effects that usually do not require medical attention (report to your doctor or health care professional if they continue or are bothersome): -decreased appetite -muscle pain -tiredness This list may not describe all possible side effects. Call your doctor for medical advice about side effects. You may report side effects to FDA at 1-800-FDA-1088. Where should I keep my  medicine? This drug is given in a hospital or clinic and will not be stored at home. NOTE: This sheet is a summary. It may not cover all possible information. If you have questions about this medicine, talk to your doctor, pharmacist, or health care provider.  2019 Elsevier/Gold Standard (2017-12-13 15:06:10)  Paclitaxel injection What is this medicine? PACLITAXEL (PAK li TAX el) is a chemotherapy drug. It targets fast dividing cells, like cancer cells, and causes these cells to die. This medicine is used to treat ovarian cancer, breast cancer, lung cancer, Kaposi's sarcoma, and other cancers. This medicine may be used for other purposes; ask your health care provider or pharmacist if you have questions. COMMON BRAND NAME(S): Onxol, Taxol What should I tell my health care provider before I take this medicine? They need to know if you have any of these conditions: -history of irregular heartbeat -liver disease -low blood counts, like low white cell, platelet, or red cell counts -lung or breathing disease, like asthma -tingling of the fingers or toes, or other nerve disorder -an unusual or allergic reaction to paclitaxel, alcohol, polyoxyethylated castor oil, other chemotherapy, other medicines, foods, dyes, or preservatives -pregnant or trying to get pregnant -breast-feeding How should I use this medicine? This drug is given as an infusion into a vein. It is administered in a hospital or clinic by a specially trained health care professional. Talk to your pediatrician regarding the use of this medicine in children. Special care may be needed. Overdosage: If you think you have taken too much of this medicine contact a poison control center  or emergency room at once. NOTE: This medicine is only for you. Do not share this medicine with others. What if I miss a dose? It is important not to miss your dose. Call your doctor or health care professional if you are unable to keep an  appointment. What may interact with this medicine? Do not take this medicine with any of the following medications: -disulfiram -metronidazole This medicine may also interact with the following medications: -antiviral medicines for hepatitis, HIV or AIDS -certain antibiotics like erythromycin and clarithromycin -certain medicines for fungal infections like ketoconazole and itraconazole -certain medicines for seizures like carbamazepine, phenobarbital, phenytoin -gemfibrozil -nefazodone -rifampin -St. John's wort This list may not describe all possible interactions. Give your health care provider a list of all the medicines, herbs, non-prescription drugs, or dietary supplements you use. Also tell them if you smoke, drink alcohol, or use illegal drugs. Some items may interact with your medicine. What should I watch for while using this medicine? Your condition will be monitored carefully while you are receiving this medicine. You will need important blood work done while you are taking this medicine. This medicine can cause serious allergic reactions. To reduce your risk you will need to take other medicine(s) before treatment with this medicine. If you experience allergic reactions like skin rash, itching or hives, swelling of the face, lips, or tongue, tell your doctor or health care professional right away. In some cases, you may be given additional medicines to help with side effects. Follow all directions for their use. This drug may make you feel generally unwell. This is not uncommon, as chemotherapy can affect healthy cells as well as cancer cells. Report any side effects. Continue your course of treatment even though you feel ill unless your doctor tells you to stop. Call your doctor or health care professional for advice if you get a fever, chills or sore throat, or other symptoms of a cold or flu. Do not treat yourself. This drug decreases your body's ability to fight infections. Try to  avoid being around people who are sick. This medicine may increase your risk to bruise or bleed. Call your doctor or health care professional if you notice any unusual bleeding. Be careful brushing and flossing your teeth or using a toothpick because you may get an infection or bleed more easily. If you have any dental work done, tell your dentist you are receiving this medicine. Avoid taking products that contain aspirin, acetaminophen, ibuprofen, naproxen, or ketoprofen unless instructed by your doctor. These medicines may hide a fever. Do not become pregnant while taking this medicine. Women should inform their doctor if they wish to become pregnant or think they might be pregnant. There is a potential for serious side effects to an unborn child. Talk to your health care professional or pharmacist for more information. Do not breast-feed an infant while taking this medicine. Men are advised not to father a child while receiving this medicine. This product may contain alcohol. Ask your pharmacist or healthcare provider if this medicine contains alcohol. Be sure to tell all healthcare providers you are taking this medicine. Certain medicines, like metronidazole and disulfiram, can cause an unpleasant reaction when taken with alcohol. The reaction includes flushing, headache, nausea, vomiting, sweating, and increased thirst. The reaction can last from 30 minutes to several hours. What side effects may I notice from receiving this medicine? Side effects that you should report to your doctor or health care professional as soon as possible: -allergic reactions like  skin rash, itching or hives, swelling of the face, lips, or tongue -breathing problems -changes in vision -fast, irregular heartbeat -high or low blood pressure -mouth sores -pain, tingling, numbness in the hands or feet -signs of decreased platelets or bleeding - bruising, pinpoint red spots on the skin, black, tarry stools, blood in the  urine -signs of decreased red blood cells - unusually weak or tired, feeling faint or lightheaded, falls -signs of infection - fever or chills, cough, sore throat, pain or difficulty passing urine -signs and symptoms of liver injury like dark yellow or brown urine; general ill feeling or flu-like symptoms; light-colored stools; loss of appetite; nausea; right upper belly pain; unusually weak or tired; yellowing of the eyes or skin -swelling of the ankles, feet, hands -unusually slow heartbeat Side effects that usually do not require medical attention (report to your doctor or health care professional if they continue or are bothersome): -diarrhea -hair loss -loss of appetite -muscle or joint pain -nausea, vomiting -pain, redness, or irritation at site where injected -tiredness This list may not describe all possible side effects. Call your doctor for medical advice about side effects. You may report side effects to FDA at 1-800-FDA-1088. Where should I keep my medicine? This drug is given in a hospital or clinic and will not be stored at home. NOTE: This sheet is a summary. It may not cover all possible information. If you have questions about this medicine, talk to your doctor, pharmacist, or health care provider.  2019 Elsevier/Gold Standard (2017-01-02 13:14:55)  Carboplatin injection What is this medicine? CARBOPLATIN (KAR boe pla tin) is a chemotherapy drug. It targets fast dividing cells, like cancer cells, and causes these cells to die. This medicine is used to treat ovarian cancer and many other cancers. This medicine may be used for other purposes; ask your health care provider or pharmacist if you have questions. COMMON BRAND NAME(S): Paraplatin What should I tell my health care provider before I take this medicine? They need to know if you have any of these conditions: -blood disorders -hearing problems -kidney disease -recent or ongoing radiation therapy -an unusual or  allergic reaction to carboplatin, cisplatin, other chemotherapy, other medicines, foods, dyes, or preservatives -pregnant or trying to get pregnant -breast-feeding How should I use this medicine? This drug is usually given as an infusion into a vein. It is administered in a hospital or clinic by a specially trained health care professional. Talk to your pediatrician regarding the use of this medicine in children. Special care may be needed. Overdosage: If you think you have taken too much of this medicine contact a poison control center or emergency room at once. NOTE: This medicine is only for you. Do not share this medicine with others. What if I miss a dose? It is important not to miss a dose. Call your doctor or health care professional if you are unable to keep an appointment. What may interact with this medicine? -medicines for seizures -medicines to increase blood counts like filgrastim, pegfilgrastim, sargramostim -some antibiotics like amikacin, gentamicin, neomycin, streptomycin, tobramycin -vaccines Talk to your doctor or health care professional before taking any of these medicines: -acetaminophen -aspirin -ibuprofen -ketoprofen -naproxen This list may not describe all possible interactions. Give your health care provider a list of all the medicines, herbs, non-prescription drugs, or dietary supplements you use. Also tell them if you smoke, drink alcohol, or use illegal drugs. Some items may interact with your medicine. What should I watch for while using  this medicine? Your condition will be monitored carefully while you are receiving this medicine. You will need important blood work done while you are taking this medicine. This drug may make you feel generally unwell. This is not uncommon, as chemotherapy can affect healthy cells as well as cancer cells. Report any side effects. Continue your course of treatment even though you feel ill unless your doctor tells you to stop. In  some cases, you may be given additional medicines to help with side effects. Follow all directions for their use. Call your doctor or health care professional for advice if you get a fever, chills or sore throat, or other symptoms of a cold or flu. Do not treat yourself. This drug decreases your body's ability to fight infections. Try to avoid being around people who are sick. This medicine may increase your risk to bruise or bleed. Call your doctor or health care professional if you notice any unusual bleeding. Be careful brushing and flossing your teeth or using a toothpick because you may get an infection or bleed more easily. If you have any dental work done, tell your dentist you are receiving this medicine. Avoid taking products that contain aspirin, acetaminophen, ibuprofen, naproxen, or ketoprofen unless instructed by your doctor. These medicines may hide a fever. Do not become pregnant while taking this medicine. Women should inform their doctor if they wish to become pregnant or think they might be pregnant. There is a potential for serious side effects to an unborn child. Talk to your health care professional or pharmacist for more information. Do not breast-feed an infant while taking this medicine. What side effects may I notice from receiving this medicine? Side effects that you should report to your doctor or health care professional as soon as possible: -allergic reactions like skin rash, itching or hives, swelling of the face, lips, or tongue -signs of infection - fever or chills, cough, sore throat, pain or difficulty passing urine -signs of decreased platelets or bleeding - bruising, pinpoint red spots on the skin, black, tarry stools, nosebleeds -signs of decreased red blood cells - unusually weak or tired, fainting spells, lightheadedness -breathing problems -changes in hearing -changes in vision -chest pain -high blood pressure -low blood counts - This drug may decrease the  number of white blood cells, red blood cells and platelets. You may be at increased risk for infections and bleeding. -nausea and vomiting -pain, swelling, redness or irritation at the injection site -pain, tingling, numbness in the hands or feet -problems with balance, talking, walking -trouble passing urine or change in the amount of urine Side effects that usually do not require medical attention (report to your doctor or health care professional if they continue or are bothersome): -hair loss -loss of appetite -metallic taste in the mouth or changes in taste This list may not describe all possible side effects. Call your doctor for medical advice about side effects. You may report side effects to FDA at 1-800-FDA-1088. Where should I keep my medicine? This drug is given in a hospital or clinic and will not be stored at home. NOTE: This sheet is a summary. It may not cover all possible information. If you have questions about this medicine, talk to your doctor, pharmacist, or health care provider.  2019 Elsevier/Gold Standard (2007-08-06 14:38:05)  Coronavirus (COVID-19) Are you at risk?  Are you at risk for the Coronavirus (COVID-19)?  To be considered HIGH RISK for Coronavirus (COVID-19), you have to meet the following criteria:  .  Traveled to [place], [place], [place], [place] or [place]; or in the [place] to [place], [place], [place], or [place]; and have fever, cough, and shortness of breath within the last 2 weeks of travel OR . Been in close contact with a person diagnosed with COVID-19 within the last 2 weeks and have fever, cough, and shortness of breath . IF YOU DO NOT MEET THESE CRITERIA, YOU ARE CONSIDERED LOW RISK FOR COVID-19.  What to do if you are HIGH RISK for COVID-19?  Marland Kitchen If you are having a medical emergency, call 911. . Seek medical care right away. Before you go to a doctor's office, urgent care or emergency department, call ahead and tell them about  your recent travel, contact with someone diagnosed with COVID-19, and your symptoms. You should receive instructions from your physician's office regarding next steps of care.  . When you arrive at healthcare provider, tell the healthcare staff immediately you have returned from visiting [place], [place], [place], [place] or [place]; or traveled in the [place] to [place], [place], [place], or [place]; in the last two weeks or you have been in close contact with a person diagnosed with COVID-19 in the last 2 weeks.   . Tell the health care staff about your symptoms: fever, cough and shortness of breath. . After you have been seen by a medical provider, you will be either: o Tested for (COVID-19) and discharged home on quarantine except to seek medical care if symptoms worsen, and asked to  - Stay home and avoid contact with others until you get your results (4-5 days)  - Avoid travel on public transportation if possible (such as bus, train, or airplane) or o Sent to the Emergency Department by EMS for evaluation, COVID-19 testing, and possible admission depending on your condition and test results.  What to do if you are LOW RISK for COVID-19?  Reduce your risk of any infection by using the same precautions used for avoiding the common cold or flu:  Marland Kitchen Wash your hands often with soap and warm water for at least 20 seconds.  If soap and water are not readily available, use an alcohol-based hand sanitizer with at least 60% alcohol.  . If coughing or sneezing, cover your mouth and nose by coughing or sneezing into the elbow areas of your shirt or coat, into a tissue or into your sleeve (not your hands). . Avoid shaking hands with others and consider head nods or verbal greetings only. . Avoid touching your eyes, nose, or mouth with unwashed hands.  . Avoid close contact with people who are sick. . Avoid places or events with large numbers of people in one location, like concerts or sporting  events. . Carefully consider travel plans you have or are making. . If you are planning any travel outside or inside the [place], visit the CDC's Travelers' Health webpage for the latest health notices. . If you have some symptoms but not all symptoms, continue to monitor at home and seek medical attention if your symptoms worsen. . If you are having a medical emergency, call 911.   [facility] / e-Visit: [email or web address]         [facility]: [facility]: [phone]                   [facility]: [phone]

## 2018-08-05 NOTE — Progress Notes (Signed)
Proof of patient and spouse income were dropped off to me earlier for the [place].  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  Patient was approved for the one-time $700 [place] to assist with personal expenses while going through treatment. Gave her a copy of the approval letter as well as the expense sheet along with the Outpatient pharmacy information. Gave a [employer] application as well and my card.  Called patient's daughter [name] to explain everything that I discussed with the patient. She verbalized understanding and has my card for any additional financial questions or concerns.

## 2018-08-05 NOTE — Progress Notes (Signed)
Went back and offered patient gas card and she accepted.

## 2018-08-06 ENCOUNTER — Telehealth: Payer: Self-pay | Admitting: Medical Oncology

## 2018-08-06 ENCOUNTER — Encounter: Payer: Self-pay | Admitting: Pharmacy Technician

## 2018-08-06 ENCOUNTER — Ambulatory Visit
Admission: RE | Admit: 2018-08-06 | Discharge: 2018-08-06 | Disposition: A | Discharge status: Home / Self Care | Payer: Medicare Other | Source: Ambulatory Visit | Attending: Radiation Oncology | Admitting: Radiation Oncology

## 2018-08-06 ENCOUNTER — Other Ambulatory Visit: Payer: Self-pay

## 2018-08-06 DIAGNOSIS — C3492 Malignant neoplasm of unspecified part of left bronchus or lung: Secondary | ICD-10-CM | POA: Diagnosis not present

## 2018-08-06 NOTE — Progress Notes (Signed)
The patient is approved for drug assistance by [employer] for [employer]. Enrollment is effective until 08/05/19 and is based on insurance denial. Drug replacement will begin on DOS 08/07/18.

## 2018-08-06 NOTE — Telephone Encounter (Signed)
Chemo follow-up - States she is "doing okay" . She is on her way home from xrt.  She is eating and trying to drink extra fluids. Her left eye is "blood red" , no pain or vision problems. I told her to monitor for now and call if it gets worse.

## 2018-08-07 ENCOUNTER — Inpatient Hospital Stay: Payer: Medicare Other

## 2018-08-07 ENCOUNTER — Inpatient Hospital Stay (HOSPITAL_COMMUNITY): Payer: Medicare Other

## 2018-08-07 ENCOUNTER — Emergency Department (HOSPITAL_BASED_OUTPATIENT_CLINIC_OR_DEPARTMENT_OTHER): Payer: Medicare Other

## 2018-08-07 ENCOUNTER — Emergency Department (HOSPITAL_COMMUNITY): Payer: Medicare Other

## 2018-08-07 ENCOUNTER — Other Ambulatory Visit: Payer: Self-pay

## 2018-08-07 ENCOUNTER — Ambulatory Visit: Payer: Medicare Other

## 2018-08-07 ENCOUNTER — Other Ambulatory Visit (HOSPITAL_COMMUNITY): Payer: Self-pay

## 2018-08-07 ENCOUNTER — Telehealth: Payer: Self-pay | Admitting: Medical Oncology

## 2018-08-07 ENCOUNTER — Telehealth: Payer: Self-pay | Admitting: Emergency Medicine

## 2018-08-07 ENCOUNTER — Encounter (HOSPITAL_COMMUNITY): Payer: Self-pay | Admitting: Obstetrics and Gynecology

## 2018-08-07 ENCOUNTER — Inpatient Hospital Stay (HOSPITAL_COMMUNITY)
Admission: EM | Admit: 2018-08-07 | Discharge: 2018-08-12 | DRG: 270 | Disposition: A | Discharge status: Hospice | Payer: Medicare Other | Attending: Internal Medicine | Admitting: Internal Medicine

## 2018-08-07 DIAGNOSIS — Z743 Need for continuous supervision: Secondary | ICD-10-CM | POA: Diagnosis not present

## 2018-08-07 DIAGNOSIS — K5903 Drug induced constipation: Secondary | ICD-10-CM | POA: Diagnosis present

## 2018-08-07 DIAGNOSIS — J90 Pleural effusion, not elsewhere classified: Secondary | ICD-10-CM | POA: Diagnosis not present

## 2018-08-07 DIAGNOSIS — Z8249 Family history of ischemic heart disease and other diseases of the circulatory system: Secondary | ICD-10-CM

## 2018-08-07 DIAGNOSIS — Z66 Do not resuscitate: Secondary | ICD-10-CM | POA: Diagnosis present

## 2018-08-07 DIAGNOSIS — Z452 Encounter for adjustment and management of vascular access device: Secondary | ICD-10-CM | POA: Diagnosis not present

## 2018-08-07 DIAGNOSIS — R591 Generalized enlarged lymph nodes: Secondary | ICD-10-CM | POA: Diagnosis present

## 2018-08-07 DIAGNOSIS — I82621 Acute embolism and thrombosis of deep veins of right upper extremity: Secondary | ICD-10-CM | POA: Diagnosis not present

## 2018-08-07 DIAGNOSIS — M25512 Pain in left shoulder: Secondary | ICD-10-CM | POA: Diagnosis not present

## 2018-08-07 DIAGNOSIS — R000 Tachycardia, unspecified: Secondary | ICD-10-CM | POA: Diagnosis present

## 2018-08-07 DIAGNOSIS — R279 Unspecified lack of coordination: Secondary | ICD-10-CM | POA: Diagnosis not present

## 2018-08-07 DIAGNOSIS — I309 Acute pericarditis, unspecified: Secondary | ICD-10-CM | POA: Diagnosis not present

## 2018-08-07 DIAGNOSIS — C3490 Malignant neoplasm of unspecified part of unspecified bronchus or lung: Secondary | ICD-10-CM | POA: Diagnosis not present

## 2018-08-07 DIAGNOSIS — R0789 Other chest pain: Secondary | ICD-10-CM | POA: Diagnosis present

## 2018-08-07 DIAGNOSIS — E441 Mild protein-calorie malnutrition: Secondary | ICD-10-CM | POA: Diagnosis not present

## 2018-08-07 DIAGNOSIS — C3492 Malignant neoplasm of unspecified part of left bronchus or lung: Secondary | ICD-10-CM | POA: Diagnosis present

## 2018-08-07 DIAGNOSIS — B370 Candidal stomatitis: Secondary | ICD-10-CM | POA: Diagnosis not present

## 2018-08-07 DIAGNOSIS — I82A19 Acute embolism and thrombosis of unspecified axillary vein: Secondary | ICD-10-CM | POA: Diagnosis not present

## 2018-08-07 DIAGNOSIS — Z515 Encounter for palliative care: Secondary | ICD-10-CM

## 2018-08-07 DIAGNOSIS — Z87891 Personal history of nicotine dependence: Secondary | ICD-10-CM

## 2018-08-07 DIAGNOSIS — J9600 Acute respiratory failure, unspecified whether with hypoxia or hypercapnia: Secondary | ICD-10-CM | POA: Diagnosis not present

## 2018-08-07 DIAGNOSIS — Z85118 Personal history of other malignant neoplasm of bronchus and lung: Secondary | ICD-10-CM | POA: Diagnosis not present

## 2018-08-07 DIAGNOSIS — C7951 Secondary malignant neoplasm of bone: Secondary | ICD-10-CM | POA: Diagnosis present

## 2018-08-07 DIAGNOSIS — I3139 Other pericardial effusion (noninflammatory): Secondary | ICD-10-CM | POA: Diagnosis present

## 2018-08-07 DIAGNOSIS — J9621 Acute and chronic respiratory failure with hypoxia: Secondary | ICD-10-CM | POA: Diagnosis not present

## 2018-08-07 DIAGNOSIS — Z780 Asymptomatic menopausal state: Secondary | ICD-10-CM | POA: Diagnosis not present

## 2018-08-07 DIAGNOSIS — I313 Pericardial effusion (noninflammatory): Secondary | ICD-10-CM | POA: Diagnosis not present

## 2018-08-07 DIAGNOSIS — D709 Neutropenia, unspecified: Secondary | ICD-10-CM | POA: Diagnosis present

## 2018-08-07 DIAGNOSIS — I314 Cardiac tamponade: Secondary | ICD-10-CM | POA: Diagnosis present

## 2018-08-07 DIAGNOSIS — C7801 Secondary malignant neoplasm of right lung: Secondary | ICD-10-CM | POA: Diagnosis not present

## 2018-08-07 DIAGNOSIS — Z923 Personal history of irradiation: Secondary | ICD-10-CM | POA: Diagnosis not present

## 2018-08-07 DIAGNOSIS — R52 Pain, unspecified: Secondary | ICD-10-CM | POA: Diagnosis not present

## 2018-08-07 DIAGNOSIS — J449 Chronic obstructive pulmonary disease, unspecified: Secondary | ICD-10-CM | POA: Diagnosis present

## 2018-08-07 DIAGNOSIS — C3412 Malignant neoplasm of upper lobe, left bronchus or lung: Secondary | ICD-10-CM | POA: Diagnosis not present

## 2018-08-07 DIAGNOSIS — R0602 Shortness of breath: Secondary | ICD-10-CM | POA: Diagnosis present

## 2018-08-07 DIAGNOSIS — E785 Hyperlipidemia, unspecified: Secondary | ICD-10-CM | POA: Diagnosis present

## 2018-08-07 DIAGNOSIS — Z79899 Other long term (current) drug therapy: Secondary | ICD-10-CM

## 2018-08-07 DIAGNOSIS — Z811 Family history of alcohol abuse and dependence: Secondary | ICD-10-CM

## 2018-08-07 DIAGNOSIS — K5900 Constipation, unspecified: Secondary | ICD-10-CM | POA: Diagnosis not present

## 2018-08-07 DIAGNOSIS — Z9889 Other specified postprocedural states: Secondary | ICD-10-CM

## 2018-08-07 DIAGNOSIS — N182 Chronic kidney disease, stage 2 (mild): Secondary | ICD-10-CM | POA: Diagnosis present

## 2018-08-07 DIAGNOSIS — Z853 Personal history of malignant neoplasm of breast: Secondary | ICD-10-CM

## 2018-08-07 DIAGNOSIS — J810 Acute pulmonary edema: Secondary | ICD-10-CM | POA: Diagnosis not present

## 2018-08-07 DIAGNOSIS — R0902 Hypoxemia: Secondary | ICD-10-CM | POA: Diagnosis not present

## 2018-08-07 DIAGNOSIS — R630 Anorexia: Secondary | ICD-10-CM | POA: Diagnosis not present

## 2018-08-07 DIAGNOSIS — J439 Emphysema, unspecified: Secondary | ICD-10-CM | POA: Diagnosis not present

## 2018-08-07 DIAGNOSIS — R531 Weakness: Secondary | ICD-10-CM | POA: Diagnosis not present

## 2018-08-07 DIAGNOSIS — I82890 Acute embolism and thrombosis of other specified veins: Secondary | ICD-10-CM | POA: Diagnosis not present

## 2018-08-07 DIAGNOSIS — Z681 Body mass index (BMI) 19 or less, adult: Secondary | ICD-10-CM

## 2018-08-07 DIAGNOSIS — F419 Anxiety disorder, unspecified: Secondary | ICD-10-CM | POA: Diagnosis present

## 2018-08-07 DIAGNOSIS — E46 Unspecified protein-calorie malnutrition: Secondary | ICD-10-CM | POA: Diagnosis not present

## 2018-08-07 DIAGNOSIS — Z9221 Personal history of antineoplastic chemotherapy: Secondary | ICD-10-CM | POA: Diagnosis not present

## 2018-08-07 DIAGNOSIS — R739 Hyperglycemia, unspecified: Secondary | ICD-10-CM | POA: Diagnosis present

## 2018-08-07 DIAGNOSIS — R918 Other nonspecific abnormal finding of lung field: Secondary | ICD-10-CM | POA: Diagnosis not present

## 2018-08-07 DIAGNOSIS — Z7189 Other specified counseling: Secondary | ICD-10-CM | POA: Diagnosis not present

## 2018-08-07 LAB — COMPREHENSIVE METABOLIC PANEL
ALT: 31 U/L (ref 0–44)
ALT: 26 U/L (ref 0–44)
AST: 33 U/L (ref 15–41)
AST: 27 U/L (ref 15–41)
Albumin: 3.5 g/dL (ref 3.5–5.0)
Albumin: 3 g/dL — ABNORMAL LOW (ref 3.5–5.0)
Alkaline Phosphatase: 68 U/L (ref 38–126)
Alkaline Phosphatase: 58 U/L (ref 38–126)
Anion gap: 11 (ref 5–15)
Anion gap: 12 (ref 5–15)
BUN: 24 mg/dL — ABNORMAL HIGH (ref 8–23)
BUN: 18 mg/dL (ref 8–23)
CO2: 24 mmol/L (ref 22–32)
CO2: 21 mmol/L — ABNORMAL LOW (ref 22–32)
Calcium: 8.8 mg/dL — ABNORMAL LOW (ref 8.9–10.3)
Calcium: 8.7 mg/dL — ABNORMAL LOW (ref 8.9–10.3)
Chloride: 95 mmol/L — ABNORMAL LOW (ref 98–111)
Chloride: 97 mmol/L — ABNORMAL LOW (ref 98–111)
Creatinine, Ser: 0.8 mg/dL (ref 0.44–1.00)
Creatinine, Ser: 0.73 mg/dL (ref 0.44–1.00)
GFR calc Af Amer: >60 mL/min (ref >60)
GFR calc Af Amer: >60 mL/min (ref >60)
GFR calc non Af Amer: >60 mL/min (ref >60)
GFR calc non Af Amer: >60 mL/min (ref >60)
Glucose, Bld: 130 mg/dL — ABNORMAL HIGH (ref 70–99)
Glucose, Bld: 130 mg/dL — ABNORMAL HIGH (ref 70–99)
Potassium: 4.5 mmol/L (ref 3.5–5.1)
Potassium: 4.8 mmol/L (ref 3.5–5.1)
Sodium: 130 mmol/L — ABNORMAL LOW (ref 135–145)
Sodium: 130 mmol/L — ABNORMAL LOW (ref 135–145)
Total Bilirubin: 0.8 mg/dL (ref 0.3–1.2)
Total Bilirubin: 0.9 mg/dL (ref 0.3–1.2)
Total Protein: 6.1 g/dL — ABNORMAL LOW (ref 6.5–8.1)
Total Protein: 5.6 g/dL — ABNORMAL LOW (ref 6.5–8.1)

## 2018-08-07 LAB — CBC WITH DIFFERENTIAL/PLATELET
Abs Immature Granulocytes: 0.05 10*3/uL (ref 0.00–0.07)
Basophils Absolute: 0 10*3/uL (ref 0.0–0.1)
Basophils Relative: 0 %
Eosinophils Absolute: 0.3 10*3/uL (ref 0.0–0.5)
Eosinophils Relative: 3 %
HCT: 40.4 % (ref 36.0–46.0)
Hemoglobin: 13.4 g/dL (ref 12.0–15.0)
Immature Granulocytes: 1 %
Lymphocytes Relative: 1 %
Lymphs Abs: 0.1 10*3/uL — ABNORMAL LOW (ref 0.7–4.0)
MCH: 33.3 pg (ref 26.0–34.0)
MCHC: 33.2 g/dL (ref 30.0–36.0)
MCV: 100.5 fL — ABNORMAL HIGH (ref 80.0–100.0)
Monocytes Absolute: 0.3 10*3/uL (ref 0.1–1.0)
Monocytes Relative: 3 %
Neutro Abs: 9.5 10*3/uL — ABNORMAL HIGH (ref 1.7–7.7)
Neutrophils Relative %: 92 %
Platelets: 193 10*3/uL (ref 150–400)
RBC: 4.02 MIL/uL (ref 3.87–5.11)
RDW: 12.7 % (ref 11.5–15.5)
WBC: 10.3 10*3/uL (ref 4.0–10.5)
nRBC: 0 % (ref 0.0–0.2)

## 2018-08-07 LAB — TROPONIN I: Troponin I: <0.03 ng/mL (ref <0.03)

## 2018-08-07 LAB — URINALYSIS, ROUTINE W REFLEX MICROSCOPIC
Bilirubin Urine: NEGATIVE
Glucose, UA: NEGATIVE mg/dL
Hgb urine dipstick: NEGATIVE
Ketones, ur: 5 mg/dL — AB
Leukocytes,Ua: NEGATIVE
Nitrite: NEGATIVE
Protein, ur: NEGATIVE mg/dL
Specific Gravity, Urine: 1.044 — ABNORMAL HIGH (ref 1.005–1.030)
pH: 5 (ref 5.0–8.0)

## 2018-08-07 LAB — ECHOCARDIOGRAM LIMITED
Height: 66 in
WEIGHTICAEL: 1862.45 [oz_av]

## 2018-08-07 LAB — CBC
HCT: 39.5 % (ref 36.0–46.0)
Hemoglobin: 13.3 g/dL (ref 12.0–15.0)
MCH: 33.4 pg (ref 26.0–34.0)
MCHC: 33.7 g/dL (ref 30.0–36.0)
MCV: 99.2 fL (ref 80.0–100.0)
Platelets: 139 10*3/uL — ABNORMAL LOW (ref 150–400)
RBC: 3.98 MIL/uL (ref 3.87–5.11)
RDW: 12.6 % (ref 11.5–15.5)
WBC: 7.8 10*3/uL (ref 4.0–10.5)
nRBC: 0 % (ref 0.0–0.2)

## 2018-08-07 LAB — MRSA PCR SCREENING: MRSA by PCR: NEGATIVE

## 2018-08-07 LAB — HEPARIN LEVEL (UNFRACTIONATED): HEPARIN UNFRACTIONATED: 0.23 [IU]/mL — AB (ref 0.30–0.70)

## 2018-08-07 MED ORDER — HEPARIN BOLUS VIA INFUSION
1500.0000 [IU] | Freq: Once | INTRAVENOUS | Status: AC
  Administered 2018-08-07: 1500 [IU] via INTRAVENOUS
  Filled 2018-08-07: qty 1500

## 2018-08-07 MED ORDER — CEFAZOLIN SODIUM-DEXTROSE 2-4 GM/100ML-% IV SOLN
2.0000 g | INTRAVENOUS | Status: AC
  Administered 2018-08-08: 2 g via INTRAVENOUS
  Filled 2018-08-07: qty 100

## 2018-08-07 MED ORDER — ACETAMINOPHEN 500 MG PO TABS: 1000.0000 mg | ORAL_TABLET | Freq: Once | ORAL | Status: DC

## 2018-08-07 MED ORDER — HEPARIN (PORCINE) 25000 UT/250ML-% IV SOLN
900.0000 [IU]/h | INTRAVENOUS | Status: DC
  Administered 2018-08-07: 900 [IU]/h via INTRAVENOUS
  Filled 2018-08-07: qty 250

## 2018-08-07 MED ORDER — SODIUM CHLORIDE 0.9% FLUSH
3.0000 mL | Freq: Two times a day (BID) | INTRAVENOUS | Status: DC
  Administered 2018-08-07: 3 mL via INTRAVENOUS

## 2018-08-07 MED ORDER — NYSTATIN 100000 UNIT/ML MT SUSP
5.0000 mL | Freq: Four times a day (QID) | OROMUCOSAL | Status: DC
  Administered 2018-08-07 – 2018-08-12 (×18): 500000 [IU] via ORAL
  Filled 2018-08-07 (×16): qty 5

## 2018-08-07 MED ORDER — TRAMADOL HCL 50 MG PO TABS: 50.0000 mg | ORAL_TABLET | Freq: Four times a day (QID) | ORAL | Status: DC | PRN

## 2018-08-07 MED ORDER — GUAIFENESIN ER 600 MG PO TB12
600.0000 mg | ORAL_TABLET | Freq: Two times a day (BID) | ORAL | Status: DC | PRN
  Administered 2018-08-09: 600 mg via ORAL
  Filled 2018-08-07: qty 1

## 2018-08-07 MED ORDER — IPRATROPIUM BROMIDE 0.03 % NA SOLN: 2.0000 | Freq: Two times a day (BID) | NASAL | Status: DC | PRN

## 2018-08-07 MED ORDER — ONDANSETRON HCL 4 MG PO TABS: 4.0000 mg | ORAL_TABLET | Freq: Four times a day (QID) | ORAL | Status: DC | PRN

## 2018-08-07 MED ORDER — ALBUTEROL SULFATE 1.25 MG/3ML IN NEBU: 1.0000 | INHALATION_SOLUTION | Freq: Four times a day (QID) | RESPIRATORY_TRACT | Status: DC | PRN

## 2018-08-07 MED ORDER — HEPARIN (PORCINE) 25000 UT/250ML-% IV SOLN: 1000.0000 [IU]/h | INTRAVENOUS | Status: DC

## 2018-08-07 MED ORDER — SODIUM CHLORIDE (PF) 0.9 % IJ SOLN
INTRAMUSCULAR | Status: AC
  Filled 2018-08-07: qty 50

## 2018-08-07 MED ORDER — LACTATED RINGERS IV SOLN: INTRAVENOUS | Status: DC

## 2018-08-07 MED ORDER — CLONAZEPAM 0.5 MG PO TABS
0.5000 mg | ORAL_TABLET | Freq: Every evening | ORAL | Status: DC | PRN
  Administered 2018-08-07 – 2018-08-10 (×2): 1 mg via ORAL
  Filled 2018-08-07 (×2): qty 2

## 2018-08-07 MED ORDER — MORPHINE SULFATE (PF) 2 MG/ML IV SOLN
2.0000 mg | INTRAVENOUS | Status: DC | PRN
  Administered 2018-08-07 – 2018-08-08 (×3): 2 mg via INTRAVENOUS
  Filled 2018-08-07 (×3): qty 1

## 2018-08-07 MED ORDER — MOMETASONE FURO-FORMOTEROL FUM 200-5 MCG/ACT IN AERO
2.0000 | INHALATION_SPRAY | Freq: Two times a day (BID) | RESPIRATORY_TRACT | Status: DC
  Administered 2018-08-08 – 2018-08-12 (×8): 2 via RESPIRATORY_TRACT
  Filled 2018-08-07 (×2): qty 8.8

## 2018-08-07 MED ORDER — ORAL CARE MOUTH RINSE
15.0000 mL | Freq: Two times a day (BID) | OROMUCOSAL | Status: DC
  Administered 2018-08-09 – 2018-08-11 (×3): 15 mL via OROMUCOSAL

## 2018-08-07 MED ORDER — ALBUTEROL SULFATE (2.5 MG/3ML) 0.083% IN NEBU
2.5000 mg | INHALATION_SOLUTION | Freq: Four times a day (QID) | RESPIRATORY_TRACT | Status: DC | PRN
  Administered 2018-08-09: 2.5 mg via RESPIRATORY_TRACT
  Filled 2018-08-07: qty 3

## 2018-08-07 MED ORDER — ACETAMINOPHEN 325 MG PO TABS: 650.0000 mg | ORAL_TABLET | Freq: Four times a day (QID) | ORAL | Status: DC | PRN

## 2018-08-07 MED ORDER — GABAPENTIN 100 MG PO CAPS
100.0000 mg | ORAL_CAPSULE | Freq: Three times a day (TID) | ORAL | Status: DC
  Administered 2018-08-07 – 2018-08-11 (×10): 100 mg via ORAL
  Filled 2018-08-07 (×11): qty 1

## 2018-08-07 MED ORDER — ACETAMINOPHEN 650 MG RE SUPP: 650.0000 mg | Freq: Four times a day (QID) | RECTAL | Status: DC | PRN

## 2018-08-07 MED ORDER — SODIUM CHLORIDE 0.9 % IV BOLUS
1000.0000 mL | Freq: Once | INTRAVENOUS | Status: AC
  Administered 2018-08-07: 1000 mL via INTRAVENOUS

## 2018-08-07 MED ORDER — ONDANSETRON HCL 4 MG/2ML IJ SOLN: 4.0000 mg | Freq: Four times a day (QID) | INTRAMUSCULAR | Status: DC | PRN

## 2018-08-07 MED ORDER — IPRATROPIUM-ALBUTEROL 0.5-2.5 (3) MG/3ML IN SOLN
3.0000 mL | Freq: Once | RESPIRATORY_TRACT | Status: AC
  Administered 2018-08-07: 3 mL via RESPIRATORY_TRACT
  Filled 2018-08-07: qty 3

## 2018-08-07 MED ORDER — FENTANYL CITRATE (PF) 100 MCG/2ML IJ SOLN
50.0000 ug | Freq: Once | INTRAMUSCULAR | Status: AC
  Administered 2018-08-07: 50 ug via INTRAVENOUS
  Filled 2018-08-07: qty 2

## 2018-08-07 MED ORDER — PROCHLORPERAZINE MALEATE 5 MG PO TABS: 5.0000 mg | ORAL_TABLET | Freq: Four times a day (QID) | ORAL | Status: DC | PRN

## 2018-08-07 MED ORDER — DOCUSATE SODIUM 100 MG PO CAPS
100.0000 mg | ORAL_CAPSULE | Freq: Two times a day (BID) | ORAL | Status: DC
  Administered 2018-08-07: 100 mg via ORAL
  Filled 2018-08-07: qty 1

## 2018-08-07 MED ORDER — IOHEXOL 350 MG/ML SOLN
100.0000 mL | Freq: Once | INTRAVENOUS | Status: AC | PRN
  Administered 2018-08-07: 75 mL via INTRAVENOUS

## 2018-08-07 MED ORDER — SODIUM CHLORIDE 0.9 % IV BOLUS
500.0000 mL | Freq: Once | INTRAVENOUS | Status: AC
  Administered 2018-08-07: 500 mL via INTRAVENOUS

## 2018-08-07 NOTE — ED Notes (Signed)
MADE PT AWARE OF NEW BED ASSIGNMENT 1428

## 2018-08-07 NOTE — Progress Notes (Signed)
ANTICOAGULATION CONSULT NOTE - Follow Up Consult  Pharmacy Consult for heparin Indication: DVT  Labs: Recent Labs    08/05/18 1138 08/07/18 1147 08/07/18 1150 08/07/18 2253  HGB 14.0 13.4  --  13.3  HCT 42.4 40.4  --  39.5  PLT 194 193  --  139*  HEPARINUNFRC  --   --   --  0.23*  CREATININE 0.75 0.80  --  0.73  TROPONINI  --   --  <0.03  --     Assessment: 73yo female subtherapeutic on heparin with initial dosing for DVT; no gtt issues or signs of bleeding per RN.  Goal of Therapy:  Heparin level 0.3-0.7 units/ml   Plan:  Will increase heparin gtt by 2 units/kg/hr to 1000 units/hr and check level prior to gtt being turned off for OR.    Wynona Neat, PharmD, BCPS  08/07/2018,11:36 PM

## 2018-08-07 NOTE — ED Notes (Signed)
ED TO INPATIENT HANDOFF REPORT  ED Nurse Name and Phone #: Chauncy Mangiaracina [phone]  S Name/Age/Gender Leah Howell 73 y.o. female Room/Bed: WA20/WA20  Code Status   Code Status: Not on file  Home/SNF/Other Home Patient oriented to: self, place, time and situation Is this baseline? Yes   Triage Complete: Triage complete  Chief Complaint SOB; Sent from Dr  Triage Note Pt is a cancer patient and pt had chemo on Monday. Pt has a hx of lung cancer. Pt has SOB and wheezing at this time as well as swelling gin the right arm and underarm. Pt reports pain on the left shoulder as well.    Allergies No Known Allergies  Level of Care/Admitting Diagnosis ED Disposition    ED Disposition Condition Comment   Admit  Hospital Area: [place] [[id number]]  Level of Care: Med-Surg [16]  Diagnosis: Acute on chronic respiratory failure with hypoxia [facility]) [[id number]]  Admitting Physician: Karmen Bongo [[id number]]  Attending Physician: Karmen Bongo [[id number]]  Estimated length of stay: past midnight tomorrow  Certification:: I certify this patient will need inpatient services for at least 2 midnights  PT Class (Do Not Modify): Inpatient [101]  PT Acc Code (Do Not Modify): Private [1]       B Medical/Surgery History Past Medical History:  Diagnosis Date  . 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 MRI brain pending as of 3/3/.  Pt to see Dr. Earlie Server approx 3/10 to discuss plan.  . Chronic renal insufficiency, stage 2 (mild)    GFR 60s  . COPD (chronic obstructive pulmonary disease) ([place])   . Hyperlipidemia    Mild--TLC  . Personal history of lung cancer 2008   2008; upper lobe R lung (Dr. Earlie Server).  Got chemo and rad.  Has been on observation and monitoring with annual CT's of chest since 2009.  2017 was "released" by onc.  06/2018->LUL mass;-->lung ca, +mets on PET scan.   Past Surgical  History:  Procedure Laterality Date  . LUNG BIOPSY  07/04/2018   LUL mass  . MEDIASTINOSCOPY  2008   for bx of lung mass (Dr. Arlyce Dice)  . TUBAL LIGATION       A IV Location/Drains/Wounds Patient Lines/Drains/Airways Status   Active Line/Drains/Airways    Name:   Placement date:   Placement time:   Site:   Days:   Peripheral IV 08/07/18 Left Antecubital   08/07/18    1146    Antecubital   less than 1   Peripheral IV 08/07/18 Left Forearm   08/07/18    1437    Forearm   less than 1   Incision (Closed) 07/04/18 Chest Left;Upper   07/04/18    1210     34          Intake/Output Last 24 hours  Intake/Output Summary (Last 24 hours) at 08/07/2018 1530 Last data filed at 08/07/2018 1438 Gross per 24 hour  Intake 500 ml  Output -  Net 500 ml    Labs/Imaging Results for orders placed or performed during the hospital encounter of 08/07/18 (from the past 48 hour(s))  Comprehensive metabolic panel     Status: Abnormal   Collection Time: 08/07/18 11:47 AM  Result Value Ref Range   Sodium 130 (L) 135 - 145 mmol/L   Potassium 4.5 3.5 - 5.1 mmol/L   Chloride 95 (L) 98 - 111 mmol/L  CO2 24 22 - 32 mmol/L   Glucose, Bld 130 (H) 70 - 99 mg/dL   BUN 24 (H) 8 - 23 mg/dL   Creatinine, Ser 0.80 0.44 - 1.00 mg/dL   Calcium 8.8 (L) 8.9 - 10.3 mg/dL   Total Protein 6.1 (L) 6.5 - 8.1 g/dL   Albumin 3.5 3.5 - 5.0 g/dL   AST 33 15 - 41 U/L   ALT 31 0 - 44 U/L   Alkaline Phosphatase 68 38 - 126 U/L   Total Bilirubin 0.8 0.3 - 1.2 mg/dL   GFR calc non Af Amer >60 >60 mL/min   GFR calc Af Amer >60 >60 mL/min   Anion gap 11 5 - 15    Comment: Performed at [facility], [place] [address] [place], [place] [address]  CBC with Differential     Status: Abnormal   Collection Time: 08/07/18 11:47 AM  Result Value Ref Range   WBC 10.3 4.0 - 10.5 K/uL   RBC 4.02 3.87 - 5.11 MIL/uL   Hemoglobin 13.4 12.0 - 15.0 g/dL   HCT 40.4 36.0 - 46.0 %   MCV 100.5 (H) 80.0 - 100.0 fL   MCH 33.3  26.0 - 34.0 pg   MCHC 33.2 30.0 - 36.0 g/dL   RDW 12.7 11.5 - 15.5 %   Platelets 193 150 - 400 K/uL   nRBC 0.0 0.0 - 0.2 %   Neutrophils Relative % 92 %   Neutro Abs 9.5 (H) 1.7 - 7.7 K/uL   Lymphocytes Relative 1 %   Lymphs Abs 0.1 (L) 0.7 - 4.0 K/uL   Monocytes Relative 3 %   Monocytes Absolute 0.3 0.1 - 1.0 K/uL   Eosinophils Relative 3 %   Eosinophils Absolute 0.3 0.0 - 0.5 K/uL   Basophils Relative 0 %   Basophils Absolute 0.0 0.0 - 0.1 K/uL   Immature Granulocytes 1 %   Abs Immature Granulocytes 0.05 0.00 - 0.07 K/uL    Comment: Performed at [facility], [place] [address] [place], [place] [address]  Troponin I - ONCE - STAT     Status: None   Collection Time: 08/07/18 11:50 AM  Result Value Ref Range   Troponin I <0.03 <0.03 ng/mL    Comment: Performed at [facility], [place] [address], [place], [place] [address]   Ct Angio Chest Pe W And/or Wo Contrast  Result Date: 08/07/2018 CLINICAL DATA:  Increasing shortness of breath. Worsening left shoulder pain. History of lung cancer. EXAM: CT ANGIOGRAPHY CHEST WITH CONTRAST TECHNIQUE: Multidetector CT imaging of the chest was performed using the standard protocol during bolus administration of intravenous contrast. Multiplanar CT image reconstructions and MIPs were obtained to evaluate the vascular anatomy. CONTRAST:  70mL OMNIPAQUE IOHEXOL 350 MG/ML SOLN COMPARISON:  PET-CT dated July 11, 2018. CT chest dated June 18, 2018. FINDINGS: Cardiovascular: Satisfactory opacification of the pulmonary arteries to the segmental level. No evidence of acute pulmonary embolism. There is a small, peripheral nonocclusive thrombus in the left lower lobe medial basal segmental pulmonary artery (series 4, image 62). Normal heart size. New small to moderate complex pericardial effusion. No thoracic aortic aneurysm or dissection. Coronary, aortic arch, and branch vessel atherosclerotic vascular disease.  Mediastinum/Nodes: Soft tissue mass in the anterior mediastinum and chest wall invading the sternal manubrium is grossly unchanged, measuring 2.8 x 5.0 cm, previously 2.8 x 5.1 cm. The soft tissue mass versus lymphadenopathy in the left anterior cardiophrenic angle has increased in size,  now measuring 6.0 x 4.0 cm, previously 4.2 x 3.1 cm. Unchanged rightward mediastinal shift. No enlarged mediastinal, hilar, or axillary lymph nodes. The thyroid gland, trachea, and esophagus demonstrate no significant findings. Lungs/Pleura: Left upper lobe mass invading the anterior chest wall at the anterior first intercostal space has increased in size, now measuring 5.4 x 4.1 cm, previously 5.0 x 3.6 cm. Two nodules in the right lower lobe have increased in size, now measuring 7 and 9 mm, respectively, previously 5 and 7 mm. A 6 mm nodule in the left lower lobe has also increased in size, previously measuring 4 mm. Increasing small to moderate left pleural effusion. New small to moderate right pleural effusion. Prior right upper lobectomy. Unchanged complete collapse of the right middle lobe. Chronic pleural thickening at the right lung apex is unchanged. Bilateral lower lobe atelectasis and scarring. Upper Abdomen: No acute abnormality. Musculoskeletal: New T2 mild compression fracture with mild heterogeneity of the vertebral body. Unchanged chronic moderate T4 and mild T11 compression deformities. Review of the MIP images confirms the above findings. IMPRESSION: 1. No evidence of acute pulmonary embolism. Small, peripheral, nonocclusive chronic thrombus in the left lower lobe medial basal segmental pulmonary artery. 2. Findings overall consistent with progression of disease with enlargement of the left upper lobe mass, left cardiophrenic angle mediastinal mass, and small pulmonary metastases in both lower lobes. Unchanged anterior mediastinal and chest wall metastasis involving the sternum. 3. New mild pathologic compression  fracture of T2. 4. New small to moderate complex pericardial effusion. Increasing small to moderate bilateral pleural effusions. 5. Prior right upper lobectomy. Unchanged complete collapse of the right middle lobe. 6.  Aortic atherosclerosis (ICD10-I70.0). Electronically Signed   By: Titus Dubin M.D.   On: 08/07/2018 13:15   Dg Chest Portable 1 View  Result Date: 08/07/2018 CLINICAL DATA:  Shortness of breath, lung cancer, COPD, wheezing, pain in LEFT shoulder as well EXAM: PORTABLE CHEST 1 VIEW COMPARISON:  Portable exam 1128 hours compared to PET/CT 07/11/2018 FINDINGS: Enlargement of cardiac silhouette. Prominent RIGHT mediastinal soft tissue border is unchanged, appears related to volume loss in the RIGHT hemithorax with mediastinal shift to the RIGHT on prior PET-CT. Atherosclerotic calcification aorta. Pleuroparenchymal opacity at RIGHT apex unchanged. Again identified LEFT upper lobe mass approximately 4.9 x 4.6 cm. Bibasilar atelectasis. No acute infiltrate, pleural effusion or pneumothorax. Bones demineralized. IMPRESSION: Known LEFT upper lobe neoplasm. RIGHT upper lobe scarring and chronic pleuroparenchymal opacification with mediastinal shift to the RIGHT. COPD changes with bibasilar atelectasis. Electronically Signed   By: Lavonia Dana M.D.   On: 08/07/2018 11:56   Dg Shoulder Left Portable  Result Date: 08/07/2018 CLINICAL DATA:  Shortness of breath, lung cancer, COPD, wheezing, pain in LEFT shoulder as well EXAM: LEFT SHOULDER - 1 VIEW COMPARISON:  None FINDINGS: Osseous demineralization. AC joint alignment normal. Visualized ribs unremarkable. No acute fracture, dislocation, bone destruction or sclerosis. LEFT upper lobe neoplasm again identified as well as LEFT basilar atelectasis. IMPRESSION: LEFT upper lobe pulmonary neoplasm. Osseous demineralization without acute bony findings. Electronically Signed   By: Lavonia Dana M.D.   On: 08/07/2018 11:53   Ue Venous Duplex ([facility] And [facility]  Only)  Result Date: 08/07/2018 UPPER VENOUS STUDY  Indications: Swelling Risk Factors: Lung cancer. Performing Technologist: June Leap RDMS, RVT  Examination Guidelines: A complete evaluation includes B-mode imaging, spectral Doppler, color Doppler, and power Doppler as needed of all accessible portions of each vessel. Bilateral testing is considered an integral part of a complete  examination. Limited examinations for reoccurring indications may be performed as noted.  Right Findings: +----------+------------+---------+-----------+----------+---------------------+ RIGHT     CompressiblePhasicitySpontaneousProperties       Summary        +----------+------------+---------+-----------+----------+---------------------+ IJV           None       No        No                                     +----------+------------+---------+-----------+----------+---------------------+ Subclavian    None       No        No                flow is noted very                                                        proximal vein only   +----------+------------+---------+-----------+----------+---------------------+ Axillary      None       No        No                                     +----------+------------+---------+-----------+----------+---------------------+ Brachial      None       No        No                                     +----------+------------+---------+-----------+----------+---------------------+ Radial        Full                                                        +----------+------------+---------+-----------+----------+---------------------+ Ulnar         Full                                                        +----------+------------+---------+-----------+----------+---------------------+ Cephalic      Full                                                         +----------+------------+---------+-----------+----------+---------------------+ Basilic       None                                                        +----------+------------+---------+-----------+----------+---------------------+  Left Findings: +----------+------------+---------+-----------+----------+-------+ LEFT      CompressiblePhasicitySpontaneousPropertiesSummary +----------+------------+---------+-----------+----------+-------+ Subclavian    Full  Yes       Yes                      +----------+------------+---------+-----------+----------+-------+  Summary:  Right: Findings consistent with acute deep vein thrombosis involving the right internal jugular veins, right subclavian veins, right axillary vein and right brachial veins. Findings consistent with acute superficial vein thrombosis involving the right basilic vein.  Left: No evidence of thrombosis in the subclavian.  *See table(s) above for measurements and observations.  Diagnosing physician: Curt Jews MD Electronically signed by Curt Jews MD on 08/07/2018 at 2:30:25 PM.    Final     Pending Labs Unresulted Labs (From admission, onward)    Start     Ordered   08/08/18 0500  CBC  Daily,   R     08/07/18 1412   08/07/18 2300  Heparin level (unfractionated)  Once-Timed,   R     08/07/18 1412          Vitals/Pain Today's Vitals   08/07/18 1200 08/07/18 1243 08/07/18 1430 08/07/18 1500  BP: 105/66 115/66 124/74 132/69  Pulse: (!) 115 (!) 120 (!) 122 (!) 126  Resp: 20 16 (!) 23 (!) 26  Temp:      TempSrc:      SpO2: 95% 99% 94% 96%  Weight:      Height:      PainSc:        Isolation Precautions No active isolations  Medications Medications  sodium chloride (PF) 0.9 % injection (has no administration in time range)  heparin ADULT infusion 100 units/mL (25000 units/226mL sodium chloride 0.45%) (900 Units/hr Intravenous New Bag/Given 08/07/18 1445)  fentaNYL (SUBLIMAZE) injection 50 mcg (has no  administration in time range)  ipratropium-albuterol (DUONEB) 0.5-2.5 (3) MG/3ML nebulizer solution 3 mL (3 mLs Nebulization Given 08/07/18 1138)  iohexol (OMNIPAQUE) 350 MG/ML injection 100 mL (75 mLs Intravenous Contrast Given 08/07/18 1245)  sodium chloride 0.9 % bolus 500 mL (0 mLs Intravenous Stopped 08/07/18 1438)  heparin bolus via infusion 1,500 Units (1,500 Units Intravenous Bolus from Bag 08/07/18 1445)    Mobility walks Low fall risk   Focused Assessments   R Recommendations: See Admitting Provider Note  Report given to:   Additional Notes:

## 2018-08-07 NOTE — Telephone Encounter (Signed)
Pt talked to [facility].

## 2018-08-07 NOTE — ED Notes (Signed)
Report given to Sonia Side with [facility]

## 2018-08-07 NOTE — ED Notes (Signed)
XRAY AT BEDSIDE

## 2018-08-07 NOTE — ED Notes (Signed)
PAIN TO RUE X 1 MONTH. ACUTE SHORTNESS OF BREATH INCREASED SINCE STEROIDS ENDED HOWEVER DISTRESS STRESS THIS AM. TALK TO PCP OVER PHONE WAS TOLD TO COME TO ED FOR EVALUATION. NEBS WITHOUT RELIEF.

## 2018-08-07 NOTE — ED Triage Notes (Signed)
Pt is a cancer patient and pt had chemo on Monday. Pt has a hx of lung cancer. Pt has SOB and wheezing at this time as well as swelling gin the right arm and underarm. Pt reports pain on the left shoulder as well.

## 2018-08-07 NOTE — Consult Note (Signed)
Cardiology Consultation:   Patient ID: Leah Howell MRN: [id number]; DOB: Aug 15, 1945  Admit date: 08/07/2018 Date of Consult: 08/07/2018  Primary Care Provider: Tammi Sou, MD Primary Cardiologist: No primary care provider on file. New Primary Electrophysiologist:  None    Patient Profile:   Leah Howell is a 73 y.o. female with a hx of lung cancer, with metastatic disease, HLD and COPD who is being seen today for the evaluation of pericardial effusion at the request of Dr. Ander Howell.  History of Present Illness:   Leah Howell with above history presented to [facility] ER with SOB and arm swelling.  Also with neck and throat pain, lt neck.  It began the swelling a couple of weeks ago.  She has hx of SOB but has not qualified for home oxygen.  She has a cough which is chronic without changes.  She cannot breathe deep enough to cough up sputum. Her home neb did not help.  The SOB has increased. No chest pains.  No history of cardiac issues. She was found to have marked upper ext edema Rt > Lt.  No fever and no sick contacts.    She is undergoing recurrent lung cancer treatment-hx of Rt upper lobectomy .  Rt arm swelling for months with CTA of chest today with significant DVT to IJ without PE.  Increased tumor burden, + pericardial and pleural effusions.    Both effusions new from 06/18/18.  There is a small, peripheral nonocclusive thrombus in the left lower lobe medial basal segmental pulmonary artery (series 4, image 62).  Unchanged complete collapse of the right middle lobe.  New T2 mild compression fracture with mild heterogeneity of the vertebral body.  Venous doppler upper rt ext. With acute deep vein thrombosis involving the right internal jugular veins, right subclavian veins, right axillary vein and right brachial veins. Findings consistent with acute superficial vein thrombosis involving the right basilic vein.  Echo today limited with moderate size circumferential pericardial  effusion with RV diastolic collapse and respirophasic septal shift. Dilated, noncollapsible IVC and dilated hepatic veins. HR 123 bpm, BP 107/83 mmHg. These findings in combination suggest tamponade physiology.  EF ?65%, mildly increased LV wall thickness, RV collapses easily in diastole.  Cannot exclude Lt atrial mobile mass vs. Normal post. Wall coming in and out of plane with cardiac translational motion.  The inferior vena cava was dilated in size with <50% respiratory variability.  There is moderate mitral annular calcification present.  HR 122  EKG:  The EKG was personally reviewed and demonstrates:  Howell at 150 peaked T waves low voltage pericardial leads.  Telemetry:  Telemetry was personally reviewed and demonstrates:  Howell  Na 130, K+ 4.5, BUN 24, Cr 0.80 Tropin <0.03 Hgb 13.4 WBC 10.3 and plts 193.  With DVT she was placed on IV heparin.   Now transferred to [facility] on CCM service.  Possible need for pericardiocentesis vs. Window with lung cancer.  Also concern for bleeding on the heparin.      Attempting ABGs.  Past Medical History:  Diagnosis Date   Cancer of upper lobe of left lung ([place]) 06/2018   Bx+ carcinoma, PET + mediastinal mets, R lung mets, pelvic region bone mets. MRI brain pending as of 3/3/.  Pt to see Dr. Earlie Howell approx 3/10 to discuss plan.   Chronic renal insufficiency, stage 2 (mild)    GFR 60s   COPD (chronic obstructive pulmonary disease) (HCC)    Hyperlipidemia    Mild--TLC  Personal history of lung cancer 2008   2008; upper lobe R lung (Dr. Earlie Howell).  Got chemo and rad.  Has been on observation and monitoring with annual CT's of chest since 2009.  2017 was "released" by onc.  06/2018->LUL mass;-->lung ca, +mets on PET scan.    Past Surgical History:  Procedure Laterality Date   LUNG BIOPSY  07/04/2018   LUL mass   MEDIASTINOSCOPY  2008   for bx of lung mass (Dr. Arlyce Howell)   [place] Medications:  Prior to Admission medications     Medication Sig Start Date End Date Taking? Authorizing Provider  albuterol (ACCUNEB) 1.25 MG/3ML nebulizer solution Take 3 mLs (1.25 mg total) by nebulization every 6 (six) hours as needed. 02/28/18  Yes McGowen, [name], MD  albuterol (VENTOLIN HFA) 108 (90 Base) MCG/ACT inhaler Inhale 1-2 puffs into the lungs every 4 (four) hours as needed for wheezing or shortness of breath. 05/13/18  Yes McGowen, [name], MD  budesonide-formoterol [facility]) 160-4.5 MCG/ACT inhaler Inhale 2 puffs into the lungs 2 (two) times daily. 02/28/18  Yes McGowen, [name], MD  clonazePAM (KLONOPIN) 0.5 MG tablet 1-2 tabs po qhs prn insomnia and worry Patient taking differently: Take 0.5-1 mg by mouth at bedtime as needed for anxiety. 1-2 tabs po qhs prn insomnia and worry 03/01/18  Yes McGowen, [name], MD  gabapentin (NEURONTIN) 100 MG capsule Take 1 capsule (100 mg total) by mouth 3 (three) times daily. 07/29/18  Yes Leah Howell., PA-C  guaiFENesin (MUCINEX) 600 MG 12 hr tablet Take 600 mg by mouth 2 (two) times daily as needed for cough or to loosen phlegm.   Yes [provider]  ibuprofen (ADVIL,MOTRIN) 200 MG tablet Take 600 mg by mouth every 8 (eight) hours as needed.   Yes [provider]  ipratropium (ATROVENT) 0.03 % nasal spray Place 2 sprays into both nostrils every 12 (twelve) hours. Patient taking differently: Place 2 sprays into both nostrils 2 (two) times daily as needed for rhinitis.  10/11/17  Yes McGowen, [name], MD  prochlorperazine (COMPAZINE) 5 MG tablet 1 to 2 tablets PO QID prn nausea. Patient taking differently: Take 5-10 mg by mouth 4 (four) times daily as needed for nausea. 1 to 2 tablets PO QID prn nausea. 07/31/18  Yes Leah Howell., PA-C  traMADol (ULTRAM) 50 MG tablet Take 1 tablet (50 mg total) by mouth every 6 (six) hours as needed for moderate pain. 07/29/18  Yes Leah Howell., PA-C  methylPREDNISolone (MEDROL DOSEPAK) 4 MG TBPK tablet 6 x 1 day, 5 x 1 day, 4 x 1  day, 3 x 1 day, 2 x 1 day, 1 x 1 day 07/29/18   Leah Stanford., PA-C    Inpatient Medications: Scheduled Meds:  docusate sodium  100 mg Oral BID   gabapentin  100 mg Oral TID   mometasone-formoterol  2 puff Inhalation BID   nystatin  5 mL Oral QID   sodium chloride (PF)       sodium chloride flush  3 mL Intravenous Q12H   Continuous Infusions:  heparin     PRN Meds: acetaminophen **OR** acetaminophen, albuterol, clonazePAM, guaiFENesin, ipratropium, morphine injection, ondansetron **OR** ondansetron (ZOFRAN) IV  Allergies:   No Known Allergies  Social History:   Social History   Socioeconomic History   Marital status: Married    Spouse name: Not on file   Number of children: Not on file  Years of education: Not on file   Highest education level: Not on file  Occupational History   Not on file  Social Needs   Financial resource strain: Not on file   Food insecurity:    Worry: Not on file    Inability: Not on file   Transportation needs:    Medical: Not on file    Non-medical: Not on file  Tobacco Use   Smoking status: Former Smoker    Types: Cigarettes    Last attempt to quit: 05/15/2002    Years since quitting: 16.2   Smokeless tobacco: Never Used  Substance and Sexual Activity   Alcohol use: Yes    Comment: occasional   Drug use: No   Sexual activity: Yes    Birth control/protection: Post-menopausal  Lifestyle   Physical activity:    Days per week: Not on file    Minutes per session: Not on file   Stress: Not on file  Relationships   Social connections:    Talks on phone: Not on file    Gets together: Not on file    Attends religious service: Not on file    Active member of club or organization: Not on file    Attends meetings of clubs or organizations: Not on file    Relationship status: Not on file   Intimate partner violence:    Fear of current or ex partner: Not on file    Emotionally abused: Not on file    Physically  abused: Not on file    Forced sexual activity: Not on file  Other Topics Concern   Not on file  Social History Narrative   Married, one daughter.   HS education.  Homemaker.   Former smoker; quit 2004.   No alcohol or drugs.     Exercise: walking     Family History:    Family History  Problem Relation Age of Onset   Alcohol abuse Mother    Heart disease Mother    Alcohol abuse Father    Liver disease Brother      ROS:  Please see the history of present illness.  General:no colds or fevers, no weight changes Skin:no rashes or ulcers HEENT:no blurred vision, no congestion CV:see HPI PUL:see HPI GI:no diarrhea constipation or melena, no indigestion GU:no hematuria, no dysuria MS:no joint pain, no claudication Neuro:no syncope, no lightheadedness Endo:no diabetes, no thyroid disease  All other ROS reviewed and negative.     Physical Exam/Data:   Vitals:   08/07/18 1630 08/07/18 1715 08/07/18 1800 08/07/18 1803  BP: 117/87  (!) 148/79   Pulse: (!) 121 (!) 127  (!) 119  Resp: (!) 24 (!) 24 (!) 30 (!) 22  Temp:    97.9 F (36.6 C)  TempSrc:    Oral  SpO2: 95% 96%  100%  Weight:      Height:        Intake/Output Summary (Last 24 hours) at 08/07/2018 1812 Last data filed at 08/07/2018 1438 Gross per 24 hour  Intake 500 ml  Output --  Net 500 ml   Last 3 Weights 08/07/2018 08/05/2018 07/29/2018  Weight (lbs) 116 lb 6.5 oz 116 lb 8 oz 115 lb  Weight (kg) 52.8 kg 52.844 kg 52.164 kg     Body mass index is 18.79 kg/m.  General:  Well nourished, well developed, mild to moderate distress needing to sit up in bed to breath HEENT: normal Lymph: no adenopathy Neck: no JVD Endocrine:  No thryomegaly Vascular: No carotid bruits; pedal pulses 2+ bilaterally   Cardiac:  normal S1, S2; RRR; no murmur gallup or rub.  Lungs:  Decreased breath sounds to auscultation bilaterally, occ wheezing, occ rhonchi no rales  Abd: soft, nontender, no hepatomegaly  Ext: no edema of  lower ext, Rt arm swollen into Rt neck  Musculoskeletal:  No deformities,  BLE strength normal and equal Skin: warm and dry  Neuro:  Alert and oriented X 3 MAE, follows commands, no focal abnormalities noted Psych:  Normal affect    Relevant CV Studies: Echo 08/07/18 IMPRESSIONS    1. Moderate size circumferential pericardial effusion with RV diastolic collapse and respirophasic septal shift. Dilated, noncollapsible IVC and dilated hepatic veins. HR 123 bpm, BP 107/83 mmHg. These findings in combination suggest tamponade  physiology.  2. Dr. Tonna Corner was contacted at 4:39pm 08/07/2018 with critical findings, and acknowledged.  3. The left ventricle has hyperdynamic systolic function, with an ejection fraction of >65%. There is mildly increased left ventricular wall thickness. indeterminate.  4. The right ventricle collapses easily in diastole.  5. Cannot exclude left atrial mobile mass vs. normal posterior wall coming in and out of plane with cardiac translational motion.  6. The inferior vena cava was dilated in size with <50% respiratory variability.  7. There is moderate mitral annular calcification present.  FINDINGS  Left Ventricle: The left ventricle has hyperdynamic systolic function, with an ejection fraction of >65%. There is mildly increased left ventricular wall thickness. Left ventricular diastolic Doppler parameters are consistent with indeterminate  diastolic dysfunction. Right Ventricle: The right ventricle collapses easily in diastole. Left Atrium: Left atrial size was not assessed. Cannot exclude left atrial mobile mass vs. normal posterior wall coming in and out of plane with cardiac translational motion. Right Atrium: Right atrial size was not assessed. Right atrial pressure is estimated at 15 mmHg. Mitral Valve: There is moderate mitral annular calcification present. Venous: The inferior vena cava is dilated in size with less than 50% respiratory  variability.     Laboratory Data:  Chemistry Recent Labs  Lab 08/05/18 1138 08/07/18 1147  NA 134* 130*  K 3.9 4.5  CL 98 95*  CO2 21* 24  GLUCOSE 96 130*  BUN 9 24*  CREATININE 0.75 0.80  CALCIUM 9.3 8.8*  GFRNONAA >60 >60  GFRAA >60 >60  ANIONGAP 15 11    Recent Labs  Lab 08/05/18 1138 08/07/18 1147  PROT 6.9 6.1*  ALBUMIN 3.8 3.5  AST 21 33  ALT 20 31  ALKPHOS 91 68  BILITOT 0.8 0.8   Hematology Recent Labs  Lab 08/05/18 1138 08/07/18 1147  WBC 5.2 10.3  RBC 4.22 4.02  HGB 14.0 13.4  HCT 42.4 40.4  MCV 100.5* 100.5*  MCH 33.2 33.3  MCHC 33.0 33.2  RDW 12.7 12.7  PLT 194 193   Cardiac Enzymes Recent Labs  Lab 08/07/18 1150  TROPONINI <0.03   No results for input(s): TROPIPOC in the last 168 hours.  BNPNo results for input(s): BNP, PROBNP in the last 168 hours.  DDimer No results for input(s): DDIMER in the last 168 hours.  Radiology/Studies:  Ct Angio Chest Pe W And/or Wo Contrast  Result Date: 08/07/2018 CLINICAL DATA:  Increasing shortness of breath. Worsening left shoulder pain. History of lung cancer. EXAM: CT ANGIOGRAPHY CHEST WITH CONTRAST TECHNIQUE: Multidetector CT imaging of the chest was performed using the standard protocol during bolus administration of intravenous contrast. Multiplanar CT image reconstructions and MIPs were  obtained to evaluate the vascular anatomy. CONTRAST:  30mL OMNIPAQUE IOHEXOL 350 MG/ML SOLN COMPARISON:  PET-CT dated July 11, 2018. CT chest dated June 18, 2018. FINDINGS: Cardiovascular: Satisfactory opacification of the pulmonary arteries to the segmental level. No evidence of acute pulmonary embolism. There is a small, peripheral nonocclusive thrombus in the left lower lobe medial basal segmental pulmonary artery (series 4, image 62). Normal heart size. New small to moderate complex pericardial effusion. No thoracic aortic aneurysm or dissection. Coronary, aortic arch, and branch vessel atherosclerotic vascular  disease. Mediastinum/Nodes: Soft tissue mass in the anterior mediastinum and chest wall invading the sternal manubrium is grossly unchanged, measuring 2.8 x 5.0 cm, previously 2.8 x 5.1 cm. The soft tissue mass versus lymphadenopathy in the left anterior cardiophrenic angle has increased in size, now measuring 6.0 x 4.0 cm, previously 4.2 x 3.1 cm. Unchanged rightward mediastinal shift. No enlarged mediastinal, hilar, or axillary lymph nodes. The thyroid gland, trachea, and esophagus demonstrate no significant findings. Lungs/Pleura: Left upper lobe mass invading the anterior chest wall at the anterior first intercostal space has increased in size, now measuring 5.4 x 4.1 cm, previously 5.0 x 3.6 cm. Two nodules in the right lower lobe have increased in size, now measuring 7 and 9 mm, respectively, previously 5 and 7 mm. A 6 mm nodule in the left lower lobe has also increased in size, previously measuring 4 mm. Increasing small to moderate left pleural effusion. New small to moderate right pleural effusion. Prior right upper lobectomy. Unchanged complete collapse of the right middle lobe. Chronic pleural thickening at the right lung apex is unchanged. Bilateral lower lobe atelectasis and scarring. Upper Abdomen: No acute abnormality. Musculoskeletal: New T2 mild compression fracture with mild heterogeneity of the vertebral body. Unchanged chronic moderate T4 and mild T11 compression deformities. Review of the MIP images confirms the above findings. IMPRESSION: 1. No evidence of acute pulmonary embolism. Small, peripheral, nonocclusive chronic thrombus in the left lower lobe medial basal segmental pulmonary artery. 2. Findings overall consistent with progression of disease with enlargement of the left upper lobe mass, left cardiophrenic angle mediastinal mass, and small pulmonary metastases in both lower lobes. Unchanged anterior mediastinal and chest wall metastasis involving the sternum. 3. New mild pathologic  compression fracture of T2. 4. New small to moderate complex pericardial effusion. Increasing small to moderate bilateral pleural effusions. 5. Prior right upper lobectomy. Unchanged complete collapse of the right middle lobe. 6.  Aortic atherosclerosis (ICD10-I70.0). Electronically Signed   By: Titus Dubin M.D.   On: 08/07/2018 13:15   Dg Chest Portable 1 View  Result Date: 08/07/2018 CLINICAL DATA:  Shortness of breath, lung cancer, COPD, wheezing, pain in LEFT shoulder as well EXAM: PORTABLE CHEST 1 VIEW COMPARISON:  Portable exam 1128 hours compared to PET/CT 07/11/2018 FINDINGS: Enlargement of cardiac silhouette. Prominent RIGHT mediastinal soft tissue border is unchanged, appears related to volume loss in the RIGHT hemithorax with mediastinal shift to the RIGHT on prior PET-CT. Atherosclerotic calcification aorta. Pleuroparenchymal opacity at RIGHT apex unchanged. Again identified LEFT upper lobe mass approximately 4.9 x 4.6 cm. Bibasilar atelectasis. No acute infiltrate, pleural effusion or pneumothorax. Bones demineralized. IMPRESSION: Known LEFT upper lobe neoplasm. RIGHT upper lobe scarring and chronic pleuroparenchymal opacification with mediastinal shift to the RIGHT. COPD changes with bibasilar atelectasis. Electronically Signed   By: Lavonia Dana M.D.   On: 08/07/2018 11:56   Dg Shoulder Left Portable  Result Date: 08/07/2018 CLINICAL DATA:  Shortness of breath, lung cancer, COPD, wheezing,  pain in LEFT shoulder as well EXAM: LEFT SHOULDER - 1 VIEW COMPARISON:  None FINDINGS: Osseous demineralization. AC joint alignment normal. Visualized ribs unremarkable. No acute fracture, dislocation, bone destruction or sclerosis. LEFT upper lobe neoplasm again identified as well as LEFT basilar atelectasis. IMPRESSION: LEFT upper lobe pulmonary neoplasm. Osseous demineralization without acute bony findings. Electronically Signed   By: Lavonia Dana M.D.   On: 08/07/2018 11:53   Ue Venous Duplex ([facility] And  [facility] Only)  Result Date: 08/07/2018 UPPER VENOUS STUDY  Indications: Swelling Risk Factors: Lung cancer. Performing Technologist: June Leap RDMS, RVT  Examination Guidelines: A complete evaluation includes B-mode imaging, spectral Doppler, color Doppler, and power Doppler as needed of all accessible portions of each vessel. Bilateral testing is considered an integral part of a complete examination. Limited examinations for reoccurring indications may be performed as noted.  Right Findings: +----------+------------+---------+-----------+----------+---------------------+  RIGHT      Compressible Phasicity Spontaneous Properties        Summary         +----------+------------+---------+-----------+----------+---------------------+  IJV            None        No         No                                        +----------+------------+---------+-----------+----------+---------------------+  Subclavian     None        No         No                  flow is noted very                                                               proximal vein only    +----------+------------+---------+-----------+----------+---------------------+  Axillary       None        No         No                                        +----------+------------+---------+-----------+----------+---------------------+  Brachial       None        No         No                                        +----------+------------+---------+-----------+----------+---------------------+  Radial         Full                                                             +----------+------------+---------+-----------+----------+---------------------+  Ulnar          Full                                                             +----------+------------+---------+-----------+----------+---------------------+  Cephalic       Full                                                              +----------+------------+---------+-----------+----------+---------------------+  Basilic        None                                                             +----------+------------+---------+-----------+----------+---------------------+  Left Findings: +----------+------------+---------+-----------+----------+-------+  LEFT       Compressible Phasicity Spontaneous Properties Summary  +----------+------------+---------+-----------+----------+-------+  Subclavian     Full        Yes        Yes                         +----------+------------+---------+-----------+----------+-------+  Summary:  Right: Findings consistent with acute deep vein thrombosis involving the right internal jugular veins, right subclavian veins, right axillary vein and right brachial veins. Findings consistent with acute superficial vein thrombosis involving the right basilic vein.  Left: No evidence of thrombosis in the subclavian.  *See table(s) above for measurements and observations.  Diagnosing physician: Curt Jews MD Electronically signed by Curt Jews MD on 08/07/2018 at 2:30:25 PM.    Final     Assessment and Plan:   1. Pericardial effusion with possible tamponade. Need for pericardiocentesis vs. Window. Per H&P Dr. Angelena Form recommended TCTS consult for window, with malignant effusion.   Dr. Darcey Nora is seeing pt as well. 2. Upper ext Rt DVT on IV heparin.  3. Acute on chronic respiratory failure with hypoxia and known hx of COPD per CCM 4. Lung cancer with progression of disease.   Pt is DNR.      For questions or updates, please contact [place] Please consult [email or web address] for contact info under     Signed, Cecilie Kicks, NP  08/07/2018 6:12 PM

## 2018-08-07 NOTE — ED Notes (Signed)
EMT received a call requesting that we help pt from vehicle. Pt's daughter informed the EMT that pt could not speak for herself and that she needed to come with patient. EMT and RN informed patient's daughter that we have strict visitor restrictions at this time and daughter again stated that the patient could not speak for herself.  As patient is a reported cancer patient she was taken straight back to a room for triage. Upon further talking to patient it became clear that the patient is alert and oriented and just SOB, but is able to speak for herself and is able to communicate appropriately.  Patient's daughter stated "I just need to speak to the doctor and then I will go". RN explicitly told patient's visitor that she would have to leave as soon as possible and she could have [place] call her or she could be assisted to video chat on patient's personal electronic device. RN made assistant director aware of what had occurred.

## 2018-08-07 NOTE — Telephone Encounter (Signed)
Returning pt's phone calls to radiation, MD Mohamed's desk, and PA Van's desk.  Spoke to pt & her daughter [name].  Pt reports left scapular/shoulder pain that started last night after chemo f/u call.  Called to cancel her radiation appt this am d/t the pain.  Reports increased SOB since stopping steroids on Saturday that were [facility].  States pain from shoulder makes it hard to breathe as well.  Hx COPD.  Reports swelling in R hand/arm without loss of sensation/ROM or injury, redness in R eye, and sore throat.  A&Ox4.  Denies fever/chills, CP, N/V/D/C or changes in bowels/urination.  Denies recent travel or cough.  Per MD Julien Nordmann pt advised originally to take ibuprofen along with Tramadol (pt states it isn't helping the pain), however with additional symptoms she is advised to go to the ED for evaluation.  Pt states she would like to go to the [facility] rather than the [facility].  Pt & daughter VU of instructions and to call back as needed.  Injection appt cancelled, LPN Jasmine made aware.  Radiation appt cancelled today already, called Sharee Pimple RN in Radiation to alert her that pt was sent to ED.

## 2018-08-07 NOTE — Progress Notes (Signed)
Infection Prevention Specialist called and MD notified about precautions. RN given orders to D/C precautions.

## 2018-08-07 NOTE — Progress Notes (Signed)
  Subjective: Patient examined, today's CT scan of chest and echocardiogram images personally reviewed and discussed with patient.  Patient was reviewed with Dr. Debara Pickett in the ICU this evening for coordination of care.  73 year old female with history of non-small cell carcinoma the lung since 2009.  She is under the care of Dr. Earlie Server and is receiving regular chemotherapy as well as radiation therapy.  She previously has had right lung cancer and now has a large left lung cancer.  She developed increasing shortness of breath and was admitted to the hospital through the ED.  She is found to have a moderate to centimeter pericardial effusion by CT scan and confirmed by echo.  She has evidence of early tamponade.  CT showed no evidence of pulmonary embolus but she was found by ultrasound to have DVT of the right IJ subclavian and axillary veins.  She admitted to the ICU and placed on heparin and evaluation for pericardial window was requested.  Objective: Vital signs in last 24 hours: Temp:  [97.9 F (36.6 C)-98 F (36.7 C)] 97.9 F (36.6 C) (03/25 1907) Pulse Rate:  [115-130] 125 (03/25 1907) Cardiac Rhythm: Sinus tachycardia (03/25 1803) Resp:  [16-30] 27 (03/25 1907) BP: (105-148)/(51-87) 140/51 (03/25 1907) SpO2:  [94 %-100 %] 98 % (03/25 1907) Weight:  [52.8 kg] 52.8 kg (03/25 1107)  Hemodynamic parameters for last 24 hours:  Sinus tachycardia, blood pressure stable  Intake/Output from previous day: No intake/output data recorded. Intake/Output this shift: No intake/output data recorded.  Elderly chronically ill female mildly tachypneic sitting up in ICU bed on nasal cannula Right upper extremity swollen, increased right JVD Sinus tachycardia without rub or murmur Slightly diminished breath sounds bilaterally No lower extremity edema No abdominal pain  Lab Results: Recent Labs    08/05/18 1138 08/07/18 1147  WBC 5.2 10.3  HGB 14.0 13.4  HCT 42.4 40.4  PLT 194 193    BMET:  Recent Labs    08/05/18 1138 08/07/18 1147  NA 134* 130*  K 3.9 4.5  CL 98 95*  CO2 21* 24  GLUCOSE 96 130*  BUN 9 24*  CREATININE 0.75 0.80  CALCIUM 9.3 8.8*    PT/INR: No results for input(s): LABPROT, INR in the last 72 hours. ABG No results found for: PHART, HCO3, TCO2, ACIDBASEDEF, O2SAT CBG (last 3)  No results for input(s): GLUCAP in the last 72 hours.  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  Patient would benefit from subxiphoid pericardial window which will be planned in the morning.  She is on heparin for DVT and that will be stopped 2-3hours before surgery.  I have discussed the procedure of subxiphoid pericardial window including the expected benefits the alternatives and risks.  She understands that with advanced cancer she may not survive this hospitalization even with a successful pericardial window.  DNR policy will be rescinded for the surgery and immediate postoperative interval.  LOS: 0 days    Tharon Aquas Trigt III 08/07/2018

## 2018-08-07 NOTE — ED Notes (Signed)
Patient transported to CT 

## 2018-08-07 NOTE — Anesthesia Preprocedure Evaluation (Addendum)
Anesthesia Evaluation  Patient identified by MRN, date of birth, ID band Patient awake    Reviewed: Allergy & Precautions, NPO status , Patient's Chart, lab work & pertinent test results  Airway Mallampati: III  TM Distance: >3 FB Neck ROM: Full    Dental  (+) Edentulous Upper, Edentulous Lower   Pulmonary COPD,  COPD inhaler, former smoker,  Pleural effusions lung cancer   Pulmonary exam normal breath sounds clear to auscultation       Cardiovascular negative cardio ROS   Rhythm:Regular Rate:Tachycardia  ECG: ST, rate 117  ECHO:  1. Moderate size circumferential pericardial effusion with RV diastolic collapse and respirophasic septal shift. Dilated, noncollapsible IVC and dilated hepatic veins. HR 123 bpm, BP 107/83 mmHg. These findings in combination suggest tamponade  physiology. 2. Dr. Tonna Corner was contacted at 4:39pm 08/07/2018 with critical findings, and acknowledged. 3. The left ventricle has hyperdynamic systolic function, with an ejection fraction of >65%. There is mildly increased left ventricular wall thickness. indeterminate. 4. The right ventricle collapses easily in diastole. 5. Cannot exclude left atrial mobile mass vs. normal posterior wall coming in and out of plane with cardiac translational motion. 6. The inferior vena cava was dilated in size with <50% respiratory variability. 7. There is moderate mitral annular calcification present.     Neuro/Psych negative neurological ROS  negative psych ROS   GI/Hepatic negative GI ROS, Neg liver ROS,   Endo/Other  negative endocrine ROS  Renal/GU negative Renal ROS     Musculoskeletal negative musculoskeletal ROS (+)   Abdominal   Peds  Hematology HLD   Anesthesia Other Findings Malignant Pericardial effusion  Reproductive/Obstetrics                           Anesthesia Physical Anesthesia Plan  ASA: IV  Anesthesia  Plan: General   Post-op Pain Management:    Induction: Intravenous  PONV Risk Score and Plan: 3 and Ondansetron, Dexamethasone and Treatment may vary due to age or medical condition  Airway Management Planned: Oral ETT  Additional Equipment: Arterial line, CVP, TEE and Ultrasound Guidance Line Placement  Intra-op Plan:   Post-operative Plan: Extubation in OR  Informed Consent: I have reviewed the patients History and Physical, chart, labs and discussed the procedure including the risks, benefits and alternatives for the proposed anesthesia with the patient or authorized representative who has indicated his/her understanding and acceptance.     Dental advisory given  Plan Discussed with: CRNA  Anesthesia Plan Comments: (TEE for intraoperative monitoring only)      Anesthesia Quick Evaluation

## 2018-08-07 NOTE — ED Provider Notes (Signed)
[facility] DEPT Provider Note   CSN: [id number] Arrival date & time: 08/07/18  1051    History   Chief Complaint Chief Complaint  Patient presents with   Shortness of Breath   Arm Swelling    HPI Leah Howell is a 73 y.o. female with history of lung cancer, COPD who presents with worsening shortness of breath, left shoulder pain as well as swelling and pain to the right arm.  Patient has had lymphedema in her right arm for the past several months, however her oncologist was concerned about a possible blood clot.  Patient's shoulder pain began just yesterday.  She denies any injury.  He does not make it any worse with movement.  She denies any fever.  She has had a persistent cough for the past several months.  She is no recent travel or exposure to anyone with known COVID-19.  Patient denies any chest pain, abdominal pain, nausea, vomiting, urinary symptoms.  Patient just started chemo 2 days ago and developed a subconjunctival hemorrhage in her right eye immediately following chemo which is nonpainful and does not obstruct her vision.  Patient called her oncologist about all these issues and was told to come to the emergency department.     HPI  Past Medical History:  Diagnosis Date   Cancer of upper lobe of left lung ([place]) 06/2018   Bx+ carcinoma, PET + mediastinal mets, R lung mets, pelvic region bone mets. MRI brain pending as of 3/3/.  Pt to see Dr. Earlie Server approx 3/10 to discuss plan.   Chronic renal insufficiency, stage 2 (mild)    GFR 60s   COPD (chronic obstructive pulmonary disease) (HCC)    Hyperlipidemia    Mild--TLC   Personal history of lung cancer 2008   2008; upper lobe R lung (Dr. Earlie Server).  Got chemo and rad.  Has been on observation and monitoring with annual CT's of chest since 2009.  2017 was "released" by onc.  06/2018->LUL mass;-->lung ca, +mets on PET scan.    Patient Active Problem List   Diagnosis Date Noted     Acute on chronic respiratory failure with hypoxia ([place]) 08/07/2018   Non-small cell carcinoma of left lung, stage 4 ([place]) 07/23/2018   Goals of care, counseling/discussion 07/23/2018   Encounter for antineoplastic chemotherapy 07/23/2018   Encounter for antineoplastic immunotherapy 07/23/2018   Colon cancer screening 02/06/2013   COPD (chronic obstructive pulmonary disease) ([place]) 08/07/2012   Cervical cancer screening 08/07/2012   Personal history of lung cancer 08/07/2012   Cancer of upper lobe of right lung ([place]) 01/25/2012   CHRONIC OBSTRUCTIVE PULMONARY DISEASE, MODERATE 01/07/2008   CHRONIC&OTH PULMONARY MANIFESTS DUE RADIATION 11/05/2007    Past Surgical History:  Procedure Laterality Date   LUNG BIOPSY  07/04/2018   LUL mass   MEDIASTINOSCOPY  2008   for bx of lung mass (Dr. Arlyce Dice)   TUBAL LIGATION       OB History    Gravida  2   Para  2   Term      Preterm      AB      Living  2     SAB      TAB      Ectopic      Multiple      Live Births  2            Home Medications    Prior to Admission medications   Medication Sig Start Date  End Date Taking? Authorizing Provider  albuterol (ACCUNEB) 1.25 MG/3ML nebulizer solution Take 3 mLs (1.25 mg total) by nebulization every 6 (six) hours as needed. 02/28/18  Yes McGowen, [name], MD  albuterol (VENTOLIN HFA) 108 (90 Base) MCG/ACT inhaler Inhale 1-2 puffs into the lungs every 4 (four) hours as needed for wheezing or shortness of breath. 05/13/18  Yes McGowen, [name], MD  budesonide-formoterol [facility]) 160-4.5 MCG/ACT inhaler Inhale 2 puffs into the lungs 2 (two) times daily. 02/28/18  Yes McGowen, [name], MD  clonazePAM (KLONOPIN) 0.5 MG tablet 1-2 tabs po qhs prn insomnia and worry Patient taking differently: Take 0.5-1 mg by mouth at bedtime as needed for anxiety. 1-2 tabs po qhs prn insomnia and worry 03/01/18  Yes McGowen, [name], MD  gabapentin (NEURONTIN) 100 MG capsule  Take 1 capsule (100 mg total) by mouth 3 (three) times daily. 07/29/18  Yes Tanner, Lyndon Code., PA-C  guaiFENesin (MUCINEX) 600 MG 12 hr tablet Take 600 mg by mouth 2 (two) times daily as needed for cough or to loosen phlegm.   Yes [provider]  ibuprofen (ADVIL,MOTRIN) 200 MG tablet Take 600 mg by mouth every 8 (eight) hours as needed.   Yes [provider]  ipratropium (ATROVENT) 0.03 % nasal spray Place 2 sprays into both nostrils every 12 (twelve) hours. Patient taking differently: Place 2 sprays into both nostrils 2 (two) times daily as needed for rhinitis.  10/11/17  Yes McGowen, [name], MD  prochlorperazine (COMPAZINE) 5 MG tablet 1 to 2 tablets PO QID prn nausea. Patient taking differently: Take 5-10 mg by mouth 4 (four) times daily as needed for nausea. 1 to 2 tablets PO QID prn nausea. 07/31/18  Yes Tanner, Lyndon Code., PA-C  traMADol (ULTRAM) 50 MG tablet Take 1 tablet (50 mg total) by mouth every 6 (six) hours as needed for moderate pain. 07/29/18  Yes Tanner, Lyndon Code., PA-C  methylPREDNISolone (MEDROL DOSEPAK) 4 MG TBPK tablet 6 x 1 day, 5 x 1 day, 4 x 1 day, 3 x 1 day, 2 x 1 day, 1 x 1 day 07/29/18   Harle Stanford., PA-C    Family History Family History  Problem Relation Age of Onset   Alcohol abuse Mother    Heart disease Mother    Alcohol abuse Father    Liver disease Brother     Social History Social History   Tobacco Use   Smoking status: Former Smoker    Types: Cigarettes    Last attempt to quit: 05/15/2002    Years since quitting: 16.2   Smokeless tobacco: Never Used  Substance Use Topics   Alcohol use: Yes    Comment: occasional   Drug use: No     Allergies   Patient has no known allergies.   Review of Systems Review of Systems  Constitutional: Negative for chills and fever.  HENT: Negative for facial swelling and sore throat.   Eyes: Positive for redness. Negative for visual disturbance.  Respiratory: Positive for cough (chronic) and  shortness of breath.   Cardiovascular: Negative for chest pain.  Gastrointestinal: Negative for abdominal pain, nausea and vomiting.  Genitourinary: Negative for dysuria.  Musculoskeletal: Positive for arthralgias. Negative for back pain.  Skin: Negative for rash and wound.  Neurological: Negative for headaches.  Psychiatric/Behavioral: The patient is not nervous/anxious.      Physical Exam Updated Vital Signs BP 124/74    Pulse (!) 122    Temp 98 F (36.7 C) (  Oral)    Resp (!) 23    Ht 5\' 6"  (1.676 m)    Wt 52.8 kg    SpO2 94%    BMI 18.79 kg/m   Physical Exam Vitals signs and nursing note reviewed.  Constitutional:      General: She is not in acute distress.    Appearance: She is well-developed. She is not diaphoretic.  HENT:     Head: Normocephalic and atraumatic.     Mouth/Throat:     Pharynx: No oropharyngeal exudate.  Eyes:     General: No scleral icterus.       Right eye: No discharge.        Left eye: No discharge.     Conjunctiva/sclera: Conjunctivae normal.     Pupils: Pupils are equal, round, and reactive to light.  Neck:     Musculoskeletal: Normal range of motion and neck supple.     Thyroid: No thyromegaly.  Cardiovascular:     Rate and Rhythm: Normal rate and regular rhythm.     Heart sounds: Normal heart sounds. No murmur. No friction rub. No gallop.   Pulmonary:     Effort: Pulmonary effort is normal. No respiratory distress.     Breath sounds: No stridor. Rhonchi (bilaterally) and rales (bilaterally) present. No wheezing.  Abdominal:     General: Bowel sounds are normal. There is no distension.     Palpations: Abdomen is soft.     Tenderness: There is no abdominal tenderness. There is no guarding or rebound.  Musculoskeletal:     Left shoulder: She exhibits normal range of motion, no tenderness, no bony tenderness and no swelling.     Comments: Lymphedema noted to right arm same induration onto medial aspect of upper arm and tenderness    Lymphadenopathy:     Cervical: No cervical adenopathy.  Skin:    General: Skin is warm and dry.     Coloration: Skin is not pale.     Findings: No rash.  Neurological:     Mental Status: She is alert.     Coordination: Coordination normal.      ED Treatments / Results  Labs (all labs ordered are listed, but only abnormal results are displayed) Labs Reviewed  COMPREHENSIVE METABOLIC PANEL - Abnormal; Notable for the following components:      Result Value   Sodium 130 (*)    Chloride 95 (*)    Glucose, Bld 130 (*)    BUN 24 (*)    Calcium 8.8 (*)    Total Protein 6.1 (*)    All other components within normal limits  CBC WITH DIFFERENTIAL/PLATELET - Abnormal; Notable for the following components:   MCV 100.5 (*)    Neutro Abs 9.5 (*)    Lymphs Abs 0.1 (*)    All other components within normal limits  TROPONIN I  HEPARIN LEVEL (UNFRACTIONATED)    EKG None  Radiology Ct Angio Chest Pe W And/or Wo Contrast  Result Date: 08/07/2018 CLINICAL DATA:  Increasing shortness of breath. Worsening left shoulder pain. History of lung cancer. EXAM: CT ANGIOGRAPHY CHEST WITH CONTRAST TECHNIQUE: Multidetector CT imaging of the chest was performed using the standard protocol during bolus administration of intravenous contrast. Multiplanar CT image reconstructions and MIPs were obtained to evaluate the vascular anatomy. CONTRAST:  66mL OMNIPAQUE IOHEXOL 350 MG/ML SOLN COMPARISON:  PET-CT dated July 11, 2018. CT chest dated June 18, 2018. FINDINGS: Cardiovascular: Satisfactory opacification of the pulmonary arteries to the segmental level.  No evidence of acute pulmonary embolism. There is a small, peripheral nonocclusive thrombus in the left lower lobe medial basal segmental pulmonary artery (series 4, image 62). Normal heart size. New small to moderate complex pericardial effusion. No thoracic aortic aneurysm or dissection. Coronary, aortic arch, and branch vessel atherosclerotic  vascular disease. Mediastinum/Nodes: Soft tissue mass in the anterior mediastinum and chest wall invading the sternal manubrium is grossly unchanged, measuring 2.8 x 5.0 cm, previously 2.8 x 5.1 cm. The soft tissue mass versus lymphadenopathy in the left anterior cardiophrenic angle has increased in size, now measuring 6.0 x 4.0 cm, previously 4.2 x 3.1 cm. Unchanged rightward mediastinal shift. No enlarged mediastinal, hilar, or axillary lymph nodes. The thyroid gland, trachea, and esophagus demonstrate no significant findings. Lungs/Pleura: Left upper lobe mass invading the anterior chest wall at the anterior first intercostal space has increased in size, now measuring 5.4 x 4.1 cm, previously 5.0 x 3.6 cm. Two nodules in the right lower lobe have increased in size, now measuring 7 and 9 mm, respectively, previously 5 and 7 mm. A 6 mm nodule in the left lower lobe has also increased in size, previously measuring 4 mm. Increasing small to moderate left pleural effusion. New small to moderate right pleural effusion. Prior right upper lobectomy. Unchanged complete collapse of the right middle lobe. Chronic pleural thickening at the right lung apex is unchanged. Bilateral lower lobe atelectasis and scarring. Upper Abdomen: No acute abnormality. Musculoskeletal: New T2 mild compression fracture with mild heterogeneity of the vertebral body. Unchanged chronic moderate T4 and mild T11 compression deformities. Review of the MIP images confirms the above findings. IMPRESSION: 1. No evidence of acute pulmonary embolism. Small, peripheral, nonocclusive chronic thrombus in the left lower lobe medial basal segmental pulmonary artery. 2. Findings overall consistent with progression of disease with enlargement of the left upper lobe mass, left cardiophrenic angle mediastinal mass, and small pulmonary metastases in both lower lobes. Unchanged anterior mediastinal and chest wall metastasis involving the sternum. 3. New mild  pathologic compression fracture of T2. 4. New small to moderate complex pericardial effusion. Increasing small to moderate bilateral pleural effusions. 5. Prior right upper lobectomy. Unchanged complete collapse of the right middle lobe. 6.  Aortic atherosclerosis (ICD10-I70.0). Electronically Signed   By: Titus Dubin M.D.   On: 08/07/2018 13:15   Dg Chest Portable 1 View  Result Date: 08/07/2018 CLINICAL DATA:  Shortness of breath, lung cancer, COPD, wheezing, pain in LEFT shoulder as well EXAM: PORTABLE CHEST 1 VIEW COMPARISON:  Portable exam 1128 hours compared to PET/CT 07/11/2018 FINDINGS: Enlargement of cardiac silhouette. Prominent RIGHT mediastinal soft tissue border is unchanged, appears related to volume loss in the RIGHT hemithorax with mediastinal shift to the RIGHT on prior PET-CT. Atherosclerotic calcification aorta. Pleuroparenchymal opacity at RIGHT apex unchanged. Again identified LEFT upper lobe mass approximately 4.9 x 4.6 cm. Bibasilar atelectasis. No acute infiltrate, pleural effusion or pneumothorax. Bones demineralized. IMPRESSION: Known LEFT upper lobe neoplasm. RIGHT upper lobe scarring and chronic pleuroparenchymal opacification with mediastinal shift to the RIGHT. COPD changes with bibasilar atelectasis. Electronically Signed   By: Lavonia Dana M.D.   On: 08/07/2018 11:56   Dg Shoulder Left Portable  Result Date: 08/07/2018 CLINICAL DATA:  Shortness of breath, lung cancer, COPD, wheezing, pain in LEFT shoulder as well EXAM: LEFT SHOULDER - 1 VIEW COMPARISON:  None FINDINGS: Osseous demineralization. AC joint alignment normal. Visualized ribs unremarkable. No acute fracture, dislocation, bone destruction or sclerosis. LEFT upper lobe neoplasm again identified  as well as LEFT basilar atelectasis. IMPRESSION: LEFT upper lobe pulmonary neoplasm. Osseous demineralization without acute bony findings. Electronically Signed   By: Lavonia Dana M.D.   On: 08/07/2018 11:53   Ue Venous  Duplex ([facility] Only)  Result Date: 08/07/2018 UPPER VENOUS STUDY  Indications: Swelling Risk Factors: Lung cancer. Performing Technologist: June Leap RDMS, RVT  Examination Guidelines: A complete evaluation includes B-mode imaging, spectral Doppler, color Doppler, and power Doppler as needed of all accessible portions of each vessel. Bilateral testing is considered an integral part of a complete examination. Limited examinations for reoccurring indications may be performed as noted.  Right Findings: +----------+------------+---------+-----------+----------+---------------------+  RIGHT      Compressible Phasicity Spontaneous Properties        Summary         +----------+------------+---------+-----------+----------+---------------------+  IJV            None        No         No                                        +----------+------------+---------+-----------+----------+---------------------+  Subclavian     None        No         No                  flow is noted very                                                               proximal vein only    +----------+------------+---------+-----------+----------+---------------------+  Axillary       None        No         No                                        +----------+------------+---------+-----------+----------+---------------------+  Brachial       None        No         No                                        +----------+------------+---------+-----------+----------+---------------------+  Radial         Full                                                             +----------+------------+---------+-----------+----------+---------------------+  Ulnar          Full                                                             +----------+------------+---------+-----------+----------+---------------------+  Cephalic       Full                                                              +----------+------------+---------+-----------+----------+---------------------+  Basilic        None                                                             +----------+------------+---------+-----------+----------+---------------------+  Left Findings: +----------+------------+---------+-----------+----------+-------+  LEFT       Compressible Phasicity Spontaneous Properties Summary  +----------+------------+---------+-----------+----------+-------+  Subclavian     Full        Yes        Yes                         +----------+------------+---------+-----------+----------+-------+  Summary:  Right: Findings consistent with acute deep vein thrombosis involving the right internal jugular veins, right subclavian veins, right axillary vein and right brachial veins. Findings consistent with acute superficial vein thrombosis involving the right basilic vein.  Left: No evidence of thrombosis in the subclavian.  *See table(s) above for measurements and observations.  Diagnosing physician: Curt Jews MD Electronically signed by Curt Jews MD on 08/07/2018 at 2:30:25 PM.    Final     Procedures Procedures (including critical care time)  Medications Ordered in ED Medications  sodium chloride (PF) 0.9 % injection (has no administration in time range)  heparin ADULT infusion 100 units/mL (25000 units/272mL sodium chloride 0.45%) (900 Units/hr Intravenous New Bag/Given 08/07/18 1445)  fentaNYL (SUBLIMAZE) injection 50 mcg (has no administration in time range)  ipratropium-albuterol (DUONEB) 0.5-2.5 (3) MG/3ML nebulizer solution 3 mL (3 mLs Nebulization Given 08/07/18 1138)  iohexol (OMNIPAQUE) 350 MG/ML injection 100 mL (75 mLs Intravenous Contrast Given 08/07/18 1245)  sodium chloride 0.9 % bolus 500 mL (0 mLs Intravenous Stopped 08/07/18 1438)  heparin bolus via infusion 1,500 Units (1,500 Units Intravenous Bolus from Bag 08/07/18 1445)     Initial Impression / Assessment and Plan / ED Course  I have reviewed  the triage vital signs and the nursing notes.  Pertinent labs & imaging results that were available during my care of the patient were reviewed by me and considered in my medical decision making (see chart for details).        Patient presenting with worsening shortness of breath as well as right arm pain and swelling and left shoulder pain.  Labs show sodium 130, chloride 95, BUN 24.  Patient given 500 mL fluid bolus.  DVT ultrasound is positive for DVT in the right upper extremity as well as superficial vein thrombosis involving the right basilic vein.  CT angio of the chest shows no PE, but small peripheral nonocclusive chronic thrombus in the left lower lobe; progression of disease with a left upper lobe mass, mediastinal mass, and small pulmonary mets in both lobes; new mild pathologic compression fracture of T2; new small to moderate complex pericardial effusion as well as increasing small-to-moderate bilateral pleural effusions.  I discussed these findings with patient's oncologist, Dr. Julien Nordmann, who recommended echo cardiogram for  assessment of pericardial effusion.  I recommended treatment of the DVT with heparin as well as continuing plan as scheduled for cancer treatment.  He is available for consultation as needed.  I discussed patient case with Dr. Inda Merlin with [facility] who accepts patient for admission.  I appreciate the above consultants for their assistance with the patient.  Patient also evaluated by my attending, Dr. Lacinda Axon, who guided the patient's management and agrees with plan.  Final Clinical Impressions(s) / ED Diagnoses   Final diagnoses:  Malignant neoplasm of upper lobe of left lung (HCC)  Acute deep vein thrombosis (DVT) of other vein of right upper extremity (HCC)  Pericardial effusion  Pleural effusion    ED Discharge Orders    None       Frederica Kuster, PA-C 08/07/18 1524    Nat Christen, MD 08/09/18 [phone]

## 2018-08-07 NOTE — Progress Notes (Signed)
RUE venous duplex       has been completed. Preliminary results can be found under CV proc through chart review. June Leap, BS, RDMS, RVT    Called positive results to Armstead Peaks, PA

## 2018-08-07 NOTE — ED Notes (Addendum)
ICE CHIPS AND SWABS GIVEN TO PT. PT REQUESTING SOMETHING TO [place]. PT MADE AWARE OF ADDITIONAL TESTING THAT NEEDED TO BE DONE. THIS WRITER WILL KEEP PT INFORMED

## 2018-08-07 NOTE — ED Notes (Signed)
ECHO AT BEDSIDE. PT GIVEN PHONE TO CALL FAMILY PT GIVEN WATER

## 2018-08-07 NOTE — Progress Notes (Signed)
RT NOTE: RT attempted to obtain ABG x2 with no success. Second RT to attempt. RN aware and at bedside. RT will continue to monitor as needed.

## 2018-08-07 NOTE — ED Notes (Signed)
DAUGHTER AND SISTER UPDATED ON PT'S CURRENT STATUS. AWARE OF PT'S ADMISSION TO [facility]

## 2018-08-07 NOTE — ED Provider Notes (Signed)
16:49: Received call from hospitalist Dr Lorin Mercy regarding this patient cared for by ER staff throughout prior shift. Patient has bedside echocardiogram performed that revealed malignant pericardial effusion w/ concerns for early tamponade. She is requesting EKG be obtained and patient be made aware of her pending transfer to [facility] ICU.   EKG ordered.   Immediately following conversation I evaluated patient at bedside. HR 122, BP 117/83. Alert and holding conversation with me appropriately. She was updated on results & management plan thus far. Questions answered. She requested I call and speak with her daughter to update her as well.   17:25: Re-discussed with Dr. Lorin Mercy who has discussed with cardiology & PCCM. Critical care has accepted admission w/ bed placement. Pending transfer  Vitals:   08/07/18 1800 08/07/18 1803  BP: (!) 148/79   Pulse:  (!) 119  Resp: (!) 30 (!) 22  Temp:  97.9 F (36.6 C)  SpO2:  100%    I spoke with patient's daughter Orvan Falconer (phone: [phone])  who has been made aware of results & plan of care. Provided opportunity for questions, she has confirmed understanding. Requesting to be called by patient's care team upon arrival to [facility] for plan of care update.   18:08: [facility] at bedside for transport.     Leafy Kindle 08/07/18 1820    Virgel Manifold, MD 08/07/18 1932

## 2018-08-07 NOTE — Progress Notes (Signed)
ANTICOAGULATION CONSULT NOTE - Initial Consult  Pharmacy Consult for IV heparin Indication: DVT  No Known Allergies  Patient Measurements: Height: 5\' 6"  (167.6 cm) Weight: 116 lb 6.5 oz (52.8 kg) IBW/kg (Calculated) : 59.3 Heparin Dosing Weight: 53 kg  Vital Signs: Temp: 98 F (36.7 C) (03/25 1056) Temp Source: Oral (03/25 1056) BP: 115/66 (03/25 1243) Pulse Rate: 120 (03/25 1243)  Labs: Recent Labs    08/05/18 1138 08/07/18 1147 08/07/18 1150  HGB 14.0 13.4  --   HCT 42.4 40.4  --   PLT 194 193  --   CREATININE 0.75 0.80  --   TROPONINI  --   --  <0.03    Estimated Creatinine Clearance: 53.8 mL/min (by C-G formula based on SCr of 0.8 mg/dL).   Medical History: Past Medical History:  Diagnosis Date  . Cancer of upper lobe of left lung ([place]) 06/2018   Bx+ carcinoma, PET + mediastinal mets, R lung mets, pelvic region bone mets. MRI brain pending as of 3/3/.  Pt to see Dr. Earlie Server approx 3/10 to discuss plan.  . Chronic renal insufficiency, stage 2 (mild)    GFR 60s  . COPD (chronic obstructive pulmonary disease) ([place])   . Hyperlipidemia    Mild--TLC  . Personal history of lung cancer 2008   2008; upper lobe R lung (Dr. Earlie Server).  Got chemo and rad.  Has been on observation and monitoring with annual CT's of chest since 2009.  2017 was "released" by onc.  06/2018->LUL mass;-->lung ca, +mets on PET scan.    Medications:  Scheduled:  . sodium chloride (PF)       Infusions:    Assessment: 73 yo female cancer patient presented to ER with RUE x 1 month found to have a DVT. To start IV heparin per Rx. Baseline labs drawn. Note on anticoag medication prior to admission  Goal of Therapy:  Heparin level 0.3-0.7 units/ml Monitor platelets by anticoagulation protocol: Yes   Plan:  1) IV heparin 1500 unit loading dose then 2) IV heparin 900 units/hr 3) Check heparin level 8 hours after start of IV heparin 4) Daily CBC and heparin level  Adrian Saran, PharmD,  BCPS 08/07/2018 2:10 PM

## 2018-08-07 NOTE — H&P (Signed)
History and Physical    Leah Howell AGT:[id number] DOB: 1946-02-26 DOA: 08/07/2018  PCP: Tammi Sou, MD Consultants:  Julien Nordmann - oncology; Sondra Come - rad onc Patient coming from:  Home - lives with husband; NOK: Daughter, [name], [phone]  Chief Complaint: SOB, arm swelling  HPI: Leah Howell is a 73 y.o. female with medical history significant of lung CA (2008, 06/2018) with metastatic disease; HLD; COPD; and stage 2 CKD presenting with  SOB, arm swelling.  She was having neck/throat pain in her left neck all night.  She called Dr. Julien Nordmann and he recommended that she come to the ER.  +SOB for "a long time" but she has not qualified for home O2 "even though I couldn't breathe."  Cough, not different from usual other than she cannot get enough breath to produce sputum.  Her neb treatment this AM wasn't helping because she couldn't breathe deeply enough.  No fever.  No sick contacts.  No n/v.  No LE edema but marked RUE > LUE edema.   ED Course:  Undergoing recurrent lung CA, presenting with worsening SOB, R arm swelling for months - significant DVT up to IJ without PE.  Worsening tumor burden, mets, pericardial/pleural effusions.  Sats in 90% but extremely dyspneic.  Has not met criteria for home O2, but constantly winded.  Ordered heparin.  Started chemo 2 days ago, followed by Dr. Julien Nordmann and also on radiation therapy.  Will consult oncology.  Review of Systems: As per HPI; otherwise review of systems reviewed and negative.   Ambulatory Status:  Ambulates without assistance  Past Medical History:  Diagnosis Date   Cancer of upper lobe of left lung ([place]) 06/2018   Bx+ carcinoma, PET + mediastinal mets, R lung mets, pelvic region bone mets. MRI brain pending as of 3/3/.  Pt to see Dr. Earlie Server approx 3/10 to discuss plan.   Chronic renal insufficiency, stage 2 (mild)    GFR 60s   COPD (chronic obstructive pulmonary disease) (HCC)    Hyperlipidemia    Mild--TLC    Personal history of lung cancer 2008   2008; upper lobe R lung (Dr. Earlie Server).  Got chemo and rad.  Has been on observation and monitoring with annual CT's of chest since 2009.  2017 was "released" by onc.  06/2018->LUL mass;-->lung ca, +mets on PET scan.    Past Surgical History:  Procedure Laterality Date   LUNG BIOPSY  07/04/2018   LUL mass   MEDIASTINOSCOPY  2008   for bx of lung mass (Dr. Arlyce Dice)   TUBAL LIGATION      Social History   Socioeconomic History   Marital status: Married    Spouse name: Not on file   Number of children: Not on file   Years of education: Not on file   Highest education level: Not on file  Occupational History   Not on file  Social Needs   Financial resource strain: Not on file   Food insecurity:    Worry: Not on file    Inability: Not on file   Transportation needs:    Medical: Not on file    Non-medical: Not on file  Tobacco Use   Smoking status: Former Smoker    Types: Cigarettes    Last attempt to quit: 05/15/2002    Years since quitting: 16.2   Smokeless tobacco: Never Used  Substance and Sexual Activity   Alcohol use: Yes    Comment: occasional   Drug use: No  Sexual activity: Yes    Birth control/protection: Post-menopausal  Lifestyle   Physical activity:    Days per week: Not on file    Minutes per session: Not on file   Stress: Not on file  Relationships   Social connections:    Talks on phone: Not on file    Gets together: Not on file    Attends religious service: Not on file    Active member of club or organization: Not on file    Attends meetings of clubs or organizations: Not on file    Relationship status: Not on file   Intimate partner violence:    Fear of current or ex partner: Not on file    Emotionally abused: Not on file    Physically abused: Not on file    Forced sexual activity: Not on file  Other Topics Concern   Not on file  Social History Narrative   Married, one daughter.   HS  education.  Homemaker.   Former smoker; quit 2004.   No alcohol or drugs.     Exercise: walking     No Known Allergies  Family History  Problem Relation Age of Onset   Alcohol abuse Mother    Heart disease Mother    Alcohol abuse Father    Liver disease Brother     Prior to Admission medications   Medication Sig Start Date End Date Taking? Authorizing Provider  albuterol (ACCUNEB) 1.25 MG/3ML nebulizer solution Take 3 mLs (1.25 mg total) by nebulization every 6 (six) hours as needed. 02/28/18   McGowen, [name], MD  albuterol (VENTOLIN HFA) 108 (90 Base) MCG/ACT inhaler Inhale 1-2 puffs into the lungs every 4 (four) hours as needed for wheezing or shortness of breath. 05/13/18   McGowen, [name], MD  budesonide-formoterol (SYMBICORT) 160-4.5 MCG/ACT inhaler Inhale 2 puffs into the lungs 2 (two) times daily. 02/28/18   McGowen, [name], MD  clonazePAM (KLONOPIN) 0.5 MG tablet 1-2 tabs po qhs prn insomnia and worry Patient taking differently: Take 0.5-1 mg by mouth at bedtime as needed for anxiety. 1-2 tabs po qhs prn insomnia and worry 03/01/18   McGowen, [name], MD  gabapentin (NEURONTIN) 100 MG capsule Take 1 capsule (100 mg total) by mouth 3 (three) times daily. 07/29/18   Tanner, Lyndon Code., PA-C  ibuprofen (ADVIL,MOTRIN) 200 MG tablet Take 600 mg by mouth every 8 (eight) hours as needed.    [provider]  ipratropium (ATROVENT) 0.03 % nasal spray Place 2 sprays into both nostrils every 12 (twelve) hours. Patient taking differently: Place 2 sprays into both nostrils 2 (two) times daily as needed for rhinitis.  10/11/17   McGowen, [name], MD  methylPREDNISolone (MEDROL DOSEPAK) 4 MG TBPK tablet 6 x 1 day, 5 x 1 day, 4 x 1 day, 3 x 1 day, 2 x 1 day, 1 x 1 day 07/29/18   Harle Stanford., PA-C  prochlorperazine (COMPAZINE) 5 MG tablet 1 to 2 tablets PO QID prn nausea. Patient taking differently: Take 5-10 mg by mouth 4 (four) times daily as needed for nausea. 1 to 2 tablets  PO QID prn nausea. 07/31/18   Tanner, Lyndon Code., PA-C  traMADol (ULTRAM) 50 MG tablet Take 1 tablet (50 mg total) by mouth every 6 (six) hours as needed for moderate pain. 07/29/18   Harle Stanford., PA-C    Physical Exam: Vitals:   08/07/18 1430 08/07/18 1500 08/07/18 1530 08/07/18 1600  BP: 124/74 132/69 137/74  107/83  Pulse: (!) 122 (!) 126 (!) 127   Resp: (!) 23 (!) 26 (!) 27 (!) 24  Temp:      TempSrc:      SpO2: 94% 96% 95%   Weight:      Height:          General: Appears ill and SOB  Eyes:  PERRL, EOMI, normal lids, iris  ENT:  grossly normal hearing, lips & tongue, mmm; appropriate dentition; appears to have oropharyngeal thrush  Neck:  no LAD, masses or thyromegaly; fullness of neck veins cardiac vs. pulmonary  Cardiovascular:  Irregularly irregular with tachycardia,  No r/g, 3/6 systolic murmur, muffled sounds over left chest but this appears to be related to lung physiology. No LE edema.   Respiratory:   Diffuse rhonchi, reasonable air movement, mild tachypnea  Abdomen:  soft, NT, ND, NABS  Back:   normal alignment, no CVAT  Skin:  no rash or induration seen on limited exam  Musculoskeletal:  RUE edema along the entire arm  Psychiatric:  Blunted mood and affect, speech fluent and appropriate, AOx3  Neurologic:  CN 2-12 grossly intact, moves all extremities in coordinated fashion, sensation intact    Radiological Exams on Admission: Ct Angio Chest Pe W And/or Wo Contrast  Result Date: 08/07/2018 CLINICAL DATA:  Increasing shortness of breath. Worsening left shoulder pain. History of lung cancer. EXAM: CT ANGIOGRAPHY CHEST WITH CONTRAST TECHNIQUE: Multidetector CT imaging of the chest was performed using the standard protocol during bolus administration of intravenous contrast. Multiplanar CT image reconstructions and MIPs were obtained to evaluate the vascular anatomy. CONTRAST:  77m OMNIPAQUE IOHEXOL 350 MG/ML SOLN COMPARISON:  PET-CT dated July 11, 2018. CT  chest dated June 18, 2018. FINDINGS: Cardiovascular: Satisfactory opacification of the pulmonary arteries to the segmental level. No evidence of acute pulmonary embolism. There is a small, peripheral nonocclusive thrombus in the left lower lobe medial basal segmental pulmonary artery (series 4, image 62). Normal heart size. New small to moderate complex pericardial effusion. No thoracic aortic aneurysm or dissection. Coronary, aortic arch, and branch vessel atherosclerotic vascular disease. Mediastinum/Nodes: Soft tissue mass in the anterior mediastinum and chest wall invading the sternal manubrium is grossly unchanged, measuring 2.8 x 5.0 cm, previously 2.8 x 5.1 cm. The soft tissue mass versus lymphadenopathy in the left anterior cardiophrenic angle has increased in size, now measuring 6.0 x 4.0 cm, previously 4.2 x 3.1 cm. Unchanged rightward mediastinal shift. No enlarged mediastinal, hilar, or axillary lymph nodes. The thyroid gland, trachea, and esophagus demonstrate no significant findings. Lungs/Pleura: Left upper lobe mass invading the anterior chest wall at the anterior first intercostal space has increased in size, now measuring 5.4 x 4.1 cm, previously 5.0 x 3.6 cm. Two nodules in the right lower lobe have increased in size, now measuring 7 and 9 mm, respectively, previously 5 and 7 mm. A 6 mm nodule in the left lower lobe has also increased in size, previously measuring 4 mm. Increasing small to moderate left pleural effusion. New small to moderate right pleural effusion. Prior right upper lobectomy. Unchanged complete collapse of the right middle lobe. Chronic pleural thickening at the right lung apex is unchanged. Bilateral lower lobe atelectasis and scarring. Upper Abdomen: No acute abnormality. Musculoskeletal: New T2 mild compression fracture with mild heterogeneity of the vertebral body. Unchanged chronic moderate T4 and mild T11 compression deformities. Review of the MIP images confirms the  above findings. IMPRESSION: 1. No evidence of acute pulmonary embolism. Small, peripheral,  nonocclusive chronic thrombus in the left lower lobe medial basal segmental pulmonary artery. 2. Findings overall consistent with progression of disease with enlargement of the left upper lobe mass, left cardiophrenic angle mediastinal mass, and small pulmonary metastases in both lower lobes. Unchanged anterior mediastinal and chest wall metastasis involving the sternum. 3. New mild pathologic compression fracture of T2. 4. New small to moderate complex pericardial effusion. Increasing small to moderate bilateral pleural effusions. 5. Prior right upper lobectomy. Unchanged complete collapse of the right middle lobe. 6.  Aortic atherosclerosis (ICD10-I70.0). Electronically Signed   By: Titus Dubin M.D.   On: 08/07/2018 13:15   Dg Chest Portable 1 View  Result Date: 08/07/2018 CLINICAL DATA:  Shortness of breath, lung cancer, COPD, wheezing, pain in LEFT shoulder as well EXAM: PORTABLE CHEST 1 VIEW COMPARISON:  Portable exam 1128 hours compared to PET/CT 07/11/2018 FINDINGS: Enlargement of cardiac silhouette. Prominent RIGHT mediastinal soft tissue border is unchanged, appears related to volume loss in the RIGHT hemithorax with mediastinal shift to the RIGHT on prior PET-CT. Atherosclerotic calcification aorta. Pleuroparenchymal opacity at RIGHT apex unchanged. Again identified LEFT upper lobe mass approximately 4.9 x 4.6 cm. Bibasilar atelectasis. No acute infiltrate, pleural effusion or pneumothorax. Bones demineralized. IMPRESSION: Known LEFT upper lobe neoplasm. RIGHT upper lobe scarring and chronic pleuroparenchymal opacification with mediastinal shift to the RIGHT. COPD changes with bibasilar atelectasis. Electronically Signed   By: Lavonia Dana M.D.   On: 08/07/2018 11:56   Dg Shoulder Left Portable  Result Date: 08/07/2018 CLINICAL DATA:  Shortness of breath, lung cancer, COPD, wheezing, pain in LEFT shoulder  as well EXAM: LEFT SHOULDER - 1 VIEW COMPARISON:  None FINDINGS: Osseous demineralization. AC joint alignment normal. Visualized ribs unremarkable. No acute fracture, dislocation, bone destruction or sclerosis. LEFT upper lobe neoplasm again identified as well as LEFT basilar atelectasis. IMPRESSION: LEFT upper lobe pulmonary neoplasm. Osseous demineralization without acute bony findings. Electronically Signed   By: Lavonia Dana M.D.   On: 08/07/2018 11:53   Ue Venous Duplex (mc And Wl Only)  Result Date: 08/07/2018 UPPER VENOUS STUDY  Indications: Swelling Risk Factors: Lung cancer. Performing Technologist: June Leap RDMS, RVT  Examination Guidelines: A complete evaluation includes B-mode imaging, spectral Doppler, color Doppler, and power Doppler as needed of all accessible portions of each vessel. Bilateral testing is considered an integral part of a complete examination. Limited examinations for reoccurring indications may be performed as noted.  Right Findings: +----------+------------+---------+-----------+----------+---------------------+  RIGHT      Compressible Phasicity Spontaneous Properties        Summary         +----------+------------+---------+-----------+----------+---------------------+  IJV            None        No         No                                        +----------+------------+---------+-----------+----------+---------------------+  Subclavian     None        No         No                  flow is noted very  proximal vein only    +----------+------------+---------+-----------+----------+---------------------+  Axillary       None        No         No                                        +----------+------------+---------+-----------+----------+---------------------+  Brachial       None        No         No                                         +----------+------------+---------+-----------+----------+---------------------+  Radial         Full                                                             +----------+------------+---------+-----------+----------+---------------------+  Ulnar          Full                                                             +----------+------------+---------+-----------+----------+---------------------+  Cephalic       Full                                                             +----------+------------+---------+-----------+----------+---------------------+  Basilic        None                                                             +----------+------------+---------+-----------+----------+---------------------+  Left Findings: +----------+------------+---------+-----------+----------+-------+  LEFT       Compressible Phasicity Spontaneous Properties Summary  +----------+------------+---------+-----------+----------+-------+  Subclavian     Full        Yes        Yes                         +----------+------------+---------+-----------+----------+-------+  Summary:  Right: Findings consistent with acute deep vein thrombosis involving the right internal jugular veins, right subclavian veins, right axillary vein and right brachial veins. Findings consistent with acute superficial vein thrombosis involving the right basilic vein.  Left: No evidence of thrombosis in the subclavian.  *See table(s) above for measurements and observations.  Diagnosing physician: Curt Jews MD Electronically signed by Curt Jews MD on 08/07/2018 at 2:30:25 PM.    Final     EKG: Independently reviewed.   1107 - Afib with rate 196; nonspecific ST changes with no evidence of acute ischemia 1657 - Afib read as "extreme  tachycardia" but there is no clear alternans and current HR is 121 and BP is 121/87  Labs on Admission: I have personally reviewed the available labs and imaging studies at the time of the admission.  Pertinent  labs:   Na++ 130 Glucose 130 Troponin <0.03   Assessment/Plan Principal Problem:   Acute on chronic respiratory failure with hypoxia (HCC) Active Problems:   Non-small cell carcinoma of left lung, stage 4 (HCC)   Pericardial effusion with cardiac tamponade   Acute deep vein thrombosis (DVT) of right upper extremity (HCC)   Acute on chronic respiratory failure with hypoxia -Patient with known h/o COPD and lung cancer presenting with worsening SOB -CT shows progressive cancer despite recent chemo/rads -Additionally, she was found to have a moderate pericardial effusion -Initial plan was for admission to [facility] - until echo was read (see below)  Malignant pericardial effusion with evolving tamponade -STAT echo was requested -Dr. Margaretann Loveless called to report that she has a moderate sized circumferential effusion, "Teetering on tamponade physiology - looks like RV diastolic collapse.  She is either in the early phase of tamponade or getting there quickly".   -She recommends that the patient go to the [facility] ICU if stable to transfer.   -Dr. Margaretann Loveless then called and discussed with Dr. Angelena Form, who recommends calling surgeon for a window given malignant effusion.   -Will transfer to [place] at [facility] - I called and discussed with Dr. Ander Slade, who will accept the patient in transfer.   -Will give IVF and nothing to lower BP for now to stabilize for transfer. -I have called and left a consult request for Dr. Prescott Gum, as well.  Acute RUE DVT, extensive -The patient has an extensive R-sided UE DVT involving the large vessels, as well: "Findings consistent with acute deep vein thrombosis involving the right internal jugular veins, right subclavian veins, right axillary vein and right brachial veins. Findings consistent with acute superficial vein thrombosis involving the right basilic vein." -She was started on a heparin drip for this issue and may require intervention here, as well. -Assuming her other issues  stabilize, given her ongoing malignancy, it appears that she will need lifelong Lovenox injections.  NSCLC -Last seen on 3/10 by Dr. Julien Nordmann for recent diagnosis of high-grade malignancy suspicious for poorly differentiated carcinoma -He explained that this is very aggressive and incurable -She was offered hospice referral and declined -She was started on palliative chemotherapy and radiation therapy  -Despite initiation of treatment, her cancer appears to have progressed by today's imaging and she is now facing multiple other complications -Her overall prognosis appears to be poor at this time    DVT prophylaxis: Heparin drip Code Status:  DNR - confirmed with patient Family Communication: ER PA spoke with her daughter on multiple occasions  Disposition Plan: To be determined Consults called: Oncology; CVTS; Cardiology; PCCM Admission status: Admit - It is my clinical opinion that admission to INPATIENT is reasonable and necessary because of the expectation that this patient will require hospital care that crosses at least 2 midnights to treat this condition based on the medical complexity of the problems presented.  Given the aforementioned information, the predictability of an adverse outcome is felt to be significant.   Total critical care time: 95 minutes Critical care time was exclusive of separately billable procedures and treating other patients. Critical care was necessary to treat or prevent imminent or life-threatening deterioration. Critical care was time spent personally by me on the following activities: development of  treatment plan with patient and/or surrogate as well as nursing, discussions with consultants, evaluation of patient's response to treatment, examination of patient, obtaining history from patient or surrogate, ordering and performing treatments and interventions, ordering and review of laboratory studies, ordering and review of radiographic studies, pulse oximetry  and re-evaluation of patient's condition.   Karmen Bongo MD [facility]   How to contact the [facility] Attending or Consulting provider [place] or covering provider during after hours [place], for this patient?  1. Check the care team in [facility] and look for a) attending/consulting [facility] provider listed and b) the [facility] team listed 2. Log into [email or web address] and use [place]'s universal password to access. If you do not have the password, please contact the hospital operator. 3. Locate the [facility] provider you are looking for under [facility] and page to a number that you can be directly reached. 4. If you still have difficulty reaching the provider, please page the [facility] (Director on Call) for the Hospitalists listed on amion for assistance.   08/07/2018, 5:10 PM

## 2018-08-07 NOTE — Progress Notes (Signed)
RT attempted to draw ABG, but [facility] ready to take patient to [facility]. [facility] Charge RT called and made aware of pending ABG.

## 2018-08-07 NOTE — ED Notes (Signed)
EDPA Provider at bedside. 

## 2018-08-07 NOTE — Progress Notes (Signed)
*  PRELIMINARY RESULTS* Echocardiogram 2D Echocardiogram has been performed.  Leah Howell 08/07/2018, 4:36 PM

## 2018-08-08 ENCOUNTER — Other Ambulatory Visit: Payer: Self-pay

## 2018-08-08 ENCOUNTER — Encounter (HOSPITAL_COMMUNITY)
Admission: EM | Disposition: A | Discharge status: Hospice | Payer: Self-pay | Source: Home / Self Care | Attending: Internal Medicine

## 2018-08-08 ENCOUNTER — Inpatient Hospital Stay (HOSPITAL_COMMUNITY): Payer: Medicare Other | Admitting: Certified Registered Nurse Anesthetist

## 2018-08-08 ENCOUNTER — Encounter (HOSPITAL_COMMUNITY): Payer: Self-pay | Admitting: Certified Registered"

## 2018-08-08 ENCOUNTER — Ambulatory Visit: Payer: Medicare Other

## 2018-08-08 ENCOUNTER — Inpatient Hospital Stay (HOSPITAL_COMMUNITY): Payer: Medicare Other

## 2018-08-08 ENCOUNTER — Telehealth: Payer: Self-pay | Admitting: Medical Oncology

## 2018-08-08 DIAGNOSIS — I313 Pericardial effusion (noninflammatory): Secondary | ICD-10-CM

## 2018-08-08 DIAGNOSIS — J810 Acute pulmonary edema: Secondary | ICD-10-CM

## 2018-08-08 DIAGNOSIS — I82621 Acute embolism and thrombosis of deep veins of right upper extremity: Secondary | ICD-10-CM

## 2018-08-08 DIAGNOSIS — I314 Cardiac tamponade: Secondary | ICD-10-CM

## 2018-08-08 DIAGNOSIS — J9621 Acute and chronic respiratory failure with hypoxia: Secondary | ICD-10-CM

## 2018-08-08 LAB — CBC
HCT: 39 % (ref 36.0–46.0)
Hemoglobin: 12.7 g/dL (ref 12.0–15.0)
MCH: 32.2 pg (ref 26.0–34.0)
MCHC: 32.6 g/dL (ref 30.0–36.0)
MCV: 99 fL (ref 80.0–100.0)
Platelets: 159 10*3/uL (ref 150–400)
RBC: 3.94 MIL/uL (ref 3.87–5.11)
RDW: 12.5 % (ref 11.5–15.5)
WBC: 6.9 10*3/uL (ref 4.0–10.5)
nRBC: 0 % (ref 0.0–0.2)

## 2018-08-08 LAB — PROTIME-INR
INR: 1 (ref 0.8–1.2)
Prothrombin Time: 13.3 seconds (ref 11.4–15.2)

## 2018-08-08 LAB — PREPARE RBC (CROSSMATCH)

## 2018-08-08 LAB — ECHO INTRAOPERATIVE TEE
Height: 66 in
Weight: 1950.63 oz

## 2018-08-08 LAB — APTT: aPTT: 74 seconds — ABNORMAL HIGH (ref 24–36)

## 2018-08-08 SURGERY — IMPLANTATION, AORTIC VALVE, TRANSCATHETER, SUBCLAVIAN ARTERY APPROACH
Anesthesia: General

## 2018-08-08 MED ORDER — DEXAMETHASONE SODIUM PHOSPHATE 10 MG/ML IJ SOLN
INTRAMUSCULAR | Status: DC | PRN
  Administered 2018-08-08: 10 mg via INTRAVENOUS

## 2018-08-08 MED ORDER — SENNOSIDES-DOCUSATE SODIUM 8.6-50 MG PO TABS
1.0000 | ORAL_TABLET | Freq: Every day | ORAL | Status: DC
  Administered 2018-08-08 – 2018-08-10 (×3): 1 via ORAL
  Filled 2018-08-08 (×3): qty 1

## 2018-08-08 MED ORDER — LIDOCAINE 2% (20 MG/ML) 5 ML SYRINGE
INTRAMUSCULAR | Status: AC
  Filled 2018-08-08: qty 5

## 2018-08-08 MED ORDER — BISACODYL 5 MG PO TBEC
10.0000 mg | DELAYED_RELEASE_TABLET | Freq: Every day | ORAL | Status: DC
  Administered 2018-08-09 – 2018-08-11 (×3): 10 mg via ORAL
  Filled 2018-08-08 (×4): qty 2

## 2018-08-08 MED ORDER — SODIUM CHLORIDE 0.9 % IV SOLN
INTRAVENOUS | Status: DC | PRN
  Administered 2018-08-08: 40 ug/min via INTRAVENOUS

## 2018-08-08 MED ORDER — CEFAZOLIN SODIUM-DEXTROSE 2-4 GM/100ML-% IV SOLN
2.0000 g | Freq: Three times a day (TID) | INTRAVENOUS | Status: AC
  Administered 2018-08-08 (×2): 2 g via INTRAVENOUS
  Filled 2018-08-08 (×3): qty 100

## 2018-08-08 MED ORDER — ONDANSETRON HCL 4 MG/2ML IJ SOLN
INTRAMUSCULAR | Status: AC
  Filled 2018-08-08: qty 2

## 2018-08-08 MED ORDER — FENTANYL CITRATE (PF) 250 MCG/5ML IJ SOLN
INTRAMUSCULAR | Status: DC | PRN
  Administered 2018-08-08 (×2): 50 ug via INTRAVENOUS
  Administered 2018-08-08: 100 ug via INTRAVENOUS

## 2018-08-08 MED ORDER — EPHEDRINE 5 MG/ML INJ
INTRAVENOUS | Status: AC
  Filled 2018-08-08: qty 20

## 2018-08-08 MED ORDER — SODIUM CHLORIDE 0.9 % IV SOLN
INTRAVENOUS | Status: DC
  Administered 2018-08-08: 12:00:00 via INTRAVENOUS

## 2018-08-08 MED ORDER — ETOMIDATE 2 MG/ML IV SOLN
INTRAVENOUS | Status: DC | PRN
  Administered 2018-08-08: 10 mg via INTRAVENOUS

## 2018-08-08 MED ORDER — ACETAMINOPHEN 160 MG/5ML PO SOLN
1000.0000 mg | Freq: Four times a day (QID) | ORAL | Status: DC
  Administered 2018-08-08 – 2018-08-09 (×3): 1000 mg via ORAL
  Filled 2018-08-08 (×3): qty 40.6

## 2018-08-08 MED ORDER — MIDAZOLAM HCL 2 MG/2ML IJ SOLN
INTRAMUSCULAR | Status: AC
  Filled 2018-08-08: qty 2

## 2018-08-08 MED ORDER — MIDAZOLAM HCL 5 MG/5ML IJ SOLN
INTRAMUSCULAR | Status: DC | PRN
  Administered 2018-08-08: 1 mg via INTRAVENOUS

## 2018-08-08 MED ORDER — LIDOCAINE 2% (20 MG/ML) 5 ML SYRINGE
INTRAMUSCULAR | Status: DC | PRN
  Administered 2018-08-08: 60 mg via INTRAVENOUS

## 2018-08-08 MED ORDER — ONDANSETRON HCL 4 MG/2ML IJ SOLN: 4.0000 mg | Freq: Four times a day (QID) | INTRAMUSCULAR | Status: DC | PRN

## 2018-08-08 MED ORDER — EPHEDRINE SULFATE-NACL 50-0.9 MG/10ML-% IV SOSY
PREFILLED_SYRINGE | INTRAVENOUS | Status: DC | PRN
  Administered 2018-08-08 (×3): 15 mg via INTRAVENOUS
  Administered 2018-08-08: 5 mg via INTRAVENOUS
  Administered 2018-08-08: 50 mg via INTRAVENOUS

## 2018-08-08 MED ORDER — 0.9 % SODIUM CHLORIDE (POUR BTL) OPTIME
TOPICAL | Status: DC | PRN
  Administered 2018-08-08: 1000 mL

## 2018-08-08 MED ORDER — POTASSIUM CHLORIDE 10 MEQ/50ML IV SOLN: 10.0000 meq | Freq: Every day | INTRAVENOUS | Status: DC | PRN

## 2018-08-08 MED ORDER — DEXAMETHASONE SODIUM PHOSPHATE 10 MG/ML IJ SOLN
INTRAMUSCULAR | Status: AC
  Filled 2018-08-08: qty 1

## 2018-08-08 MED ORDER — ROCURONIUM BROMIDE 50 MG/5ML IV SOSY
PREFILLED_SYRINGE | INTRAVENOUS | Status: DC | PRN
  Administered 2018-08-08: 50 mg via INTRAVENOUS
  Administered 2018-08-08: 40 mg via INTRAVENOUS

## 2018-08-08 MED ORDER — OXYCODONE HCL 5 MG PO TABS
5.0000 mg | ORAL_TABLET | ORAL | Status: DC | PRN
  Administered 2018-08-08 – 2018-08-10 (×6): 10 mg via ORAL
  Filled 2018-08-08 (×2): qty 2
  Filled 2018-08-08: qty 1
  Filled 2018-08-08 (×3): qty 2
  Filled 2018-08-08: qty 1

## 2018-08-08 MED ORDER — ETOMIDATE 2 MG/ML IV SOLN
INTRAVENOUS | Status: AC
  Filled 2018-08-08: qty 10

## 2018-08-08 MED ORDER — ACETAMINOPHEN 500 MG PO TABS
1000.0000 mg | ORAL_TABLET | Freq: Four times a day (QID) | ORAL | Status: DC
  Administered 2018-08-09: 1000 mg via ORAL
  Filled 2018-08-08 (×2): qty 2

## 2018-08-08 MED ORDER — PROPOFOL 10 MG/ML IV BOLUS
INTRAVENOUS | Status: AC
  Filled 2018-08-08: qty 20

## 2018-08-08 MED ORDER — TRAMADOL HCL 50 MG PO TABS
50.0000 mg | ORAL_TABLET | Freq: Four times a day (QID) | ORAL | Status: DC | PRN
  Administered 2018-08-09: 100 mg via ORAL
  Filled 2018-08-08: qty 2

## 2018-08-08 MED ORDER — FENTANYL CITRATE (PF) 250 MCG/5ML IJ SOLN
INTRAMUSCULAR | Status: AC
  Filled 2018-08-08: qty 5

## 2018-08-08 MED ORDER — SUGAMMADEX SODIUM 200 MG/2ML IV SOLN
INTRAVENOUS | Status: DC | PRN
  Administered 2018-08-08: 150 mg via INTRAVENOUS

## 2018-08-08 MED ORDER — ALBUMIN HUMAN 5 % IV SOLN
INTRAVENOUS | Status: DC | PRN
  Administered 2018-08-08: 07:00:00 via INTRAVENOUS

## 2018-08-08 MED ORDER — HEPARIN (PORCINE) 25000 UT/250ML-% IV SOLN
900.0000 [IU]/h | INTRAVENOUS | Status: DC
  Administered 2018-08-09 – 2018-08-10 (×2): 900 [IU]/h via INTRAVENOUS
  Filled 2018-08-08 (×2): qty 250

## 2018-08-08 MED ORDER — EPHEDRINE 5 MG/ML INJ
INTRAVENOUS | Status: AC
  Filled 2018-08-08: qty 10

## 2018-08-08 MED ORDER — ONDANSETRON HCL 4 MG/2ML IJ SOLN
INTRAMUSCULAR | Status: DC | PRN
  Administered 2018-08-08: 4 mg via INTRAVENOUS

## 2018-08-08 MED ORDER — LACTATED RINGERS IV SOLN
INTRAVENOUS | Status: DC | PRN
  Administered 2018-08-08: 08:00:00 via INTRAVENOUS

## 2018-08-08 MED ORDER — LACTATED RINGERS IV SOLN
INTRAVENOUS | Status: DC | PRN
  Administered 2018-08-08: 07:00:00 via INTRAVENOUS

## 2018-08-08 MED ORDER — CEFAZOLIN SODIUM 1 G IJ SOLR
INTRAMUSCULAR | Status: AC
  Filled 2018-08-08: qty 20

## 2018-08-08 MED ORDER — FENTANYL CITRATE (PF) 100 MCG/2ML IJ SOLN
25.0000 ug | INTRAMUSCULAR | Status: DC | PRN
  Administered 2018-08-08 – 2018-08-10 (×7): 50 ug via INTRAVENOUS
  Filled 2018-08-08 (×7): qty 2

## 2018-08-08 SURGICAL SUPPLY — 43 items
BENZOIN TINCTURE PRP APPL 2/3 (GAUZE/BANDAGES/DRESSINGS) IMPLANT
CANISTER SUCT 3000ML PPV (MISCELLANEOUS) ×3 IMPLANT
CATH THORACIC 28FR (CATHETERS) IMPLANT
CATH THORACIC 28FR RT ANG (CATHETERS) IMPLANT
CATH THORACIC 36FR (CATHETERS) IMPLANT
CATH THORACIC 36FR RT ANG (CATHETERS) IMPLANT
CLOSURE WOUND 1/2 X4 (GAUZE/BANDAGES/DRESSINGS)
CONN ST 1/4X3/8  BEN (MISCELLANEOUS) ×2
CONN ST 1/4X3/8 BEN (MISCELLANEOUS) ×1 IMPLANT
CONT SPEC 4OZ CLIKSEAL STRL BL (MISCELLANEOUS) ×9 IMPLANT
COVER SURGICAL LIGHT HANDLE (MISCELLANEOUS) ×6 IMPLANT
COVER WAND RF STERILE (DRAPES) IMPLANT
DRAIN CHANNEL 28F RND 3/8 FF (WOUND CARE) ×3 IMPLANT
DRAPE HALF SHEET 40X57 (DRAPES) ×3 IMPLANT
DRAPE LAPAROSCOPIC ABDOMINAL (DRAPES) ×3 IMPLANT
ELECT REM PT RETURN 9FT ADLT (ELECTROSURGICAL) ×3
ELECTRODE REM PT RTRN 9FT ADLT (ELECTROSURGICAL) ×1 IMPLANT
GAUZE SPONGE 4X4 12PLY STRL (GAUZE/BANDAGES/DRESSINGS) ×3 IMPLANT
GLOVE BIO SURGEON STRL SZ7.5 (GLOVE) ×6 IMPLANT
HEMOSTAT POWDER SURGIFOAM 1G (HEMOSTASIS) IMPLANT
KIT BASIN OR (CUSTOM PROCEDURE TRAY) ×3 IMPLANT
KIT TURNOVER KIT B (KITS) ×3 IMPLANT
NS IRRIG 1000ML POUR BTL (IV SOLUTION) ×3 IMPLANT
PACK CHEST (CUSTOM PROCEDURE TRAY) ×3 IMPLANT
PAD ARMBOARD 7.5X6 YLW CONV (MISCELLANEOUS) ×6 IMPLANT
PAD ELECT DEFIB RADIOL ZOLL (MISCELLANEOUS) ×3 IMPLANT
STRIP CLOSURE SKIN 1/2X4 (GAUZE/BANDAGES/DRESSINGS) IMPLANT
SUT SILK 2 0 SH CR/8 (SUTURE) IMPLANT
SUT VIC AB 1 CTX 18 (SUTURE) ×3 IMPLANT
SUT VIC AB 1 CTX 36 (SUTURE) ×2
SUT VIC AB 1 CTX36XBRD ANBCTR (SUTURE) ×1 IMPLANT
SUT VIC AB 3-0 X1 27 (SUTURE) ×3 IMPLANT
SWAB COLLECTION DEVICE MRSA (MISCELLANEOUS) IMPLANT
SWAB CULTURE ESWAB REG 1ML (MISCELLANEOUS) ×3 IMPLANT
SYR 10ML LL (SYRINGE) IMPLANT
SYR 50ML SLIP (SYRINGE) IMPLANT
SYSTEM SAHARA CHEST DRAIN ATS (WOUND CARE) ×3 IMPLANT
TOWEL GREEN STERILE (TOWEL DISPOSABLE) ×3 IMPLANT
TOWEL GREEN STERILE FF (TOWEL DISPOSABLE) ×3 IMPLANT
TRAP SPECIMEN MUCOUS 40CC (MISCELLANEOUS) ×3 IMPLANT
TRAY FOLEY MTR SLVR 14FR STAT (SET/KITS/TRAYS/PACK) ×3 IMPLANT
TRAY FOLEY SLVR 16FR TEMP STAT (SET/KITS/TRAYS/PACK) IMPLANT
WATER STERILE IRR 1000ML POUR (IV SOLUTION) ×6 IMPLANT

## 2018-08-08 NOTE — Telephone Encounter (Signed)
Update from dtr-8 oz of fluid removed from around [name] heart and has a drain in place. heart. She cancelled appts for next week. She said her mom may be discharged on Monday.

## 2018-08-08 NOTE — Anesthesia Procedure Notes (Signed)
Arterial Line Insertion Start/End3/26/2020 6:43 AM, 08/08/2018 7:00 AM Performed by: Neldon Newport, CRNA, CRNA  Patient location: Pre-op. Preanesthetic checklist: patient identified, IV checked, site marked, risks and benefits discussed, monitors and equipment checked, pre-op evaluation, timeout performed and anesthesia consent Right, radial was placed Catheter size: 20 G Hand hygiene performed  and maximum sterile barriers used  Allen's test indicative of satisfactory collateral circulation Attempts: 1 Procedure performed without using ultrasound guided technique. Following insertion, dressing applied and Biopatch. Post procedure assessment: normal  Patient tolerated the procedure well with no immediate complications.

## 2018-08-08 NOTE — Consult Note (Addendum)
NAME:  Leah Howell, MRN:  [id number], DOB:  [birth year], LOS: 1 ADMISSION DATE:  08/07/2018, CONSULTATION DATE:  08/08/2018 REFERRING MD:  Nile Riggs Lorin Mercy, CHIEF COMPLAINT:  Pericardial tamponade   Brief History   73 year old female with history of breast cancer transferred from [facility] to [facility] for a pericardial window due to a pericardial tamponade.  Patient was very ill on 3/25 and is much better now.  She is sedate post op and not answering any questions so history obtained from records and admission H&P.  History of present illness   73 year old female with history of breast cancer transferred from [facility] to [facility] for a pericardial window due to a pericardial tamponade.  Patient was very ill on 3/25 and is much better now.  She is sedate post op and not answering any questions so history obtained from records and admission H&P.  Past Medical History  Breast cancer  Significant Hospital Events   3/26 pericardial window  Consults:  Cards  Procedures:  CVTS  Significant Diagnostic Tests:  Cytology on pericardial fluid  Micro Data:  N/A  Antimicrobials:  Ancef 3/26 post op   Interim history/subjective:  No complaints  Objective   Blood pressure 138/68, pulse (!) 110, temperature 97.6 F (36.4 C), resp. rate (!) 31, height 5\' 6"  (1.676 m), weight 55.3 kg, SpO2 94 %.        Intake/Output Summary (Last 24 hours) at 08/08/2018 1137 Last data filed at 08/08/2018 1000 Gross per 24 hour  Intake 1701.38 ml  Output 874 ml  Net 827.38 ml   Filed Weights   08/07/18 1107 08/08/18 0500  Weight: 52.8 kg 55.3 kg    Examination: General: Well appearing, NAD HENT: [place]/AT, PERRL, EOM-I and MMM Lungs: CTA bilaterally Cardiovascular: RRR, Nl S1/S2 and -M/R/G Abdomen: Soft, NT, ND and +BS Extremities: -edema and -tenderness Neuro: Sedate but arousable and protecting her airway, moving all ext to commands Skin: incision noted and clean  I reviewed CXR myself, pulmonary edema noted   Resolved Hospital Problem list   Pericardial tamponade  Assessment & Plan:  73 year old female with breast cancer history presenting with pericardial tamponade s/p pericardial window.  Discussed with PCCM-NP.  Pericardial tamponade: likely breast cancer related  - S/P pericardial window  - CVTS managing  COPD:  - Dulera  - Atrovent  - PRN albuterol  Post op pain:  - PRN fentanyl  Hyperglycemia:  - ISS  - CBGs  Acute pulmonary edema:  - KVO IVF  - Hold off lasix for now  Breast cancer:  - F/U as outpatient  DVT:  - Heparin drip when ok with CVTS  Dispo:  - Transfer to progressive care and [facility] with PCCM off 3/27.  Labs   CBC: Recent Labs  Lab 08/05/18 1138 08/07/18 1147 08/07/18 2253 08/08/18 0506  WBC 5.2 10.3 7.8 6.9  NEUTROABS 4.2 9.5*  --   --   HGB 14.0 13.4 13.3 12.7  HCT 42.4 40.4 39.5 39.0  MCV 100.5* 100.5* 99.2 99.0  PLT 194 193 139* 160    Basic Metabolic Panel: Recent Labs  Lab 08/05/18 1138 08/07/18 1147 08/07/18 2253  NA 134* 130* 130*  K 3.9 4.5 4.8  CL 98 95* 97*  CO2 21* 24 21*  GLUCOSE 96 130* 130*  BUN 9 24* 18  CREATININE 0.75 0.80 0.73  CALCIUM 9.3 8.8* 8.7*   GFR: Estimated Creatinine Clearance: 56.3 mL/min (by C-G formula based  on SCr of 0.73 mg/dL). Recent Labs  Lab 08/05/18 1138 08/07/18 1147 08/07/18 2253 08/08/18 0506  WBC 5.2 10.3 7.8 6.9    Liver Function Tests: Recent Labs  Lab 08/05/18 1138 08/07/18 1147 08/07/18 2253  AST 21 33 27  ALT 20 31 26   ALKPHOS 91 68 58  BILITOT 0.8 0.8 0.9  PROT 6.9 6.1* 5.6*  ALBUMIN 3.8 3.5 3.0*   No results for input(s): LIPASE, AMYLASE in the last 168 hours. No results for input(s): AMMONIA in the last 168 hours.  ABG No results found for: PHART, PCO2ART, PO2ART, HCO3, TCO2, ACIDBASEDEF, O2SAT   Coagulation Profile: Recent Labs  Lab 08/08/18 0506  INR 1.0    Cardiac Enzymes: Recent Labs  Lab 08/07/18 1150  TROPONINI <0.03    HbA1C: No  results found for: HGBA1C  CBG: No results for input(s): GLUCAP in the last 168 hours.  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   Surgical History    Past Surgical History:  Procedure Laterality Date  . LUNG BIOPSY  07/04/2018   LUL mass  . MEDIASTINOSCOPY  2008   for bx of lung mass (Dr. Arlyce Dice)  . TUBAL LIGATION       Social History   reports that she quit smoking about 16 years ago. Her smoking use included cigarettes. She has never used smokeless tobacco. She reports current alcohol use. She reports that she does not use drugs.   Family History   Her family history includes Alcohol abuse in her father and mother; Heart disease in her mother; Liver disease in her brother.   Allergies No Known Allergies   Home Medications  Prior to Admission medications   Medication Sig Start Date End Date Taking? Authorizing Provider  albuterol (ACCUNEB) 1.25 MG/3ML nebulizer solution Take 3 mLs (1.25 mg total) by nebulization every 6 (six) hours as needed. 02/28/18  Yes McGowen, [name], MD  albuterol (VENTOLIN HFA) 108 (90 Base) MCG/ACT inhaler Inhale 1-2 puffs into the lungs every 4 (four) hours as needed for wheezing or shortness of breath. 05/13/18  Yes McGowen, [name], MD  budesonide-formoterol [facility]) 160-4.5 MCG/ACT inhaler Inhale 2 puffs into the lungs 2 (two) times daily. 02/28/18  Yes McGowen, [name], MD  clonazePAM (KLONOPIN) 0.5 MG tablet 1-2 tabs po qhs prn insomnia and worry Patient taking differently: Take 0.5-1 mg by mouth at bedtime as needed for anxiety. 1-2 tabs po qhs prn insomnia and worry 03/01/18  Yes McGowen, [name], MD  gabapentin (NEURONTIN) 100 MG capsule Take 1 capsule (100 mg total) by  mouth 3 (three) times daily. 07/29/18  Yes Tanner, Lyndon Code., PA-C  guaiFENesin (MUCINEX) 600 MG 12 hr tablet Take 600 mg by mouth 2 (two) times daily as needed for cough or to loosen phlegm.   Yes [provider]  ibuprofen (ADVIL,MOTRIN) 200 MG tablet Take 600 mg by mouth every 8 (eight) hours as needed.   Yes [provider]  ipratropium (ATROVENT) 0.03 % nasal spray Place 2 sprays into both nostrils every 12 (twelve) hours. Patient taking differently: Place 2 sprays into both nostrils 2 (two) times daily as needed for rhinitis.  10/11/17  Yes McGowen, [name], MD  prochlorperazine (COMPAZINE) 5 MG tablet 1 to  2 tablets PO QID prn nausea. Patient taking differently: Take 5-10 mg by mouth 4 (four) times daily as needed for nausea. 1 to 2 tablets PO QID prn nausea. 07/31/18  Yes Tanner, Lyndon Code., PA-C  traMADol (ULTRAM) 50 MG tablet Take 1 tablet (50 mg total) by mouth every 6 (six) hours as needed for moderate pain. 07/29/18  Yes Tanner, Lyndon Code., PA-C  methylPREDNISolone (MEDROL DOSEPAK) 4 MG TBPK tablet 6 x 1 day, 5 x 1 day, 4 x 1 day, 3 x 1 day, 2 x 1 day, 1 x 1 day 07/29/18   Harle Stanford., PA-C    Rush Farmer, M.D. [facility] Pulmonary/Critical Care Medicine. Pager: [phone]. After hours pager: [phone].

## 2018-08-08 NOTE — Brief Op Note (Signed)
08/07/2018 - 08/08/2018  8:59 AM  PATIENT:  Leah Howell  73 y.o. female  PRE-OPERATIVE DIAGNOSIS: Pericardial effusion, History of Lung Cancer  POST-OPERATIVE DIAGNOSIS:  Pericardial effusion, History of Lung Cancer  PROCEDURE: Sub-xyphoid drainage of Pericardial Effusion, approximately 227ml bloody fluid.  SURGEON and Role:   Ivin Poot, MD - Primary  PHYSICIAN ASSISTANT: Michael Litter  ANESTHESIA:   general  EBL:  <2ml  BLOOD ADMINISTERED:none  DRAINS: 30fr Blake mediastinal drain x 1   LOCAL MEDICATIONS USED:  NONE  SPECIMEN: Pericardial fluid for CX, AFB, fungal, cytology.  Pericardium for CX and histology.  DISPOSITION OF SPECIMEN:  PATHOLOGY  COUNTS:  YES  DICTATION: .Dragon Dictation  PLAN OF CARE: Admit to inpatient   PATIENT DISPOSITION:  PACU - hemodynamically stable.   Delay start of Pharmacological VTE agent (>24hrs) due to surgical blood loss or risk of bleeding: yes

## 2018-08-08 NOTE — Progress Notes (Signed)
ANTICOAGULATION CONSULT NOTE - Initial Consult  Pharmacy Consult for heparin Indication: DVT  No Known Allergies  Patient Measurements: Height: 5\' 6"  (167.6 cm) Weight: 121 lb 14.6 oz (55.3 kg) IBW/kg (Calculated) : 59.3 Heparin Dosing Weight: 53kg  Vital Signs: Temp: 97.6 F (36.4 C) (03/26 0926) Temp Source: Oral (03/26 0330) BP: 135/60 (03/26 0939) Pulse Rate: 110 (03/26 0945)  Labs: Recent Labs    08/05/18 1138  08/07/18 1147 08/07/18 1150 08/07/18 2253 08/08/18 0506  HGB 14.0   < > 13.4  --  13.3 12.7  HCT 42.4  --  40.4  --  39.5 39.0  PLT 194  --  193  --  139* 159  APTT  --   --   --   --   --  74*  LABPROT  --   --   --   --   --  13.3  INR  --   --   --   --   --  1.0  HEPARINUNFRC  --   --   --   --  0.23*  --   CREATININE 0.75  --  0.80  --  0.73  --   TROPONINI  --   --   --  <0.03  --   --    < > = values in this interval not displayed.    Estimated Creatinine Clearance: 56.3 mL/min (by C-G formula based on SCr of 0.73 mg/dL).   Medical History: Past Medical History:  Diagnosis Date  . Cancer of upper lobe of left lung ([place]) 06/2018   Bx+ carcinoma, PET + mediastinal mets, R lung mets, pelvic region bone mets. MRI brain pending as of 3/3/.  Pt to see Dr. Earlie Server approx 3/10 to discuss plan.  . Chronic renal insufficiency, stage 2 (mild)    GFR 60s  . COPD (chronic obstructive pulmonary disease) ([place])   . Hyperlipidemia    Mild--TLC  . Personal history of lung cancer 2008   2008; upper lobe R lung (Dr. Earlie Server).  Got chemo and rad.  Has been on observation and monitoring with annual CT's of chest since 2009.  2017 was "released" by onc.  06/2018->LUL mass;-->lung ca, +mets on PET scan.     Assessment: 28 yoF admitted with RUE found to have extensive DVT in setting of lung cancer. CT negative for PE, pt was started on IV heparin. Pt noted to have early tamponade and is now s/p subxiphoid pericardial window. Pharmacy asked to resume heparin with  no bolus tomorrow 3/27 around 1000 and keep anti-Xa level around 0.2-0.3.   Goal of Therapy:  Heparin level 0.2-0.3 units/ml Monitor platelets by anticoagulation protocol: Yes   Plan:  -Resume heparin 900 units/hr starting tomorrow 3/27 at 1000 with no bolus -Will check 8hr heparin level  Arrie Senate, PharmD, BCPS Clinical Pharmacist [phone] Please check AMION for all [facility] numbers 08/08/2018

## 2018-08-08 NOTE — Progress Notes (Signed)
[facility] will sign off.   Medication Recommendations: None Other recommendations (labs, testing, etc): None Follow up as an outpatient: Will not be required

## 2018-08-08 NOTE — Op Note (Signed)
NAME: Leah Howell, [place] MEDICAL RECORD WN:[id number] ACCOUNT [id number] DATE OF BIRTH:[birth year] FACILITY: [facility] LOCATION: [facility] PHYSICIAN:PETER VAN TRIGT III, MD  OPERATIVE REPORT  DATE OF PROCEDURE:  08/08/2018  OPERATION:  Subxiphoid pericardial window and drainage of pericardial effusion--300 mL of bloody fluid.  SURGEON:  Len Childs, MD  ASSISTANT:  Enid Cutter, PA-C  PREOPERATIVE DIAGNOSES:  History of nonsmall-cell carcinoma of the left lung, pericarditis with 3 tamponade.  POSTOPERATIVE DIAGNOSES:   History of nonsmall cell carcinoma of the left lung, pericarditis with 3 tamponade.  ANESTHESIA:  General by Dr. Adele Barthel  CLINICAL NOTE:  The patient is a 73 year old female of Dr. 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  On Monday of this week, she had chemotherapy, as well as radiation therapy.  The patient has had 48 hours of  progressive shortness of breath and poor exercise tolerance.  She is orthopneic.  She was sent to the ED for evaluation and found to be tachycardic with an enlarged cardiac silhouette on x-ray.  CT scan showed no pulmonary embolus.  Vascular ultrasound  showed thrombosis in the right subclavian vein and right IJ.  Echocardiogram confirmed a moderate-to-large circumferential pericardial effusion noted on the CT scan.  She had good biventricular function.  Because of her symptoms and moderate-to-large  pericardial effusion and history of lung cancer, thoracic surgical evaluation was requested for a pericardial window.  I examined the patient in the ICU after reviewing her echo and CT scans and discussed the procedure of subxiphoid pericardial window.   She understood that it would be done under anesthesia.  A small incision beneath the breast bone would be made, and the fluid and tissue would be sent for analysis.  She knew that she would have a pericardial drain for 24-48 hours  postoperatively.  She  understood the risks of the surgery to include bleeding, recurrent pericardial effusion, postoperative respiratory problems, and infection.  She agreed to proceed with surgery.  DESCRIPTION OF PROCEDURE:  The patient was brought from preop holding where informed consent was documented and all final questions addressed.  She was placed supine on the operating table, and general anesthesia was induced.  She remained stable.  A  transesophageal echo probe was placed by the anesthesia team.  This documented the presence of a moderate to large pericardial effusion.  The patient's chest and upper abdomen were prepped and draped as a sterile field.  A proper time-out was performed.   A small incision centered on the xiphoid was made.  The xiphoid was excised.  The sternal elevating retractor was placed.  The soft tissue anterior to the pericardium was dissected away to expose the anterior pericardium.  A small incision was made, and  bloody fluid under pressure exited.  A total of 300 mL of bloody pericardial fluid was removed.  Using a scalpel, an oval incision was made in the anterior pericardium approximately 2.5 to 3 cm in diameter, and the tissue was removed.  The heart was  inspected.  The epicardium had edema, but no discrete tumor nodules were noted.  There was no active bleeding noted.  The pericardial space was irrigated with sterile saline.  All the fluid was removed.  A soft 28-French Bard drain was placed dependently  in the pericardial space and brought out through a separate incision.  The retractor was then removed.  The incision was then closed in layers using Vicryl in a standard fashion.  The chest  tube was connected to a Pleur-Evac drainage system, and a  sterile dressing was placed.  The patient was extubated, returned to the recovery room for further observation and care.  Anticoagulation will need to be held for 24 hours due to the bloody nature of the pericardial  fluid.  LN/NUANCE  D:08/08/2018 T:08/08/2018 JOB:[id number]

## 2018-08-08 NOTE — Anesthesia Procedure Notes (Signed)
Procedure Name: Intubation Date/Time: 08/08/2018 7:33 AM Performed by: Imagene Riches, CRNA Pre-anesthesia Checklist: Patient identified, Emergency Drugs available, Suction available and Patient being monitored Patient Re-evaluated:Patient Re-evaluated prior to induction Oxygen Delivery Method: Circle System Utilized Preoxygenation: Pre-oxygenation with 100% oxygen Induction Type: IV induction Ventilation: Mask ventilation without difficulty Laryngoscope Size: Miller and 3 Grade View: Grade I Tube type: Oral Tube size: 7.5 mm Number of attempts: 1 Airway Equipment and Method: Stylet and Oral airway Placement Confirmation: ETT inserted through vocal cords under direct vision,  positive ETCO2 and breath sounds checked- equal and bilateral Secured at: 24 cm Tube secured with: Tape Dental Injury: Teeth and Oropharynx as per pre-operative assessment

## 2018-08-08 NOTE — Anesthesia Procedure Notes (Signed)
Central Venous Catheter Insertion Performed by: Murvin Natal, MD, anesthesiologist Start/End3/26/2020 7:45 AM, 08/08/2018 8:00 AM Patient location: OR. Preanesthetic checklist: patient identified, IV checked, site marked, risks and benefits discussed, surgical consent, monitors and equipment checked, pre-op evaluation, timeout performed and anesthesia consent Position: Trendelenburg Hand hygiene performed , maximum sterile barriers used  and Seldinger technique used Catheter size: 8 Fr Total catheter length 16. Central line was placed.Double lumen Procedure performed using ultrasound guided technique. Ultrasound Notes:anatomy identified, needle tip was noted to be adjacent to the nerve/plexus identified, no ultrasound evidence of intravascular and/or intraneural injection and image(s) printed for medical record Attempts: 1 Following insertion, dressing applied, line sutured and Biopatch. Post procedure assessment: blood return through all ports, free fluid flow and no air  Patient tolerated the procedure well with no immediate complications.

## 2018-08-08 NOTE — Progress Notes (Signed)
eLink Physician-Brief Progress Note Patient Name: Leah Howell DOB: 1946/03/15 MRN: [id number]   Date of Service  08/08/2018  HPI/Events of Note  Pt requesting reinstatement of DNR order which was suspended for pericardial window. Pt is alert & oriented x 4 and completely lucid during the conversation which was witnessed by [facility] RN  eICU Interventions  DNR order reinstated per patient wishes        Frederik Pear 08/08/2018, 11:10 PM

## 2018-08-08 NOTE — Progress Notes (Signed)
TCTS BRIEF SICU PROGRESS NOTE  Day of Surgery  S/P Procedure(s) (LRB): SUBXYPHOID PERICARDIAL WINDOW (N/A)   Awake and alert, very mild pain from surgical procedure NSR w/ stable BP Breathing comfortably w/ O2 sats 98% on 3 L/min Minimal chest tube output  Plan: Continue routine early postop  Rexene Alberts, MD 08/08/2018 3:49 PM

## 2018-08-08 NOTE — Transfer of Care (Signed)
Immediate Anesthesia Transfer of Care Note  Patient: Leah Howell  Procedure(s) Performed: SUBXYPHOID PERICARDIAL WINDOW (N/A )  Patient Location: PACU  Anesthesia Type:General  Level of Consciousness: drowsy  Airway & Oxygen Therapy: Patient Spontanous Breathing and Patient connected to face mask oxygen  Post-op Assessment: Report given to RN and Post -op Vital signs reviewed and stable  Post vital signs: Reviewed and stable  Last Vitals:  Vitals Value Taken Time  BP 136/64 08/08/2018  9:26 AM  Temp 36.4 C 08/08/2018  9:26 AM  Pulse 112 08/08/2018  9:28 AM  Resp 25 08/08/2018  9:28 AM  SpO2 93 % 08/08/2018  9:28 AM  Vitals shown include unvalidated device data.  Last Pain:  Vitals:   08/08/18 0509  TempSrc:   PainSc: Asleep         Complications: No apparent anesthesia complications

## 2018-08-08 NOTE — Anesthesia Postprocedure Evaluation (Signed)
Anesthesia Post Note  Patient: Leah Howell  Procedure(s) Performed: SUBXYPHOID PERICARDIAL WINDOW (N/A )     Patient location during evaluation: PACU Anesthesia Type: General Level of consciousness: awake Pain management: pain level controlled Vital Signs Assessment: post-procedure vital signs reviewed and stable Respiratory status: spontaneous breathing, nonlabored ventilation, respiratory function stable and patient connected to nasal cannula oxygen Cardiovascular status: blood pressure returned to baseline and stable Postop Assessment: no apparent nausea or vomiting Anesthetic complications: no    Last Vitals:  Vitals:   08/08/18 1130 08/08/18 1230  BP: 132/61 (!) 118/53  Pulse: (!) 107 (!) 103  Resp: (!) 23 (!) 34  Temp:    SpO2: 98% 97%    Last Pain:  Vitals:   08/08/18 1100  TempSrc: Oral  PainSc: 8                  [name]

## 2018-08-08 NOTE — Progress Notes (Signed)
  Echocardiogram Echocardiogram Transesophageal has been performed.  Leah Howell 08/08/2018, 9:04 AM

## 2018-08-08 NOTE — Progress Notes (Signed)
Pre Procedure note for inpatients:   Leah Howell has been scheduled for Procedure(s): SUBXYPHOID PERICARDIAL WINDOW (N/A) today. The various methods of treatment have been discussed with the patient. After consideration of the risks, benefits and treatment options the patient has consented to the planned procedure.   The patient has been seen and labs reviewed. There are no changes in the patient's condition to prevent proceeding with the planned procedure today.  Recent labs:  Lab Results  Component Value Date   WBC 6.9 08/08/2018   HGB 12.7 08/08/2018   HCT 39.0 08/08/2018   PLT 159 08/08/2018   GLUCOSE 130 (H) 08/07/2018   CHOL 222 (H) 02/15/2017   TRIG 105.0 02/15/2017   HDL 87.90 02/15/2017   LDLCALC 113 (H) 02/15/2017   ALT 26 08/07/2018   AST 27 08/07/2018   NA 130 (L) 08/07/2018   K 4.8 08/07/2018   CL 97 (L) 08/07/2018   CREATININE 0.73 08/07/2018   BUN 18 08/07/2018   CO2 21 (L) 08/07/2018   INR 1.0 08/08/2018    Len Childs, MD 08/08/2018 7:18 AM

## 2018-08-09 ENCOUNTER — Ambulatory Visit: Payer: Medicare Other

## 2018-08-09 ENCOUNTER — Inpatient Hospital Stay (HOSPITAL_COMMUNITY): Payer: Medicare Other

## 2018-08-09 LAB — TYPE AND SCREEN
ABO/RH(D): A POS
Antibody Screen: NEGATIVE
Unit division: 0
Unit division: 0

## 2018-08-09 LAB — CBC
HCT: 38.3 % (ref 36.0–46.0)
Hemoglobin: 12.5 g/dL (ref 12.0–15.0)
MCH: 31.9 pg (ref 26.0–34.0)
MCHC: 32.6 g/dL (ref 30.0–36.0)
MCV: 97.7 fL (ref 80.0–100.0)
Platelets: 121 10*3/uL — ABNORMAL LOW (ref 150–400)
RBC: 3.92 MIL/uL (ref 3.87–5.11)
RDW: 12.5 % (ref 11.5–15.5)
WBC: 5.3 10*3/uL (ref 4.0–10.5)
nRBC: 0 % (ref 0.0–0.2)

## 2018-08-09 LAB — BASIC METABOLIC PANEL
ANION GAP: 9 (ref 5–15)
BUN: 11 mg/dL (ref 8–23)
CO2: 23 mmol/L (ref 22–32)
Calcium: 8.6 mg/dL — ABNORMAL LOW (ref 8.9–10.3)
Chloride: 100 mmol/L (ref 98–111)
Creatinine, Ser: 0.55 mg/dL (ref 0.44–1.00)
GFR calc Af Amer: >60 mL/min (ref >60)
GFR calc non Af Amer: >60 mL/min (ref >60)
GLUCOSE: 113 mg/dL — AB (ref 70–99)
Potassium: 4.1 mmol/L (ref 3.5–5.1)
Sodium: 132 mmol/L — ABNORMAL LOW (ref 135–145)

## 2018-08-09 LAB — BPAM RBC
Blood Product Expiration Date: 202004132359
Blood Product Expiration Date: 202004132359
Unit Type and Rh: 6200
Unit Type and Rh: 6200

## 2018-08-09 LAB — ACID FAST SMEAR (AFB, MYCOBACTERIA): Acid Fast Smear: NEGATIVE

## 2018-08-09 LAB — POCT I-STAT 7, (LYTES, BLD GAS, ICA,H+H)
Acid-Base Excess: 3 mmol/L — ABNORMAL HIGH (ref 0.0–2.0)
Bicarbonate: 27.3 mmol/L (ref 20.0–28.0)
Calcium, Ion: 1.26 mmol/L (ref 1.15–1.40)
HEMATOCRIT: 37 % (ref 36.0–46.0)
HEMOGLOBIN: 12.6 g/dL (ref 12.0–15.0)
O2 Saturation: 93 %
PCO2 ART: 41.1 mmHg (ref 32.0–48.0)
PO2 ART: 65 mmHg — AB (ref 83.0–108.0)
Patient temperature: 98.4
Potassium: 4.4 mmol/L (ref 3.5–5.1)
Sodium: 131 mmol/L — ABNORMAL LOW (ref 135–145)
TCO2: 29 mmol/L (ref 22–32)
pH, Arterial: 7.429 (ref 7.350–7.450)

## 2018-08-09 LAB — HEPARIN LEVEL (UNFRACTIONATED): Heparin Unfractionated: 0.21 IU/mL — ABNORMAL LOW (ref 0.30–0.70)

## 2018-08-09 LAB — PHOSPHORUS: Phosphorus: 2.7 mg/dL (ref 2.5–4.6)

## 2018-08-09 LAB — MAGNESIUM: Magnesium: 1.7 mg/dL (ref 1.7–2.4)

## 2018-08-09 MED ORDER — GUAIFENESIN ER 600 MG PO TB12
600.0000 mg | ORAL_TABLET | Freq: Two times a day (BID) | ORAL | Status: DC
  Administered 2018-08-09 – 2018-08-11 (×4): 600 mg via ORAL
  Filled 2018-08-09 (×4): qty 1

## 2018-08-09 MED ORDER — LEVALBUTEROL HCL 0.63 MG/3ML IN NEBU: 0.6300 mg | INHALATION_SOLUTION | RESPIRATORY_TRACT | Status: DC | PRN

## 2018-08-09 MED ORDER — METOPROLOL TARTRATE 12.5 MG HALF TABLET
12.5000 mg | ORAL_TABLET | Freq: Two times a day (BID) | ORAL | Status: DC
  Administered 2018-08-09 – 2018-08-12 (×7): 12.5 mg via ORAL
  Filled 2018-08-09 (×7): qty 1

## 2018-08-09 MED ORDER — ACETAMINOPHEN 325 MG PO TABS
650.0000 mg | ORAL_TABLET | Freq: Four times a day (QID) | ORAL | Status: DC | PRN
  Administered 2018-08-11: 650 mg via ORAL
  Filled 2018-08-09: qty 2

## 2018-08-09 MED ORDER — ENSURE ENLIVE PO LIQD
237.0000 mL | Freq: Three times a day (TID) | ORAL | Status: DC
  Administered 2018-08-09 – 2018-08-11 (×2): 237 mL via ORAL

## 2018-08-09 NOTE — Evaluation (Signed)
Physical Therapy Evaluation Patient Details Name: Leah Howell MRN: [id number] DOB: 12-Feb-1946 Today's Date: 08/09/2018   History of Present Illness  73 year old female with history of metastatic lung CA transferred from [facility] to [facility] for a pericardial window due to pericardial tamponade.    Clinical Impression  Pt admitted with above diagnosis. Pt currently with functional limitations due to the deficits listed below (see PT Problem List). PTA, pt living at home with husband and daughter PRN, independent with mobility. Today, patient ambulating 54' with contact guard, HR 115-140 with mobility, 105 resting. Will cont to follow and progress as tolerated.  Pt will benefit from skilled PT to increase their independence and safety with mobility to allow discharge to the venue listed below.       Follow Up Recommendations Home health PT;Supervision/Assistance - 24 hour    Equipment Recommendations  (TBD)    Recommendations for Other Services       Precautions / Restrictions Precautions Precautions: Fall Precaution Comments: chest tube Restrictions Weight Bearing Restrictions: No      Mobility  Bed Mobility Overal bed mobility: Modified Independent                Transfers Overall transfer level: Needs assistance Equipment used: Rolling walker (2 wheeled);4-wheeled walker Transfers: Sit to/from Stand Sit to Stand: Mod assist            Ambulation/Gait Ambulation/Gait assistance: Min guard Gait Distance (Feet): 75 Feet Assistive device: Rolling walker (2 wheeled) Gait Pattern/deviations: Step-to pattern Gait velocity: decreasd   General Gait Details: patient walking with RW on RA, HR115-140.  no overt balance issues, desat to 89  Stairs            Wheelchair Mobility    Modified Rankin (Stroke Patients Only)       Balance                                             Pertinent Vitals/Pain Pain Assessment: No/denies pain     Home Living Family/patient expects to be discharged to:: Private residence Living Arrangements: Spouse/significant other Available Help at Discharge: Family Type of Home: House Home Access: Level entry     Home Layout: One level Home Equipment: None      Prior Function Level of Independence: Independent               Hand Dominance   Dominant Hand: Right    Extremity/Trunk Assessment   Upper Extremity Assessment Upper Extremity Assessment: Generalized weakness    Lower Extremity Assessment Lower Extremity Assessment: Generalized weakness    Cervical / Trunk Assessment Cervical / Trunk Assessment: Normal  Communication   Communication: No difficulties  Cognition Arousal/Alertness: Awake/alert   Overall Cognitive Status: Within Functional Limits for tasks assessed                                        General Comments      Exercises     Assessment/Plan    PT Assessment Patient needs continued PT services  PT Problem List Decreased strength       PT Treatment Interventions DME instruction;Gait training;Functional mobility training;Therapeutic activities;Therapeutic exercise;Balance training    PT Goals (Current goals can be found in the Care Plan section)  Acute  Rehab PT Goals Patient Stated Goal: go home when ready PT Goal Formulation: With patient Time For Goal Achievement: 08/23/18 Potential to Achieve Goals: Good    Frequency Min 3X/week   Barriers to discharge        Co-evaluation               AM-PAC PT "6 Clicks" Mobility  Outcome Measure Help needed turning from your back to your side while in a flat bed without using bedrails?: None Help needed moving from lying on your back to sitting on the side of a flat bed without using bedrails?: A Little Help needed moving to and from a bed to a chair (including a wheelchair)?: A Little Help needed standing up from a chair using your arms (e.g., wheelchair or  bedside chair)?: A Little Help needed to walk in hospital room?: A Little Help needed climbing 3-5 steps with a railing? : A Lot 6 Click Score: 18    End of Session Equipment Utilized During Treatment: Gait belt Activity Tolerance: Patient tolerated treatment well Patient left: in bed;with call bell/phone within reach Nurse Communication: Mobility status PT Visit Diagnosis: Unsteadiness on feet (R26.81)    Time: 9357-0177 PT Time Calculation (min) (ACUTE ONLY): 24 min   Charges:   PT Evaluation $PT Eval Low Complexity: 1 Low PT Treatments $Gait Training: 8-22 mins      Reinaldo Berber, PT, DPT Acute Rehabilitation Services Pager: [phone] Office: [place] 08/09/2018, 4:12 PM

## 2018-08-09 NOTE — TOC Initial Note (Signed)
Transition of Care [facility]) - Initial/Assessment Note    Patient Details  Name: Leah Howell MRN: [id number] Date of Birth: 1945/08/14  [facility]) CM/SW Contact:    Maryclare Labrador, RN Phone Number: 08/09/2018, 2:04 PM  Update:  PT recommended.  CM spoke with daughter via phone. Daughter plans to go on line to  Medicaid .gov HH list for moms zipcode - CM will follow up with daughter regarding choice.  Daughter request Palliative consult to discuss [place] with mom due to recent events as pt has verbalized to daughter that she "was done" - CM communicated with bedside nurse  Clinical Narrative:   PTA independent from home alone.  CM spoke with daughter [name] regarding discharge planning as pt deferred plans to daughter.  Daughter would like to wait until PT/OT assesses pt to determine what help may be needed in the home as far as home health goes.                 Expected Discharge Plan: [place]     Patient Goals and CMS Choice        Expected Discharge Plan and Services Expected Discharge Plan: [place]       Living arrangements for the past 2 months: Single Family Home Expected Discharge Date: (unknown)                        Prior Living Arrangements/Services Living arrangements for the past 2 months: Single Family Home Lives with:: Self          Need for Family Participation in Patient Care: Yes (Comment) Care giver support system in place?: Yes (comment)      Activities of Daily Living Home Assistive Devices/Equipment: Eyeglasses, Dentures (specify type), Nebulizer(upper/lower dentures) ADL Screening (condition at time of admission) Patient's cognitive ability adequate to safely complete daily activities?: Yes Is the patient deaf or have difficulty hearing?: No Does the patient have difficulty seeing, even when wearing glasses/contacts?: No Does the patient have difficulty concentrating, remembering, or  making decisions?: No Patient able to express need for assistance with ADLs?: Yes Does the patient have difficulty dressing or bathing?: No Independently performs ADLs?: Yes (appropriate for developmental age) Does the patient have difficulty walking or climbing stairs?: Yes(secondary to shortness of breath) Weakness of Legs: None Weakness of Arms/Hands: None  Permission Sought/Granted                  Emotional Assessment           Psych Involvement: No (comment)  Admission diagnosis:  Pericardial effusion [I31.3] Pleural effusion [J90] Malignant neoplasm of upper lobe of left lung (HCC) [C34.12] Acute deep vein thrombosis (DVT) of other vein of right upper extremity (HCC) [I82.621] Acute on chronic respiratory failure with hypoxia ([place]) [J96.21] Patient Active Problem List   Diagnosis Date Noted  . Acute on chronic respiratory failure with hypoxia ([place]) 08/07/2018  . Pericardial effusion with cardiac tamponade 08/07/2018  . Acute deep vein thrombosis (DVT) of right upper extremity ([place]) 08/07/2018  . Non-small cell carcinoma of left lung, stage 4 ([place]) 07/23/2018  . Goals of care, counseling/discussion 07/23/2018  . Encounter for antineoplastic chemotherapy 07/23/2018  . Encounter for antineoplastic immunotherapy 07/23/2018  . Colon cancer screening 02/06/2013  . COPD (chronic obstructive pulmonary disease) ([place]) 08/07/2012  . Cervical cancer screening 08/07/2012  . Personal history of lung cancer 08/07/2012  . Cancer of upper  lobe of right lung ([employer]) 01/25/2012  . CHRONIC OBSTRUCTIVE PULMONARY DISEASE, MODERATE 01/07/2008  . CHRONIC&OTH PULMONARY MANIFESTS DUE RADIATION 11/05/2007   PCP:  Tammi Sou, MD Pharmacy:   [facility] [address] [place] - [place] [address] [place] [address] [place] [address] Phone: [phone] Fax: [phone]     Social Determinants of Health (SDOH) Interventions    Readmission Risk Interventions No  flowsheet data found.

## 2018-08-09 NOTE — Progress Notes (Signed)
[place] TEAM 1 - Stepdown/ICU TEAM  Leah Howell  XHB:[id number] DOB: [birth year] DOA: 08/07/2018 PCP: Tammi Sou, MD    Brief Narrative:  73 year old female with history of metastatic lung CA transferred from [facility] to [facility] for a pericardial window due to pericardial tamponade.   Significant Events: 3/25 admit [facility] - transfer to [facility]  3/26 pericardial window  Subjective: C/o expected discomfort in chest, as well as arms due to signif edema. Denies current SOB. Poor appetite, but no n/v, or abdom pain.   Assessment & Plan:  Pericardial tamponade S/P pericardial window - post-op care per TCTS - chest tube remains in place today  Non-small cell Carcinoma Lung - 2008 (R lung)  & 2020 (L lung) Previous R lung - now large LUL mass - under the care of Dr. 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 diagnosed in February 2020  Right IJ, subclavian, axillary vein DVT Heparin gtt per Pharmacy - plan to transition to lovenox at time of d/c home   Sinus Tachycardia  Due to above - no PE on CTangio - low dose BB as tolerated  COPD Well compensated at this time w/o wheezing   CKD Stage 2 crt is stable   DVT prophylaxis: heparin gtt  Code Status: DNR - NO CODE Family Communication: no family present at time of exam  Disposition Plan: D/C home on lovenox as soon as cleared from surgical standpoint - PT/OT evals   Consultants:  Cardiology TCTS PCCM  Antimicrobials:  none  Objective: Blood pressure 135/62, pulse (!) 111, temperature 97.9 F (36.6 C), temperature source Oral, resp. rate (!) 30, height 5\' 6"  (1.676 m), weight 54.7 kg, SpO2 95 %.  Intake/Output Summary (Last 24 hours) at 08/09/2018 1038 Last data filed at 08/09/2018 0841  Gross per 24 hour  Intake 585 ml  Output 1176 ml  Net -591 ml   Filed Weights   08/07/18 1107 08/08/18 0500 08/09/18 0500  Weight: 52.8 kg 55.3 kg 54.7 kg    Examination: General: No acute respiratory distress Lungs: Clear to auscultation bilaterally - poor air movement B bases  Cardiovascular: tachycardic - regular - no M  Abdomen: Nontender, nondistended, soft, bowel sounds positive, no rebound, no ascites, no appreciable mass Extremities: 2+ R UE edema, 1+ L UE edema   CBC: Recent Labs  Lab 08/05/18 1138  08/07/18 1147 08/07/18 2253 08/08/18 0506 08/09/18 0415 08/09/18 0435  WBC 5.2   < > 10.3 7.8 6.9 5.3  --   NEUTROABS 4.2  --  9.5*  --   --   --   --   HGB 14.0   < > 13.4 13.3 12.7 12.5 12.6  HCT 42.4  --  40.4 39.5 39.0 38.3 37.0  MCV 100.5*  --  100.5* 99.2 99.0 97.7  --   PLT 194   < > 193 139* 159 121*  --    < > = values in this interval not displayed.   Basic Metabolic Panel: Recent Labs  Lab 08/07/18 1147 08/07/18 2253 08/09/18 0415 08/09/18 0435  NA 130* 130* 132* 131*  K 4.5 4.8 4.1 4.4  CL 95* 97* 100  --   CO2 24 21* 23  --  GLUCOSE 130* 130* 113*  --   BUN 24* 18 11  --   CREATININE 0.80 0.73 0.55  --   CALCIUM 8.8* 8.7* 8.6*  --   MG  --   --  1.7  --   PHOS  --   --  2.7  --    GFR: Estimated Creatinine Clearance: 55.7 mL/min (by C-G formula based on SCr of 0.55 mg/dL).  Liver Function Tests: Recent Labs  Lab 08/05/18 1138 08/07/18 1147 08/07/18 2253  AST 21 33 27  ALT 20 31 26   ALKPHOS 91 68 58  BILITOT 0.8 0.8 0.9  PROT 6.9 6.1* 5.6*  ALBUMIN 3.8 3.5 3.0*    Coagulation Profile: Recent Labs  Lab 08/08/18 0506  INR 1.0    Cardiac Enzymes: Recent Labs  Lab 08/07/18 1150  TROPONINI <0.03     Recent Results (from the past 240 hour(s))  MRSA PCR Screening     Status: None   Collection Time: 08/07/18  7:49 PM  Result Value Ref Range Status   MRSA by PCR NEGATIVE NEGATIVE Final    Comment:        The GeneXpert  MRSA Assay (FDA approved for NASAL specimens only), is one component of a comprehensive MRSA colonization surveillance program. It is not intended to diagnose MRSA infection nor to guide or monitor treatment for MRSA infections. Performed at [facility], [place] [address], [place], [place] [address]   Aerobic/Anaerobic Culture (surgical/deep wound)     Status: None (Preliminary result)   Collection Time: 08/08/18  8:52 AM  Result Value Ref Range Status   Specimen Description PERICARDIAL  Final   Special Requests SPECIMEN ON SWABS  Final   Gram Stain   Final    MODERATE WBC PRESENT, PREDOMINANTLY PMN NO ORGANISMS SEEN Performed at [facility], [place] [address] [place], [place] [address]    Culture PENDING  Incomplete   Report Status PENDING  Incomplete  Aerobic/Anaerobic Culture (surgical/deep wound)     Status: None (Preliminary result)   Collection Time: 08/08/18  8:58 AM  Result Value Ref Range Status   Specimen Description TISSUE PERICARDIAL  Final   Special Requests NONE  Final   Gram Stain   Final    NO WBC SEEN NO ORGANISMS SEEN Performed at [facility], [place] [address] [place], [place] [address]    Culture PENDING  Incomplete   Report Status PENDING  Incomplete     Scheduled Meds: . acetaminophen  1,000 mg Oral Q6H   Or  . acetaminophen (TYLENOL) oral liquid 160 mg/5 mL  1,000 mg Oral Q6H  . bisacodyl  10 mg Oral Daily  . gabapentin  100 mg Oral TID  . mouth rinse  15 mL Mouth Rinse BID  . metoprolol tartrate  12.5 mg Oral BID  . mometasone-formoterol  2 puff Inhalation BID  . nystatin  5 mL Oral QID  . senna-docusate  1 tablet Oral QHS   Continuous Infusions: . heparin 900 Units/hr (08/09/18 0931)  . potassium chloride       LOS: 2 days   Cherene Altes, MD [facility] Office  [phone] Pager - Text Page per Amion  If 7PM-7AM, please contact night-coverage per Amion 08/09/2018, 10:38 AM

## 2018-08-09 NOTE — Progress Notes (Signed)
ANTICOAGULATION CONSULT NOTE - Follow up [facility] for heparin Indication: DVT RUE  No Known Allergies  Patient Measurements: Height: 5\' 6"  (167.6 cm) Weight: 120 lb 9.5 oz (54.7 kg) IBW/kg (Calculated) : 59.3 Heparin Dosing Weight: 53kg  Vital Signs: Temp: 97.8 F (36.6 C) (03/27 2000) Temp Source: Oral (03/27 2000) BP: 121/79 (03/27 2100) Pulse Rate: 115 (03/27 2100)  Labs: Recent Labs    08/07/18 1147 08/07/18 1150 08/07/18 2253 08/08/18 0506 08/09/18 0415 08/09/18 0435 08/09/18 1808  HGB 13.4  --  13.3 12.7 12.5 12.6  --   HCT 40.4  --  39.5 39.0 38.3 37.0  --   PLT 193  --  139* 159 121*  --   --   APTT  --   --   --  74*  --   --   --   LABPROT  --   --   --  13.3  --   --   --   INR  --   --   --  1.0  --   --   --   HEPARINUNFRC  --   --  0.23*  --   --   --  0.21*  CREATININE 0.80  --  0.73  --  0.55  --   --   TROPONINI  --  <0.03  --   --   --   --   --     Estimated Creatinine Clearance: 55.7 mL/min (by C-G formula based on SCr of 0.55 mg/dL).   Medical History: Past Medical History:  Diagnosis Date  . Cancer of upper lobe of left lung ([place]) 06/2018   Bx+ carcinoma, PET + mediastinal mets, R lung mets, pelvic region bone mets. MRI brain pending as of 3/3/.  Pt to see Dr. Earlie Server approx 3/10 to discuss plan.  . Chronic renal insufficiency, stage 2 (mild)    GFR 60s  . COPD (chronic obstructive pulmonary disease) ([place])   . Hyperlipidemia    Mild--TLC  . Personal history of lung cancer 2008   2008; upper lobe R lung (Dr. Earlie Server).  Got chemo and rad.  Has been on observation and monitoring with annual CT's of chest since 2009.  2017 was "released" by onc.  06/2018->LUL mass;-->lung ca, +mets on PET scan.     Assessment: 88 yoF admitted with RUE found to have extensive DVT in setting of lung cancer. CT negative for PE, pt was started on IV heparin. Pt noted to have early tamponade and is now s/p subxiphoid pericardial window.  Pharmacy asked to resume heparin with no bolus  Heparin drip 900 uts/hr HL 0.21 at goal.  Minimal drainage from  Pericardial tube per MD   Goal of Therapy:  Heparin level 0.2-0.3 units/ml Monitor platelets by anticoagulation protocol: Yes   Plan:  -Continue heparin 900 units/hr Daily HL, CBC  Bonnita Nasuti Pharm.D. CPP, BCPS Clinical Pharmacist [phone] 08/09/2018 9:27 PM

## 2018-08-09 NOTE — Progress Notes (Addendum)
1 Day Post-Op Procedure(s) (LRB): SUBXYPHOID PERICARDIAL WINDOW (N/A) Subjective: Sitting up in the bedside chair, said pain control is adequate.   Objective: Vital signs in last 24 hours: Temp:  [97.6 F (36.4 C)-98.4 F (36.9 C)] 98.4 F (36.9 C) (03/27 0300) Pulse Rate:  [97-116] 116 (03/27 0700) Cardiac Rhythm: Sinus tachycardia (03/27 0000) Resp:  [7-34] 17 (03/27 0700) BP: (96-138)/(48-105) 123/74 (03/27 0700) SpO2:  [92 %-99 %] 94 % (03/27 0700) Arterial Line BP: (117-184)/(45-97) 160/63 (03/27 0700) Weight:  [54.7 kg] 54.7 kg (03/27 0500)    Intake/Output from previous day: 03/26 0701 - 03/27 0700 In: 1635 [P.O.:484; I.V.:800.9; IV Piggyback:350.1] Out: 1884 [Urine:1190; Blood:250; Chest Tube:90] Intake/Output this shift: No intake/output data recorded.  General appearance: alert and mild distress Heart: sinus tachycardia Lungs: coarse rhonchi bilaterally. Wound: Subxyphoid incision covered with a dry dressing. MT drained 29ml overnight and 50ml since procedure.   Lab Results: Recent Labs    08/08/18 0506 08/09/18 0415 08/09/18 0435  WBC 6.9 5.3  --   HGB 12.7 12.5 12.6  HCT 39.0 38.3 37.0  PLT 159 121*  --    BMET:  Recent Labs    08/07/18 2253 08/09/18 0415 08/09/18 0435  NA 130* 132* 131*  K 4.8 4.1 4.4  CL 97* 100  --   CO2 21* 23  --   GLUCOSE 130* 113*  --   BUN 18 11  --   CREATININE 0.73 0.55  --   CALCIUM 8.7* 8.6*  --     PT/INR:  Recent Labs    08/08/18 0506  LABPROT 13.3  INR 1.0   ABG    Component Value Date/Time   PHART 7.429 08/09/2018 0435   HCO3 27.3 08/09/2018 0435   TCO2 29 08/09/2018 0435   O2SAT 93.0 08/09/2018 0435   CBG (last 3)  No results for input(s): GLUCAP in the last 72 hours.  Assessment/Plan: S/P Procedure(s) (LRB): SUBXYPHOID PERICARDIAL WINDOW (N/A)  -POD1 subxyphoid pericardial window with draiinage of 285ml effusion in 73yo female with metastatic lung cancer. Minimal drainage from the  mediastinal tube, leave in place for now and plan to remove in AM.  Will remove a-line, central line, and Foley catheter. Keep in ICU until mediastinal tube is out.  -Right subclavian, axillary vein DVT. OK to resume heparin therapy from CTS standpoint with initial goal antiXA of 0.2-0.3. Pharmacy consult for heparin management has been placed.   -Sinus tachycardia-add low dose b-blocker.  -  LOS: 2 days    Antony Odea, PA-C [phone] 08/09/2018   Leave patient in ICU while pericardial drain in place- prob pull tomorrow with minimal postop drainage Path on pericardial tissue and fluid still pending  patient examined and medical record reviewed,agree with above note. Tharon Aquas Trigt III 08/09/2018

## 2018-08-09 NOTE — Progress Notes (Signed)
Initial Nutrition Assessment  RD working remotely.  DOCUMENTATION CODES:   Not applicable  INTERVENTION:   Add Ensure Enlive TID (each provides 350 kcal, 20 g protein)  NUTRITION DIAGNOSIS:   Increased nutrient needs related to cancer and cancer related treatments as evidenced by estimated needs.  GOAL:   Patient will meet greater than or equal to 90% of their needs   MONITOR:   PO intake, Supplement acceptance, Weight trends, Labs, I & O's  REASON FOR ASSESSMENT:   Consult Assessment of nutrition requirement/status, Poor PO  ASSESSMENT:   73 yo female, admitted with acute on chronic respiratory failure. PMH significant for lung CA with metastatic disease, HLD, COPD, CKD stage 2. Currently on chemotherapy and radiation therapy.  Labs: sodium 131 (L), glucose 113 mg/dL Meds: Dulcolax EC, Lopressor, senna-docusate, heparin, ultram PRN  3/26: subxyphoid pericardial window surgery  RD unable to reach pt via phone. Per consult, pt has been eating poorly x 1 month. Per chart review, pt wt is stable. Given cancer and treatment plan, will order Ensure Enlive TID to support increased nutrition needs. Will continue to monitor adequacy of intake.  NUTRITION - FOCUSED PHYSICAL EXAM: Deferred - RD working remotely  Diet Order:  No known food allergies 3/25: Regular, NPO Diet Order            Diet Heart Room service appropriate? Yes; Fluid consistency: Thin  Diet effective now            PO Intake 100% x 1 meal recorded 3/26  EDUCATION NEEDS:  No education needs have been identified at this time  Skin:  Skin Assessment: Skin Integrity Issues: Skin Integrity Issues:: Incisions, Other (Comment) Incisions: chest Other: Ecchymosis - R arm, L left; Skin tear - R arm  Last BM:  3/25  Height:  Ht Readings from Last 1 Encounters:  08/07/18 5\' 6"  (1.676 m)   Weight:  Wt Readings from Last 30 Encounters:  08/09/18 54.7 kg  08/05/18 52.8 kg  07/29/18 52.2 kg  07/23/18  51.9 kg  07/12/18 54 kg  07/04/18 53.5 kg  07/03/18 53.5 kg  06/26/18 53.4 kg  06/26/18 53.4 kg  06/18/18 53.1 kg  05/21/18 52.7 kg  05/13/18 53.1 kg  03/14/18 52.7 kg  02/28/18 53.1 kg  Wt stable x 6 months  Ideal Body Weight:  59.1 kg  BMI:  Body mass index is 19.46 kg/m. considered underweight for age group  Estimated Nutritional Needs: calculated based on 3/27 wt (~55 kg)   Kcal:  1650-1925 (30-35 kcal/kg)  Protein:  66-83 gm (1.2-1.5 g/kg)  Fluid:  1 mL/kcal or per MD   Althea Grimmer, MS, RDN, LDN Pager: [phone] Available Mondays and Fridays, 9am-2pm

## 2018-08-09 NOTE — Progress Notes (Signed)
Patient ID: Leah Howell, female   DOB: 02-24-1946, 73 y.o.   MRN: [id number] TCTS Evening Rounds:  Hemodynamically stable. Minimal drainage from pericardial tube.

## 2018-08-10 ENCOUNTER — Inpatient Hospital Stay (HOSPITAL_COMMUNITY): Payer: Medicare Other

## 2018-08-10 DIAGNOSIS — Z515 Encounter for palliative care: Secondary | ICD-10-CM

## 2018-08-10 DIAGNOSIS — Z7189 Other specified counseling: Secondary | ICD-10-CM

## 2018-08-10 DIAGNOSIS — R0602 Shortness of breath: Secondary | ICD-10-CM

## 2018-08-10 LAB — COMPREHENSIVE METABOLIC PANEL
ALBUMIN: 2.6 g/dL — AB (ref 3.5–5.0)
ALT: 17 U/L (ref 0–44)
AST: 22 U/L (ref 15–41)
Alkaline Phosphatase: 54 U/L (ref 38–126)
Anion gap: 10 (ref 5–15)
BUN: 15 mg/dL (ref 8–23)
CALCIUM: 8.4 mg/dL — AB (ref 8.9–10.3)
CO2: 25 mmol/L (ref 22–32)
Chloride: 95 mmol/L — ABNORMAL LOW (ref 98–111)
Creatinine, Ser: 0.62 mg/dL (ref 0.44–1.00)
GFR calc Af Amer: >60 mL/min (ref >60)
GFR calc non Af Amer: >60 mL/min (ref >60)
Glucose, Bld: 101 mg/dL — ABNORMAL HIGH (ref 70–99)
Potassium: 4.1 mmol/L (ref 3.5–5.1)
Sodium: 130 mmol/L — ABNORMAL LOW (ref 135–145)
Total Bilirubin: 0.5 mg/dL (ref 0.3–1.2)
Total Protein: 5.1 g/dL — ABNORMAL LOW (ref 6.5–8.1)

## 2018-08-10 LAB — CBC
HEMATOCRIT: 37.4 % (ref 36.0–46.0)
Hemoglobin: 12 g/dL (ref 12.0–15.0)
MCH: 31.7 pg (ref 26.0–34.0)
MCHC: 32.1 g/dL (ref 30.0–36.0)
MCV: 98.9 fL (ref 80.0–100.0)
Platelets: 110 10*3/uL — ABNORMAL LOW (ref 150–400)
RBC: 3.78 MIL/uL — ABNORMAL LOW (ref 3.87–5.11)
RDW: 12.6 % (ref 11.5–15.5)
WBC: 1.6 10*3/uL — AB (ref 4.0–10.5)
nRBC: 0 % (ref 0.0–0.2)

## 2018-08-10 LAB — HEPARIN LEVEL (UNFRACTIONATED): Heparin Unfractionated: 0.23 [IU]/mL — ABNORMAL LOW (ref 0.30–0.70)

## 2018-08-10 MED ORDER — LEVALBUTEROL HCL 0.63 MG/3ML IN NEBU
0.6300 mg | INHALATION_SOLUTION | Freq: Three times a day (TID) | RESPIRATORY_TRACT | Status: DC
  Administered 2018-08-10 (×3): 0.63 mg via RESPIRATORY_TRACT
  Filled 2018-08-10 (×3): qty 3

## 2018-08-10 MED ORDER — FENTANYL CITRATE (PF) 100 MCG/2ML IJ SOLN: 50.0000 ug | INTRAMUSCULAR | Status: DC | PRN

## 2018-08-10 MED ORDER — ALPRAZOLAM 0.5 MG PO TABS
0.5000 mg | ORAL_TABLET | Freq: Every evening | ORAL | Status: DC | PRN
  Administered 2018-08-10 – 2018-08-11 (×2): 0.5 mg via ORAL
  Filled 2018-08-10 (×2): qty 1

## 2018-08-10 MED ORDER — MORPHINE SULFATE (CONCENTRATE) 10 MG/0.5ML PO SOLN: 5.0000 mg | ORAL | Status: DC | PRN

## 2018-08-10 MED ORDER — MORPHINE SULFATE (CONCENTRATE) 10 MG/0.5ML PO SOLN
5.0000 mg | ORAL | Status: DC
  Administered 2018-08-10 – 2018-08-11 (×6): 5 mg via ORAL
  Filled 2018-08-10 (×6): qty 0.5

## 2018-08-10 MED ORDER — ALPRAZOLAM 0.5 MG PO TABS
0.5000 mg | ORAL_TABLET | Freq: Three times a day (TID) | ORAL | Status: DC | PRN
  Administered 2018-08-10 – 2018-08-11 (×3): 0.5 mg via ORAL
  Filled 2018-08-10 (×3): qty 1

## 2018-08-10 MED ORDER — ALPRAZOLAM 0.5 MG PO TABS: 0.5000 mg | ORAL_TABLET | Freq: Two times a day (BID) | ORAL | Status: DC | PRN

## 2018-08-10 NOTE — Progress Notes (Signed)
Patient ID: Leah Howell, female   DOB: 1945/11/03, 73 y.o.   MRN: [id number] TCTS Evening Rounds:  Pericardial tube removed today. She can be transferred or sent home with hospice at discretion of medical team. She will need the chest tube suture out in a week or two.

## 2018-08-10 NOTE — Progress Notes (Signed)
[place] TEAM 1 - Stepdown/ICU TEAM  Leah Howell  FIE:[id number] DOB: October 18, 1945 DOA: 08/07/2018 PCP: Tammi Sou, MD    Brief Narrative:  73 year old female with history of metastatic lung CA transferred from [facility] to [facility] for a pericardial window due to pericardial tamponade.   Significant Events: 3/25 admit [facility] - transfer to [facility]  3/26 pericardial window  Subjective: The patient reports that she is short of breath.  She had significant chest pain last night.  She is wheezing a bit today and suffering with expectoration of sputum, something she reports is a chronic problem for her.  She also reports significant anxiety concerning her situation.  We have spoken in detail concerning her goals of care.  She has been treated with chemotherapy at least x1 course and was supposed to be receiving daily radiation therapy this week.  She has completed at least 2 radiation therapy treatments thus far.  At this time she tells me that she is not interested in any further chemotherapy or radiation therapy.  She wants to place a priority on being around her family and being at home.  We have discussed the complexities of this regarding the current coronavirus concern and agree that placing a premium on keeping her out of the hospital is most consistent with her goal of spending as much time with family as possible.  She has agreed to meet with palliative care today and appears ready to embrace home hospice assistance.  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 area diagnosed in February 2020 -see discussion above -Palliative Care to meet with patient today  Right IJ, subclavian, axillary vein DVT Heparin gtt per Pharmacy - plan to transition to lovenox at time of d/c home  Sinus Tachycardia  Due to above - no PE on CTangio - low dose BB as tolerated  COPD Mild diffuse wheezing likely more related to her lung cancer and increased mucus production -continue Humibid -nebs PRN  CKD Stage 2 crt is stable   DVT prophylaxis: heparin gtt  Code Status: DNR - NO CODE Family Communication: no family present at time of exam  Disposition Plan: Hopeful for discharge home with hospice care once symptoms stabilized and when cleared by Thoracic Surgery  Consultants:  Cardiology TCTS PCCM  Antimicrobials:  none  Objective: Blood pressure 111/75, pulse (!) 103, temperature 97.9 F (36.6 C), temperature source Oral, resp. rate 20, height 5\' 6"  (1.676 m), weight 54.7 kg, SpO2 99 %.  Intake/Output Summary (Last 24 hours) at 08/10/2018 0955 Last data filed at 08/10/2018 0700 Gross per 24 hour  Intake 553.09 ml  Output 505 ml  Net 48.09 ml   Filed Weights   08/07/18 1107 08/08/18 0500 08/09/18 0500  Weight: 52.8 kg 55.3 kg 54.7 kg    Examination: General: Mild respiratory distress but no extremitas Lungs: Coarse upper airway sounds throughout with mild expiratory wheezing Cardiovascular: tachycardic - regular - no M  Abdomen: NT/ND, soft, bowel sounds positive, no rebound Extremities: 2+ R UE edema, 1+ L UE edema without significant change  CBC: Recent Labs  Lab 08/05/18 1138 08/07/18 1147  08/08/18 0506 08/09/18 0415 08/09/18 0435 08/10/18 0618  WBC 5.2 10.3   < > 6.9 5.3  --  1.6*  NEUTROABS 4.2 9.5*  --   --   --   --   --   HGB 14.0 13.4   < > 12.7 12.5 12.6 12.0  HCT 42.4 40.4   < > 39.0 38.3 37.0 37.4  MCV 100.5* 100.5*   < > 99.0 97.7  --  98.9  PLT 194 193   < >  159 121*  --  110*   < > = values in this interval not displayed.   Basic Metabolic Panel: Recent Labs  Lab 08/07/18 2253 08/09/18 0415 08/09/18 0435 08/10/18 0618  NA 130* 132* 131* 130*  K 4.8 4.1 4.4 4.1  CL 97* 100  --  95*  CO2 21* 23  --  25  GLUCOSE 130* 113*  --  101*  BUN 18 11  --  15  CREATININE 0.73 0.55  --  0.62  CALCIUM 8.7* 8.6*  --  8.4*  MG  --  1.7  --   --   PHOS  --  2.7  --   --    GFR: Estimated Creatinine Clearance: 55.7 mL/min (by C-G formula based on SCr of 0.62 mg/dL).  Liver Function Tests: Recent Labs  Lab 08/05/18 1138 08/07/18 1147 08/07/18 2253 08/10/18 0618  AST 21 33 27 22  ALT 20 31 26 17   ALKPHOS 91 68 58 54  BILITOT 0.8 0.8 0.9 0.5  PROT 6.9 6.1* 5.6* 5.1*  ALBUMIN 3.8 3.5 3.0* 2.6*    Coagulation Profile: Recent Labs  Lab 08/08/18 0506  INR 1.0    Cardiac Enzymes: Recent Labs  Lab 08/07/18 1150  TROPONINI <0.03     Recent Results (from the past 240 hour(s))  MRSA PCR Screening     Status: None   Collection Time: 08/07/18  7:49 PM  Result Value Ref Range Status   MRSA by PCR NEGATIVE NEGATIVE Final    Comment:        The GeneXpert MRSA Assay (FDA approved for NASAL specimens only), is one component of a comprehensive MRSA colonization surveillance program. It is not intended to diagnose MRSA infection nor to guide or monitor treatment for MRSA infections. Performed at [facility], [place] [address] [place], [place] [address]   Aerobic/Anaerobic Culture (surgical/deep wound)     Status: None (Preliminary result)   Collection Time: 08/08/18  8:52 AM  Result Value Ref Range Status   Specimen Description PERICARDIAL  Final   Special Requests SPECIMEN ON SWABS  Final   Gram Stain   Final    MODERATE WBC PRESENT, PREDOMINANTLY PMN NO ORGANISMS SEEN    Culture   Final    NO GROWTH 1 DAY Performed at [facility], [place] [address] [place],  [address]    Report Status PENDING  Incomplete    Aerobic/Anaerobic Culture (surgical/deep wound)     Status: None (Preliminary result)   Collection Time: 08/08/18  8:58 AM  Result Value Ref Range Status   Specimen Description TISSUE PERICARDIAL  Final   Special Requests NONE  Final   Gram Stain NO WBC SEEN NO ORGANISMS SEEN   Final   Culture   Final  NO GROWTH 1 DAY Performed at [facility], [place] [address] [place], [place] [address]    Report Status PENDING  Incomplete  Acid Fast Smear (AFB)     Status: None   Collection Time: 08/08/18  8:58 AM  Result Value Ref Range Status   AFB Specimen Processing Comment  Final    Comment: Tissue Grinding and Digestion/Decontamination   Acid Fast Smear Negative  Final    Comment: (NOTE) Performed At: [facility] [place], [place] [id number] Rush Farmer MD KC:[id number]    Source (AFB) TISSUE  Final    Comment: PERICARDIAL Performed at [facility], [place] [address], [place], [place] [address]      Scheduled Meds:  bisacodyl  10 mg Oral Daily   feeding supplement (ENSURE ENLIVE)  237 mL Oral TID BM   gabapentin  100 mg Oral TID   guaiFENesin  600 mg Oral BID   mouth rinse  15 mL Mouth Rinse BID   metoprolol tartrate  12.5 mg Oral BID   mometasone-formoterol  2 puff Inhalation BID   nystatin  5 mL Oral QID   senna-docusate  1 tablet Oral QHS   Continuous Infusions:  heparin 900 Units/hr (08/10/18 0919)   potassium chloride       LOS: 3 days   Cherene Altes, MD [facility] Office  [phone] Pager - Text Page per Shea Evans  If 7PM-7AM, please contact night-coverage per Amion 08/10/2018, 9:55 AM

## 2018-08-10 NOTE — Consult Note (Addendum)
Consultation Note Date: 08/10/2018   Patient Name: Leah Howell  DOB: 1945/05/22  MRN: [id number]  Age / Sex: 73 y.o., female  PCP: Tammi Sou, MD Referring Physician: Cherene Altes, MD  Reason for Consultation: Establishing goals of care, Hospice Evaluation, Non pain symptom management, Pain control and Psychosocial/spiritual support  HPI/Patient Profile: 73 y.o. female  with past medical history of hyperlipidemia, COPD, chronic kidney disease stage II, lung cancer (initial diagnosis 2008; return of disease in February 2020) admitted on 08/07/2018 with worsening shortness of breath and arm swelling.  Patient was initially diagnosed with lung cancer in 2008, right upper lobe mass and underwent a lobectomy.  She had been doing well and then later had a recurrence of disease.  She now has progression of disease, pericardial effusion, tamponade, right upper extremity DVT.  She underwent pericardial window on 08/08/2018.  She has been undergoing palliative chemo.  She is followed by Dr. Earlie Server, oncology.  Consult ordered for goals of care in the setting of advanced lung cancer  Clinical Assessment and Goals of Care: Patient seen, chart reviewed.  Patient is alert and oriented but obviously short of breath at rest.  She shares with me how long she has been living with lung cancer and at this point she is grown quite tired of medical interventions and is wishing to pursue comfort based approach to care.  Introduced palliative medicine services as an additional resource and source of support with specific emphasis on patient goals and aimed at improving quality of life, symptom management, for patient.  Also compared and contrasted hospice versus palliative care.  At this point we did get her daughter, [name], on the phone via face time to discuss goals of care.  Patient is married, but per patient,  husband is struggling, and [name] is her main support in helping her with healthcare decisions.  Mrs. Hennings states she no longer wishes to pursue chemo or radiation and she is desirous to go home with hospice so she can be with her family.  Her daughter supports this decision.  Patient is also agreeable to starting scheduled opioids to manage her dyspnea and chest wall pain.  Risks and benefits of opioids were discussed as well as side effects.  Patient is capable of making her own healthcare decisions at this point.  She is a DNR.  Her healthcare proxy in the event she were unable to do so is her daughter Orvan Falconer at [phone]    SUMMARY OF RECOMMENDATIONS   DNR/DNI Home with hospice when medically maximized Consult placed to care management for hospice referral and [place] Planning:  DNR    Symptom Management:   Dyspnea: We will start scheduled morphine concentrate at 5 mg every 4 hours and 5 to 10 mg every 2 hours as needed for shortness of breath.  Morphine concentrate is a drug that she can use in the home with hospice.  Continue usage of fentanyl IV for more immediate relief.  Patient states that  that worked well for her breathing last night  Palliative Prophylaxis:   Aspiration, Bowel Regimen, Delirium Protocol, Eye Care, Frequent Pain Assessment, Oral Care and Turn Reposition  Additional Recommendations (Limitations, Scope, Preferences):  Avoid Hospitalization, Minimize Medications, Initiate Comfort Feeding, No Artificial Feeding, No Chemotherapy, No Hemodialysis, No Radiation and No Tracheostomy  Psycho-social/Spiritual:   Desire for further Chaplaincy support:no  Additional Recommendations: Referral to Community Resources   Prognosis:   < 6 months in the setting of advanced lung cancer now with metastatic disease to bone lymphadenopathy as well as right upper extremity DVT, status post pericardial window for pericardial  effusion, albumin 2.6  Discharge Planning: Home with Hospice      Primary Diagnoses: Present on Admission: . Acute on chronic respiratory failure with hypoxia ([place]) . Pericardial effusion with cardiac tamponade . Non-small cell carcinoma of left lung, stage 4 ([place]) . Acute deep vein thrombosis (DVT) of right upper extremity ([place])   I have reviewed the medical record, interviewed the patient and family, and examined the patient. The following aspects are pertinent.  Past Medical History:  Diagnosis Date  . Cancer of upper lobe of left lung ([place]) 06/2018   Bx+ carcinoma, PET + mediastinal mets, R lung mets, pelvic region bone mets. MRI brain pending as of 3/3/.  Pt to see Dr. Earlie Server approx 3/10 to discuss plan.  . Chronic renal insufficiency, stage 2 (mild)    GFR 60s  . COPD (chronic obstructive pulmonary disease) ([place])   . Hyperlipidemia    Mild--TLC  . Personal history of lung cancer 2008   2008; upper lobe R lung (Dr. Earlie Server).  Got chemo and rad.  Has been on observation and monitoring with annual CT's of chest since 2009.  2017 was "released" by onc.  06/2018->LUL mass;-->lung ca, +mets on PET scan.   Social History   Socioeconomic History  . Marital status: Married    Spouse name: Not on file  . Number of children: Not on file  . Years of education: Not on file  . Highest education level: Not on file  Occupational History  . Not on file  Social Needs  . Financial resource strain: Not on file  . Food insecurity:    Worry: Not on file    Inability: Not on file  . Transportation needs:    Medical: Not on file    Non-medical: Not on file  Tobacco Use  . Smoking status: Former Smoker    Types: Cigarettes    Last attempt to quit: 05/15/2002    Years since quitting: 16.2  . Smokeless tobacco: Never Used  Substance and Sexual Activity  . Alcohol use: Yes    Comment: occasional  . Drug use: No  . Sexual activity: Yes    Birth control/protection: Post-menopausal   Lifestyle  . Physical activity:    Days per week: Not on file    Minutes per session: Not on file  . Stress: Not on file  Relationships  . Social connections:    Talks on phone: Not on file    Gets together: Not on file    Attends religious service: Not on file    Active member of club or organization: Not on file    Attends meetings of clubs or organizations: Not on file    Relationship status: Not on file  Other Topics Concern  . Not on file  Social History Narrative   Married, one daughter.   HS education.  Homemaker.   Former  smoker; quit 2004.   No alcohol or drugs.     Exercise: walking    Family History  Problem Relation Age of Onset  . Alcohol abuse Mother   . Heart disease Mother   . Alcohol abuse Father   . Liver disease Brother    Scheduled Meds: . bisacodyl  10 mg Oral Daily  . feeding supplement (ENSURE ENLIVE)  237 mL Oral TID BM  . gabapentin  100 mg Oral TID  . guaiFENesin  600 mg Oral BID  . levalbuterol  0.63 mg Nebulization Q8H  . mouth rinse  15 mL Mouth Rinse BID  . metoprolol tartrate  12.5 mg Oral BID  . mometasone-formoterol  2 puff Inhalation BID  . nystatin  5 mL Oral QID  . senna-docusate  1 tablet Oral QHS   Continuous Infusions: . heparin 900 Units/hr (08/10/18 0919)  . potassium chloride     PRN Meds:.acetaminophen, ALPRAZolam, ALPRAZolam, fentaNYL (SUBLIMAZE) injection, ipratropium, levalbuterol, ondansetron (ZOFRAN) IV, oxyCODONE, potassium chloride, traMADol Medications Prior to Admission:  Prior to Admission medications   Medication Sig Start Date End Date Taking? Authorizing Provider  albuterol (ACCUNEB) 1.25 MG/3ML nebulizer solution Take 3 mLs (1.25 mg total) by nebulization every 6 (six) hours as needed. 02/28/18  Yes McGowen, [name], MD  albuterol (VENTOLIN HFA) 108 (90 Base) MCG/ACT inhaler Inhale 1-2 puffs into the lungs every 4 (four) hours as needed for wheezing or shortness of breath. 05/13/18  Yes McGowen, [name], MD   budesonide-formoterol [facility]) 160-4.5 MCG/ACT inhaler Inhale 2 puffs into the lungs 2 (two) times daily. 02/28/18  Yes McGowen, [name], MD  clonazePAM (KLONOPIN) 0.5 MG tablet 1-2 tabs po qhs prn insomnia and worry Patient taking differently: Take 0.5-1 mg by mouth at bedtime as needed for anxiety. 1-2 tabs po qhs prn insomnia and worry 03/01/18  Yes McGowen, [name], MD  gabapentin (NEURONTIN) 100 MG capsule Take 1 capsule (100 mg total) by mouth 3 (three) times daily. 07/29/18  Yes Tanner, Lyndon Code., PA-C  guaiFENesin (MUCINEX) 600 MG 12 hr tablet Take 600 mg by mouth 2 (two) times daily as needed for cough or to loosen phlegm.   Yes [provider]  ibuprofen (ADVIL,MOTRIN) 200 MG tablet Take 600 mg by mouth every 8 (eight) hours as needed.   Yes [provider]  ipratropium (ATROVENT) 0.03 % nasal spray Place 2 sprays into both nostrils every 12 (twelve) hours. Patient taking differently: Place 2 sprays into both nostrils 2 (two) times daily as needed for rhinitis.  10/11/17  Yes McGowen, [name], MD  prochlorperazine (COMPAZINE) 5 MG tablet 1 to 2 tablets PO QID prn nausea. Patient taking differently: Take 5-10 mg by mouth 4 (four) times daily as needed for nausea. 1 to 2 tablets PO QID prn nausea. 07/31/18  Yes Tanner, Lyndon Code., PA-C  traMADol (ULTRAM) 50 MG tablet Take 1 tablet (50 mg total) by mouth every 6 (six) hours as needed for moderate pain. 07/29/18  Yes Tanner, Lyndon Code., PA-C  methylPREDNISolone (MEDROL DOSEPAK) 4 MG TBPK tablet 6 x 1 day, 5 x 1 day, 4 x 1 day, 3 x 1 day, 2 x 1 day, 1 x 1 day 07/29/18   Harle Stanford., PA-C   No Known Allergies Review of Systems  Unable to perform ROS: Other    Physical Exam Vitals signs and nursing note reviewed.  Constitutional:      Appearance: She is ill-appearing.  HENT:  Head: Normocephalic and atraumatic.     Nose: Nose normal.  Neck:     Musculoskeletal: Normal range of motion.  Cardiovascular:     Rate and  Rhythm: Tachycardia present.  Pulmonary:     Comments: Increased work of breathing at rest Chest:     Chest wall: Tenderness present.  Abdominal:     General: Abdomen is flat.  Skin:    General: Skin is warm and dry.  Neurological:     General: No focal deficit present.     Mental Status: She is alert and oriented to person, place, and time.  Psychiatric:     Comments: Anxious     Vital Signs: BP 111/75   Pulse (!) 103   Temp 97.9 F (36.6 C) (Oral)   Resp 20   Ht 5\' 6"  (1.676 m)   Wt 54.7 kg   SpO2 99%   BMI 19.46 kg/m  Pain Scale: 0-10 POSS *See Group Information*: S-Acceptable,Sleep, easy to arouse Pain Score: 3    SpO2: SpO2: 99 % O2 Device:SpO2: 99 % O2 Flow Rate: .O2 Flow Rate (L/min): 3 L/min  IO: Intake/output summary:   Intake/Output Summary (Last 24 hours) at 08/10/2018 1114 Last data filed at 08/10/2018 0900 Gross per 24 hour  Intake 557.76 ml  Output 480 ml  Net 77.76 ml    LBM: Last BM Date: 08/07/18 Baseline Weight: Weight: 52.8 kg Most recent weight: Weight: 54.7 kg     Palliative Assessment/Data:   Flowsheet Rows     Most Recent Value  Intake Tab  Referral Department  Hospitalist  Unit at Time of Referral  Cardiac/Telemetry Unit  Palliative Care Primary Diagnosis  Other (Comment)  Date Notified  08/09/18  Palliative Care Type  New Palliative care  Reason for referral  Non-pain Symptom, Pain, Clarify Goals of Care, End of Life Care Assistance, Psychosocial or Spiritual support, Counsel Regarding Hospice  Date of Admission  08/07/18  Date first seen by Palliative Care  08/10/18  # of days Palliative referral response time  1 Day(s)  # of days IP prior to Palliative referral  2  Clinical Assessment  Palliative Performance Scale Score  40%  Pain Max last 24 hours  Not able to report  Pain Min Last 24 hours  Not able to report  Dyspnea Max Last 24 Hours  Not able to report  Dyspnea Min Last 24 hours  Not able to report  Nausea Max Last  24 Hours  Not able to report  Nausea Min Last 24 Hours  Not able to report  Anxiety Max Last 24 Hours  Not able to report  Anxiety Min Last 24 Hours  Not able to report  Other Max Last 24 Hours  Not able to report  Psychosocial & Spiritual Assessment  Palliative Care Outcomes  Patient/Family meeting held?  Yes  Who was at the meeting?  dtr, pt  Palliative Care Outcomes  Improved pain interventions, Improved non-pain symptom therapy, Counseled regarding hospice, Provided psychosocial or spiritual support, Clarified goals of care  Patient/Family wishes: Interventions discontinued/not started   Mechanical Ventilation, BiPAP, Trach, PEG, Hemodialysis  Palliative Care follow-up planned  Yes, Facility      Time In: 1000 Time Out: 1110 Time Total: 70 min Greater than 50%  of this time was spent counseling and coordinating care related to the above assessment and plan.Staffed with Dr. Thereasa Solo  Addendum 1255: Updated husband, Andreas Newport via phone, as to patient's decision to forego further palliative chemo  or radiation.  Also discussed with Mr. Carmon plan for home with hospice as support to offer increased services in the home.  Mr. Straka is sad that his wife's health is changing but he supports her decision and is anxious to have her back home as well  Signed by: Dory Horn, NP   Please contact Palliative Medicine Team phone at [phone] for questions and concerns.  For individual provider: See Shea Evans

## 2018-08-10 NOTE — Progress Notes (Signed)
PT Cancellation Note  Patient Details Name: Leah Howell MRN: [id number] DOB: 15-Sep-1945   Cancelled Treatment:    Reason Eval/Treat Not Completed: Other (comment). Pt politely declining therapy session stating she "doesn't have the breath." Is wanting PT to check back in tomorrow.  Ellamae Sia, PT, DPT Acute Rehabilitation Services Pager [phone] Office [phone]    Willy Eddy 08/10/2018, 2:58 PM

## 2018-08-10 NOTE — Progress Notes (Signed)
ANTICOAGULATION CONSULT NOTE - Follow up [place] for heparin Indication: DVT RUE  No Known Allergies  Patient Measurements: Height: 5\' 6"  (167.6 cm) Weight: 120 lb 9.5 oz (54.7 kg) IBW/kg (Calculated) : 59.3 Heparin Dosing Weight: 53kg  Vital Signs: Temp: 97.9 F (36.6 C) (03/28 0750) Temp Source: Oral (03/28 0750) BP: 124/70 (03/28 0600) Pulse Rate: 108 (03/28 0700)  Labs: Recent Labs    08/07/18 1150 08/07/18 2253 08/08/18 0506 08/09/18 0415 08/09/18 0435 08/09/18 1808 08/10/18 0618  HGB  --  13.3 12.7 12.5 12.6  --  12.0  HCT  --  39.5 39.0 38.3 37.0  --  37.4  PLT  --  139* 159 121*  --   --  110*  APTT  --   --  74*  --   --   --   --   LABPROT  --   --  13.3  --   --   --   --   INR  --   --  1.0  --   --   --   --   HEPARINUNFRC  --  0.23*  --   --   --  0.21* 0.23*  CREATININE  --  0.73  --  0.55  --   --  0.62  TROPONINI <0.03  --   --   --   --   --   --     Estimated Creatinine Clearance: 55.7 mL/min (by C-G formula based on SCr of 0.62 mg/dL).   Medical History: Past Medical History:  Diagnosis Date  . Cancer of upper lobe of left lung ([place]) 06/2018   Bx+ carcinoma, PET + mediastinal mets, R lung mets, pelvic region bone mets. MRI brain pending as of 3/3/.  Pt to see Dr. Earlie Server approx 3/10 to discuss plan.  . Chronic renal insufficiency, stage 2 (mild)    GFR 60s  . COPD (chronic obstructive pulmonary disease) ([place])   . Hyperlipidemia    Mild--TLC  . Personal history of lung cancer 2008   2008; upper lobe R lung (Dr. Earlie Server).  Got chemo and rad.  Has been on observation and monitoring with annual CT's of chest since 2009.  2017 was "released" by onc.  06/2018->LUL mass;-->lung ca, +mets on PET scan.     Assessment: 9 yoF admitted with RUE found to have extensive DVT in setting of lung cancer. CT negative for PE, pt was started on IV heparin. Pt noted to have early tamponade and is now s/p subxiphoid pericardial window.    Heparin drip 900 uts/hr HL 0.23 at low end of desired goal.  Minimal drainage from pericardial tube so plans are to remove it today.   Goal of Therapy:  Heparin level 0.2-0.3 units/ml Monitor platelets by anticoagulation protocol: Yes   Plan:  Continue heparin 900 units/hr Daily HL, CBC  Erin Hearing PharmD., BCPS Clinical Pharmacist 08/10/2018 8:24 AM

## 2018-08-10 NOTE — Progress Notes (Signed)
2 Days Post-Op Procedure(s) (LRB): SUBXYPHOID PERICARDIAL WINDOW (N/A) Subjective: Had a bad night. Short of breath and chest wall pain.  Objective: Vital signs in last 24 hours: Temp:  [97.4 F (36.3 C)-98.4 F (36.9 C)] 97.9 F (36.6 C) (03/28 0750) Pulse Rate:  [92-121] 108 (03/28 0700) Cardiac Rhythm: Sinus tachycardia (03/27 2100) Resp:  [16-30] 24 (03/28 0700) BP: (98-151)/(49-86) 124/70 (03/28 0600) SpO2:  [85 %-100 %] 98 % (03/28 0700) Arterial Line BP: (137-144)/(59-67) 137/67 (03/27 1000)  Hemodynamic parameters for last 24 hours: CVP:  [33 mmHg] 33 mmHg  Intake/Output from previous day: 03/27 0701 - 03/28 0700 In: 553.1 [P.O.:360; I.V.:193.1] Out: 625 [Urine:585; Chest Tube:40] Intake/Output this shift: No intake/output data recorded.  General appearance: alert and cooperative Heart: regular rate and rhythm, S1, S2 normal, no murmur, click, rub or gallop Lungs: rhonchi bilaterally  Incision ok  Lab Results: Recent Labs    08/09/18 0415 08/09/18 0435 08/10/18 0618  WBC 5.3  --  1.6*  HGB 12.5 12.6 12.0  HCT 38.3 37.0 37.4  PLT 121*  --  110*   BMET:  Recent Labs    08/09/18 0415 08/09/18 0435 08/10/18 0618  NA 132* 131* 130*  K 4.1 4.4 4.1  CL 100  --  95*  CO2 23  --  25  GLUCOSE 113*  --  101*  BUN 11  --  15  CREATININE 0.55  --  0.62  CALCIUM 8.6*  --  8.4*    PT/INR:  Recent Labs    08/08/18 0506  LABPROT 13.3  INR 1.0   ABG    Component Value Date/Time   PHART 7.429 08/09/2018 0435   HCO3 27.3 08/09/2018 0435   TCO2 29 08/09/2018 0435   O2SAT 93.0 08/09/2018 0435   CBG (last 3)  No results for input(s): GLUCAP in the last 72 hours.  CLINICAL DATA:  Status post pericardial window creation  EXAM: PORTABLE CHEST 1 VIEW  COMPARISON:  08/09/2018  FINDINGS: Stable left upper and lower lobe masses. Stable right apical capping. Stable bilateral pleural effusions, right greater than left. No pneumothorax.  The heart  is normal in size.  Stable mediastinal drain.  Left IJ venous catheter terminates in the SVC.  IMPRESSION: Stable mediastinal drain status post pericardial window creation.  Stable left lung masses.  No interval change.   Electronically Signed   By: Julian Hy M.D.   On: 08/10/2018 05:46   Assessment/Plan: S/P Procedure(s) (LRB): SUBXYPHOID PERICARDIAL WINDOW (N/A)  Minimal drainage from tube so will remove it.  Can transition to oral anticoagulant at your discretion.    LOS: 3 days    Gaye Pollack 08/10/2018

## 2018-08-11 DIAGNOSIS — R0789 Other chest pain: Secondary | ICD-10-CM

## 2018-08-11 DIAGNOSIS — K5903 Drug induced constipation: Secondary | ICD-10-CM

## 2018-08-11 DIAGNOSIS — B370 Candidal stomatitis: Secondary | ICD-10-CM

## 2018-08-11 LAB — CBC
HCT: 36.3 % (ref 36.0–46.0)
Hemoglobin: 11.9 g/dL — ABNORMAL LOW (ref 12.0–15.0)
MCH: 32.2 pg (ref 26.0–34.0)
MCHC: 32.8 g/dL (ref 30.0–36.0)
MCV: 98.4 fL (ref 80.0–100.0)
NRBC: 0 % (ref 0.0–0.2)
Platelets: 95 10*3/uL — ABNORMAL LOW (ref 150–400)
RBC: 3.69 MIL/uL — ABNORMAL LOW (ref 3.87–5.11)
RDW: 12.6 % (ref 11.5–15.5)
WBC: 1.2 10*3/uL — CL (ref 4.0–10.5)

## 2018-08-11 LAB — HEPARIN LEVEL (UNFRACTIONATED)
Heparin Unfractionated: 0.26 IU/mL — ABNORMAL LOW (ref 0.30–0.70)
Heparin Unfractionated: 0.25 IU/mL — ABNORMAL LOW (ref 0.30–0.70)

## 2018-08-11 MED ORDER — ENOXAPARIN SODIUM 60 MG/0.6ML ~~LOC~~ SOLN
1.0000 mg/kg | Freq: Two times a day (BID) | SUBCUTANEOUS | Status: DC
  Administered 2018-08-11 – 2018-08-12 (×3): 55 mg via SUBCUTANEOUS
  Filled 2018-08-11 (×3): qty 0.55

## 2018-08-11 MED ORDER — SENNOSIDES-DOCUSATE SODIUM 8.6-50 MG PO TABS: 2.0000 | ORAL_TABLET | Freq: Every day | ORAL | Status: DC

## 2018-08-11 MED ORDER — MORPHINE SULFATE (CONCENTRATE) 10 MG/0.5ML PO SOLN
10.0000 mg | ORAL | Status: DC | PRN
  Administered 2018-08-11: 10 mg via ORAL
  Filled 2018-08-11: qty 0.5

## 2018-08-11 MED ORDER — GUAIFENESIN ER 600 MG PO TB12
1200.0000 mg | ORAL_TABLET | Freq: Two times a day (BID) | ORAL | Status: DC
  Administered 2018-08-11 – 2018-08-12 (×2): 1200 mg via ORAL
  Filled 2018-08-11 (×3): qty 2

## 2018-08-11 MED ORDER — MORPHINE SULFATE 15 MG PO TABS
15.0000 mg | ORAL_TABLET | ORAL | Status: DC
  Administered 2018-08-11 – 2018-08-12 (×7): 15 mg via ORAL
  Filled 2018-08-11 (×7): qty 1

## 2018-08-11 MED ORDER — LEVALBUTEROL HCL 0.63 MG/3ML IN NEBU
0.6300 mg | INHALATION_SOLUTION | Freq: Three times a day (TID) | RESPIRATORY_TRACT | Status: DC
  Administered 2018-08-11 – 2018-08-12 (×4): 0.63 mg via RESPIRATORY_TRACT
  Filled 2018-08-11 (×4): qty 3

## 2018-08-11 MED ORDER — POLYETHYLENE GLYCOL 3350 17 G PO PACK
17.0000 g | PACK | Freq: Every day | ORAL | Status: DC
  Administered 2018-08-11: 17 g via ORAL
  Filled 2018-08-11 (×2): qty 1

## 2018-08-11 MED ORDER — GABAPENTIN 100 MG PO CAPS
200.0000 mg | ORAL_CAPSULE | Freq: Three times a day (TID) | ORAL | Status: DC
  Administered 2018-08-11 – 2018-08-12 (×3): 200 mg via ORAL
  Filled 2018-08-11 (×3): qty 2

## 2018-08-11 MED ORDER — MORPHINE SULFATE (CONCENTRATE) 10 MG/0.5ML PO SOLN: 10.0000 mg | ORAL | Status: DC

## 2018-08-11 MED ORDER — FLUCONAZOLE 100 MG PO TABS
100.0000 mg | ORAL_TABLET | Freq: Every day | ORAL | Status: DC
  Administered 2018-08-11 – 2018-08-12 (×2): 100 mg via ORAL
  Filled 2018-08-11 (×2): qty 1

## 2018-08-11 NOTE — TOC Progression Note (Addendum)
Transition of Care [facility]) - Progression Note    Patient Details  Name: Leah Howell MRN: [id number] Date of Birth: 1945-07-06  Transition of Care [facility]) CM/SW Contact  Leah Leach, RN Phone Number: 08/11/2018, 1:06 PM  Clinical Narrative:    Contacted by Leah Howell from [place] to arrange home hospice services.  Pt wants to go home ASAP with [facility].  Pt is making decisions together with daughter, Leah Howell. Pt's husband is also involved. Patient is on O2 PRN here and will need O2 for home and transport.  Pt will also need hospital bed, 3n1, shower chair, rollator, and wheelchair.  Pt will also need Lovenox at home for pain/swelling control in arm. Family will not be able to be educated to give Lovenox injections due the no visitor restrictions in place at this time.    Among the patient and family,there is concern for patient being neutropenic in the hospital but also changing pain medication today and would like for her to stay to establish regimen. They are willing for patient to stay until tomorrow.    Referral called to Leah Howell with [facility].  Leah Howell states we cannot get DME and may have difficulty getting pain medications today.  Leah Howell will call to discuss with family.  We will plan for d/c tomorrow after DME delivered to the home.  Pt will need ambulance transport.  Leah Howell will communicate that hospice nurse will need to teach family about Lovenox injections at admission visit.    Leah Howell to complete DNR forms.   Information faxed to [facility] at [phone].  Expected Discharge Plan: [place] Barriers to Discharge: No Barriers Identified  Expected Discharge Plan and Services Expected Discharge Plan: [place] In-house Referral: Hospice / Palliative Care Discharge Planning Services: CM Consult Post Acute Care Choice: Hospice Living arrangements for the past 2 months: Single Family Home Expected  Discharge Date: (unknown)               DME Arranged: 3-N-1, Hospital bed, Oxygen, Walker rolling with seat, Wheelchair manual DME Agency: [place]  Readmission Risk Interventions Readmission Risk Prevention Plan 08/11/2018  Transportation Screening Complete  PCP or Specialist Appt within 5-7 Days Complete  Home Care Screening Complete  Medication Review (RN CM) Complete  Some recent data might be hidden

## 2018-08-11 NOTE — Progress Notes (Addendum)
Physical Therapy Treatment Patient Details Name: Leah Howell MRN: [id number] DOB: 09-05-1945 Today's Date: 08/11/2018    History of Present Illness 73 year old female with history of metastatic lung CA [facility] a pericardial window due to pericardial tamponade.     PT Comments    Pt agreeable to work with therapy and states, "I want to do my regular things, like washing when I get home." Pt ambulating in room with walker and min guard assist. Desaturation to 82% on RA, rebounded to 95% on 2L O2. Would recommend walker with mobility for home for increased stability and independence. Educated pt on incentive spirometer use with good carryover by pt and discussed activity progression and energy conservation techniques including home set up (OT provided conservation handout).     Follow Up Recommendations  Home health PT;Supervision/Assistance - 24 hour     Equipment Recommendations  Rolling walker with 5" wheels;3in1 (PT);Wheelchair (measurements PT);Other (comment)(tub bench)    Recommendations for Other Services       Precautions / Restrictions Precautions Precautions: Fall Precaution Comments: chest tube Restrictions Weight Bearing Restrictions: No    Mobility  Bed Mobility Overal bed mobility: Modified Independent             General bed mobility comments: HOB elevated  Transfers Overall transfer level: Needs assistance Equipment used: Rolling walker (2 wheeled);4-wheeled walker Transfers: Sit to/from Stand Sit to Stand: Min guard         General transfer comment: pulling up from RW despite cueing  Ambulation/Gait Ambulation/Gait assistance: Min guard Gait Distance (Feet): 30 Feet Assistive device: Rolling walker (2 wheeled) Gait Pattern/deviations: Step-through pattern;Decreased stride length;Shuffle Gait velocity: decreased   General Gait Details: pt with mild unsteadiness, requiring min guard assist for stability. cues for  walker etiquette/management   Stairs             Wheelchair Mobility    Modified Rankin (Stroke Patients Only)       Balance Overall balance assessment: Needs assistance Sitting-balance support: Feet supported Sitting balance-Leahy Scale: Good     Standing balance support: Bilateral upper extremity supported Standing balance-Leahy Scale: Poor Standing balance comment: reliant on external support                            Cognition Arousal/Alertness: Awake/alert Behavior During Therapy: WFL for tasks assessed/performed Overall Cognitive Status: Within Functional Limits for tasks assessed                                        Exercises      General Comments        Pertinent Vitals/Pain Pain Assessment: Faces Faces Pain Scale: No hurt    Home Living                      Prior Function            PT Goals (current goals can now be found in the care plan section) Acute Rehab PT Goals Patient Stated Goal: go home when ready PT Goal Formulation: With patient Time For Goal Achievement: 08/23/18 Potential to Achieve Goals: Good Progress towards PT goals: Progressing toward goals    Frequency    Min 3X/week      PT Plan Equipment recommendations need to be updated    Co-evaluation PT/OT/SLP  Co-Evaluation/Treatment: Yes Reason for Co-Treatment: To address functional/ADL transfers PT goals addressed during session: Mobility/safety with mobility;Balance;Proper use of DME        AM-PAC PT "6 Clicks" Mobility   Outcome Measure  Help needed turning from your back to your side while in a flat bed without using bedrails?: None Help needed moving from lying on your back to sitting on the side of a flat bed without using bedrails?: A Little Help needed moving to and from a bed to a chair (including a wheelchair)?: A Little Help needed standing up from a chair using your arms (e.g., wheelchair or bedside chair)?: A  Little Help needed to walk in hospital room?: A Little Help needed climbing 3-5 steps with a railing? : A Lot 6 Click Score: 18    End of Session Equipment Utilized During Treatment: Gait belt Activity Tolerance: Patient tolerated treatment well Patient left: with call bell/phone within reach;in chair Nurse Communication: Mobility status PT Visit Diagnosis: Unsteadiness on feet (R26.81)     Time: 4360-6770 PT Time Calculation (min) (ACUTE ONLY): 23 min  Charges:  $Gait Training: 8-22 mins                     Ellamae Sia, PT, DPT [place] Pager [phone] Office [phone]    Willy Eddy 08/11/2018, 8:32 AM

## 2018-08-11 NOTE — Evaluation (Signed)
Occupational Therapy Evaluation Patient Details Name: Leah Howell MRN: [id number] DOB: 1945/11/19 Today's Date: 08/11/2018    History of Present Illness 73 year old female with history of metastatic lung CA transferred from [facility] to [facility] for a pericardial window due to pericardial tamponade.    Clinical Impression   PTA, pt was living with her husband and was independent with ADLs and light IADLs. Pt currently performing ADLs and functional mobility at [facility] A level with RW. Pt presenting with decreased activity tolerance as seen by decreased SpO2, fatigue, and SOB. Providing pt with handout and education on energy conservation for ADLs and light IADLs. Pt verbalized understanding and discussed ways to implement into her daily routine. Pt would benefit from further acute OT to facilitate safe dc. Recommend dc to home with HHOT for further OT to optimize safety, independence with ADLs, and return to PLOF.      Follow Up Recommendations  Home health OT;Supervision/Assistance - 24 hour    Equipment Recommendations  3 in 1 bedside commode;Tub/shower bench(RW and wheelchair)    Recommendations for Other Services PT consult     Precautions / Restrictions Precautions Precautions: Fall Precaution Comments: chest tube Restrictions Weight Bearing Restrictions: No      Mobility Bed Mobility Overal bed mobility: Modified Independent             General bed mobility comments: HOB elevated  Transfers Overall transfer level: Needs assistance Equipment used: Rolling walker (2 wheeled);4-wheeled walker Transfers: Sit to/from Stand Sit to Stand: Min guard         General transfer comment: Min Guard A for safety    Balance Overall balance assessment: Needs assistance Sitting-balance support: Feet supported Sitting balance-Leahy Scale: Good     Standing balance support: Bilateral upper extremity supported Standing balance-Leahy Scale: Poor Standing balance  comment: reliant on external support                           ADL either performed or assessed with clinical judgement   ADL Overall ADL's : Needs assistance/impaired Eating/Feeding: Independent   Grooming: Supervision/safety;Standing;Sitting   Upper Body Bathing: Supervision/ safety;Sitting   Lower Body Bathing: Min guard;Sit to/from stand   Upper Body Dressing : Supervision/safety;Sitting   Lower Body Dressing: Min guard;Sit to/from stand Lower Body Dressing Details (indicate cue type and reason): Educating pt on compensatory techniques for LB dressing to increase safety and decrease fatigue/sob Toilet Transfer: Min guard;RW;Ambulation(simulated to recliner)           Functional mobility during ADLs: Min guard;Rolling walker General ADL Comments: Pt performing ADLs at [employer] A level. Requiring increased time due to decreased activity tolerance. pt fatigues quickly and presents with SOB. Providing education and handout on EC to optimize pt safety and occuaptional performance at home. Pt verbalizing ways she can implement EC into her daily routine. Discussed use of tub bench for bathing and 3N1 as needed at night     Vision Baseline Vision/History: Wears glasses Wears Glasses: At all times Patient Visual Report: No change from baseline       Perception     Praxis      Pertinent Vitals/Pain Pain Assessment: Faces Faces Pain Scale: No hurt     Hand Dominance Right   Extremity/Trunk Assessment Upper Extremity Assessment Upper Extremity Assessment: Generalized weakness   Lower Extremity Assessment Lower Extremity Assessment: Generalized weakness   Cervical / Trunk Assessment Cervical / Trunk Assessment: Normal  Communication Communication Communication: No difficulties   Cognition Arousal/Alertness: Awake/alert Behavior During Therapy: WFL for tasks assessed/performed Overall Cognitive Status: Within Functional Limits for tasks  assessed                                     General Comments  SpO2 dropping to 82% on RA. Placing pt on 2L O2 and cued for purse lip breathing. SpO2 elevating to 94% on 2L    Exercises     Shoulder Instructions      Home Living Family/patient expects to be discharged to:: Private residence Living Arrangements: Spouse/significant other Available Help at Discharge: Family Type of Home: House Home Access: Level entry     Home Layout: One level     Bathroom Shower/Tub: Teacher, early years/pre: Standard     Home Equipment: None          Prior Functioning/Environment Level of Independence: Independent                 OT Problem List: Decreased strength;Decreased activity tolerance;Decreased range of motion;Impaired balance (sitting and/or standing);Decreased knowledge of use of DME or AE;Decreased knowledge of precautions      OT Treatment/Interventions: Self-care/ADL training;Therapeutic exercise;Energy conservation;DME and/or AE instruction;Therapeutic activities;Patient/family education    OT Goals(Current goals can be found in the care plan section) Acute Rehab OT Goals Patient Stated Goal: go home when ready OT Goal Formulation: With patient Time For Goal Achievement: 08/25/18 Potential to Achieve Goals: Good  OT Frequency: Min 2X/week   Barriers to D/C:            Co-evaluation PT/OT/SLP Co-Evaluation/Treatment: Yes Reason for Co-Treatment: For patient/therapist safety;Other (comment)(activity tolerance) PT goals addressed during session: Mobility/safety with mobility;Balance;Proper use of DME OT goals addressed during session: ADL's and self-care      AM-PAC OT "6 Clicks" Daily Activity     Outcome Measure Help from another person eating meals?: None Help from another person taking care of personal grooming?: None Help from another person toileting, which includes using toliet, bedpan, or urinal?: A Little Help from  another person bathing (including washing, rinsing, drying)?: A Little Help from another person to put on and taking off regular upper body clothing?: None Help from another person to put on and taking off regular lower body clothing?: A Little 6 Click Score: 21   End of Session Equipment Utilized During Treatment: Gait belt;Oxygen;Rolling walker Nurse Communication: Mobility status  Activity Tolerance: Patient tolerated treatment well Patient left: in chair;with call bell/phone within reach  OT Visit Diagnosis: Unsteadiness on feet (R26.81);Other abnormalities of gait and mobility (R26.89);Muscle weakness (generalized) (M62.81)                Time: 2376-2831 OT Time Calculation (min): 31 min Charges:  OT General Charges $OT Visit: 1 Visit OT Evaluation $OT Eval Moderate Complexity: [place], OTR/L Acute Rehab Pager: [phone] Office: [place] 08/11/2018, 10:13 AM

## 2018-08-11 NOTE — Plan of Care (Signed)
  Problem: Education: Goal: Knowledge of General Education information will improve Description Including pain rating scale, medication(s)/side effects and non-pharmacologic comfort measures Outcome: Progressing   Problem: Health Behavior/Discharge Planning: Goal: Ability to manage health-related needs will improve Outcome: Progressing   Problem: Clinical Measurements: Goal: Will remain free from infection Outcome: Progressing Goal: Diagnostic test results will improve Outcome: Progressing   Problem: Activity: Goal: Risk for activity intolerance will decrease Outcome: Progressing   Problem: Coping: Goal: Level of anxiety will decrease Outcome: Progressing   Problem: Elimination: Goal: Will not experience complications related to urinary retention Outcome: Progressing Note:  Pt has not made must urine today   Problem: Safety: Goal: Ability to remain free from injury will improve Outcome: Progressing   Problem: Skin Integrity: Goal: Risk for impaired skin integrity will decrease Outcome: Progressing   Problem: Education: Goal: Knowledge of the prescribed therapeutic regimen will improve Outcome: Progressing   Problem: Coping: Goal: Ability to identify and develop effective coping behavior will improve Outcome: Progressing   Problem: Clinical Measurements: Goal: Quality of life will improve Outcome: Progressing   Problem: Respiratory: Goal: Verbalizations of increased ease of respirations will increase Outcome: Progressing   Problem: Role Relationship: Goal: Family's ability to cope with current situation will improve Outcome: Progressing Goal: Ability to verbalize concerns, feelings, and thoughts to partner or family member will improve Outcome: Progressing   Problem: Pain Management: Goal: Satisfaction with pain management regimen will improve Outcome: Progressing Note:  Pt seems more comfortable with new medication regimen started today

## 2018-08-11 NOTE — Progress Notes (Signed)
  Palliative medicine progress note  Patient seen, chart reviewed.  Patient is up in the chair today.  Her breathing at rest is much improved.  Morphine dosing however does not seem to be adequate to address her chest wall pain as well as pain that she is describing as burning, between her shoulder blades. She did sleep some last night and is endorsing less anxiety Is hopeful to go home tomorrow with [facility].  I did speak with the case manager regarding what she would need at home: We are hopeful to talk patient into taking a hospital bed, oxygen, bedside commode, shower chair.  She will also need to stay on Lovenox for symptom management given her DVT and pain in her chest and arm.   She also is endorsing a sore throat; questionable thrush that I can't see further down her oral cavity, she is on nystatin  I updated her daughter as well as her husband via phone as to patient's current clinical condition and plans going forward  Plan  Home with hospice likely tomorrow morning.  Case manager has been notified and is beginning plans with [facility] including equipment: Hospital bed, oxygen, bedside commode, shower chair  Dyspnea is improving but does need up titration.  Patient states that the concentrate is very better so we will change to morphine IR 15 mg every 4 hours and continue with the concentrate 10-15 every 3 hours as needed for breakthrough shortness of breath.  Patient had been using oxycodone 10 mg, thus initial dosing of 5 mg every 4 hours was inadequate  Pain is improving but still needs up titration as noted above with changes to morphine.  She is also endorsing some burning quality to her back pain, will increase gabapentin to 200 mg 3 times a day.  Continue Lovenox for DVT related upper arm and chest wall pain  Constipation: Continue with Dulcolax and MiraLAX  Sore throat: Patient recently has had chemo and radiation, likely thrush further  down the esophagus; continue with nystatin swish and swallow and will add Diflucan 100 mg x 5 days  Disposition: Home with [facility] [place] likely on 2018-08-22.  Case management is aware  Thank you, Romona Curls, NP Time: 35 minutes Than 50% of time was spent in counseling and coordination of care.  Staffed with Marcene Brawn, bedside RN, and Dr. Thereasa Solo

## 2018-08-11 NOTE — Progress Notes (Signed)
CRITICAL VALUE ALERT  Critical Value:  WBC 1.2    Date & Time Notied:  08/11/2018 0500  Provider Notified: Dr Deterding  Orders Received/Actions taken: Neutropenic precautions

## 2018-08-11 NOTE — Plan of Care (Signed)
Patient states she is at peace and is looking forward to going home with Home Hospice. Leah Howell states her husband thinks she's giving up, but her daughter supports her decision.

## 2018-08-11 NOTE — Progress Notes (Signed)
[place] TEAM 1 - Stepdown/ICU TEAM  Leah Howell  HBZ:[id number] DOB: Oct 05, 1945 DOA: 08/07/2018 PCP: Tammi Sou, MD    Brief Narrative:  73 year old female with history of metastatic lung CA transferred from [facility] to [facility] for a pericardial window due to pericardial tamponade.   Significant Events: 3/25 admit [facility] - transfer to [facility]  3/26 pericardial window  Subjective: The patient is sitting up in a bedside chair.  She reports modest chest pain.  She denies fevers chills nausea or vomiting.  She does have some low back pain as well.  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 diagnosed in February 2020 - Palliative Care is following the patient with [place] -the patient has confirmed that she does not wish to consider any further chemotherapy or radiation therapy -plans are being made for her to be discharged home 3/30 with hospice assistance  Neutropenia Felt to be a stress-induced phenomenon in the setting of recent chemotherapy and active metastatic cancer leading to significant decreased production  Right IJ, subclavian, axillary vein DVT Heparin gtt to be transitioned to Lovenox today per pharmacy with intention to continue Lovenox at home as a comfort measure  Sinus Tachycardia  Due to above - no PE on CTangio - low dose BB as tolerated  COPD Lungs clear on exam today-continue Humibid -nebs PRN  CKD Stage 2 crt is stable   Protein Calorie  Malnutrition (mild / moderate) - Body mass index is 19.36 kg/m.   Disposition Plan for discharge home 3/30 if all hospice home care arrangements can be completed and if patient remains comfortable/clinically stable   DVT prophylaxis: heparin gtt  Code Status: DNR - NO CODE Family Communication: no family present at time of exam  Disposition Plan: Hopeful for discharge home with hospice care 3/30  Consultants:  Cardiology TCTS PCCM  Antimicrobials:  none  Objective: Blood pressure 110/74, pulse (!) 115, temperature 97.8 F (36.6 C), temperature source Oral, resp. rate 14, height 5\' 6"  (1.676 m), weight 54.4 kg, SpO2 96 %.  Intake/Output Summary (Last 24 hours) at 08/11/2018 0915 Last data filed at 08/11/2018 0500 Gross per 24 hour  Intake 539.95 ml  Output 500 ml  Net 39.95 ml   Filed Weights   08/08/18 0500 08/09/18 0500 08/11/18 0500  Weight: 55.3 kg 54.7 kg 54.4 kg    Examination: General: No respiratory distress this morning Lungs: Improved air movement throughout with no wheezing Cardiovascular: tachycardic - regular - no M  Abdomen: NT/ND, soft, bowel sounds positive Extremities: 2+ R UE edema, 1+ L UE edema without significant change -large Kerlix wrappings in place bilaterally  CBC: Recent Labs  Lab 08/05/18 1138 08/07/18 1147  08/09/18 0415 08/09/18 0435 08/10/18 0618 08/11/18 0423  WBC 5.2 10.3   < > 5.3  --  1.6* 1.2*  NEUTROABS 4.2 9.5*  --   --   --   --   --  HGB 14.0 13.4   < > 12.5 12.6 12.0 11.9*  HCT 42.4 40.4   < > 38.3 37.0 37.4 36.3  MCV 100.5* 100.5*   < > 97.7  --  98.9 98.4  PLT 194 193   < > 121*  --  110* 95*   < > = values in this interval not displayed.   Basic Metabolic Panel: Recent Labs  Lab 08/07/18 2253 08/09/18 0415 08/09/18 0435 08/10/18 0618  NA 130* 132* 131* 130*  K 4.8 4.1 4.4 4.1  CL 97* 100  --  95*  CO2 21* 23  --  25  GLUCOSE 130* 113*  --  101*  BUN 18 11  --  15  CREATININE 0.73 0.55  --  0.62   CALCIUM 8.7* 8.6*  --  8.4*  MG  --  1.7  --   --   PHOS  --  2.7  --   --    GFR: Estimated Creatinine Clearance: 55.4 mL/min (by C-G formula based on SCr of 0.62 mg/dL).  Liver Function Tests: Recent Labs  Lab 08/05/18 1138 08/07/18 1147 08/07/18 2253 08/10/18 0618  AST 21 33 27 22  ALT 20 31 26 17   ALKPHOS 91 68 58 54  BILITOT 0.8 0.8 0.9 0.5  PROT 6.9 6.1* 5.6* 5.1*  ALBUMIN 3.8 3.5 3.0* 2.6*    Coagulation Profile: Recent Labs  Lab 08/08/18 0506  INR 1.0    Cardiac Enzymes: Recent Labs  Lab 08/07/18 1150  TROPONINI <0.03     Recent Results (from the past 240 hour(s))  MRSA PCR Screening     Status: None   Collection Time: 08/07/18  7:49 PM  Result Value Ref Range Status   MRSA by PCR NEGATIVE NEGATIVE Final    Comment:        The GeneXpert MRSA Assay (FDA approved for NASAL specimens only), is one component of a comprehensive MRSA colonization surveillance program. It is not intended to diagnose MRSA infection nor to guide or monitor treatment for MRSA infections. Performed at [facility], [place] [address], [place], [place] [address]   Aerobic/Anaerobic Culture (surgical/deep wound)     Status: None (Preliminary result)   Collection Time: 08/08/18  8:52 AM  Result Value Ref Range Status   Specimen Description PERICARDIAL  Final   Special Requests SPECIMEN ON SWABS  Final   Gram Stain   Final    MODERATE WBC PRESENT, PREDOMINANTLY PMN NO ORGANISMS SEEN    Culture   Final    NO GROWTH 2 DAYS NO ANAEROBES ISOLATED; CULTURE IN PROGRESS FOR 5 DAYS Performed at [facility] [address] [place], [place] [address]    Report Status PENDING  Incomplete  Aerobic/Anaerobic Culture (surgical/deep wound)     Status: None (Preliminary result)   Collection Time: 08/08/18  8:58 AM  Result Value Ref Range Status   Specimen Description TISSUE PERICARDIAL  Final   Special Requests NONE  Final   Gram Stain NO WBC SEEN NO ORGANISMS SEEN    Final   Culture   Final    NO GROWTH 2 DAYS NO ANAEROBES ISOLATED; CULTURE IN PROGRESS FOR 5 DAYS Performed at [facility], [place] [address] [place], [place] [address]    Report Status PENDING  Incomplete  Acid Fast Smear (AFB)     Status: None   Collection Time: 08/08/18  8:58 AM  Result Value Ref Range Status   AFB Specimen Processing  Comment  Final    Comment: Tissue Grinding and Digestion/Decontamination   Acid Fast Smear Negative  Final    Comment: (NOTE) Performed At: [facility] [place], [place] [id number] Rush Farmer MD OO:[id number]    Source (AFB) TISSUE  Final    Comment: PERICARDIAL Performed at [facility], [place] [address] [place], [place] [address]      Scheduled Meds: . bisacodyl  10 mg Oral Daily  . feeding supplement (ENSURE ENLIVE)  237 mL Oral TID BM  . gabapentin  100 mg Oral TID  . guaiFENesin  600 mg Oral BID  . levalbuterol  0.63 mg Nebulization TID  . mouth rinse  15 mL Mouth Rinse BID  . metoprolol tartrate  12.5 mg Oral BID  . mometasone-formoterol  2 puff Inhalation BID  . morphine CONCENTRATE  5 mg Oral Q4H  . nystatin  5 mL Oral QID  . polyethylene glycol  17 g Oral Daily  . senna-docusate  1 tablet Oral QHS   Continuous Infusions: . heparin 900 Units/hr (08/11/18 0500)  . potassium chloride       LOS: 4 days   Leah Altes, MD [facility] Office  [phone] Pager - Text Page per Amion  If 7PM-7AM, please contact night-coverage per Amion 08/11/2018, 9:15 AM

## 2018-08-11 NOTE — Progress Notes (Addendum)
ANTICOAGULATION CONSULT NOTE - Follow up [place] for heparin Indication: DVT RUE  No Known Allergies  Patient Measurements: Height: 5\' 6"  (167.6 cm) Weight: 119 lb 14.9 oz (54.4 kg) IBW/kg (Calculated) : 59.3 Heparin Dosing Weight: 53kg  Vital Signs: Temp: 97.8 F (36.6 C) (03/29 0500) Temp Source: Oral (03/29 0500) BP: 122/61 (03/29 0700) Pulse Rate: 100 (03/29 0700)  Labs: Recent Labs    08/09/18 0415 08/09/18 0435  08/10/18 0618 08/11/18 0423 08/11/18 0735  HGB 12.5 12.6  --  12.0 11.9*  --   HCT 38.3 37.0  --  37.4 36.3  --   PLT 121*  --   --  110* 95*  --   HEPARINUNFRC  --   --    < > 0.23* 0.26* 0.25*  CREATININE 0.55  --   --  0.62  --   --    < > = values in this interval not displayed.    Estimated Creatinine Clearance: 55.4 mL/min (by C-G formula based on SCr of 0.62 mg/dL).   Medical History: Past Medical History:  Diagnosis Date  . Cancer of upper lobe of left lung ([place]) 06/2018   Bx+ carcinoma, PET + mediastinal mets, R lung mets, pelvic region bone mets. MRI brain pending as of 3/3/.  Pt to see Dr. Earlie Server approx 3/10 to discuss plan.  . Chronic renal insufficiency, stage 2 (mild)    GFR 60s  . COPD (chronic obstructive pulmonary disease) ([place])   . Hyperlipidemia    Mild--TLC  . Personal history of lung cancer 2008   2008; upper lobe R lung (Dr. Earlie Server).  Got chemo and rad.  Has been on observation and monitoring with annual CT's of chest since 2009.  2017 was "released" by onc.  06/2018->LUL mass;-->lung ca, +mets on PET scan.     Assessment: 70 yoF admitted with RUE found to have extensive DVT in setting of lung cancer. CT negative for PE, pt was started on IV heparin. Pt noted to have early tamponade and is now s/p subxiphoid pericardial window.   Heparin drip 900 uts/hr HL 0.25 within range of desired goal.  Pericardial tube removed.   Goal of Therapy:  Heparin level 0.2-0.3 units/ml Monitor platelets by  anticoagulation protocol: Yes   Plan:  Continue heparin 900 units/hr Daily HL, CBC  Erin Hearing PharmD., BCPS Clinical Pharmacist 08/11/2018 8:35 AM  Addendum:  Change to sq lovenox and stop heparin.  Will stop heparin and give sq lovenox 1mg /kg twice daily in 1 hour.  Check cbc in am then every 72 hours  Erin Hearing PharmD., BCPS Clinical Pharmacist 08/11/2018 9:55 AM

## 2018-08-12 ENCOUNTER — Telehealth: Payer: Self-pay | Admitting: Family Medicine

## 2018-08-12 ENCOUNTER — Ambulatory Visit: Payer: Medicare Other | Admitting: Internal Medicine

## 2018-08-12 ENCOUNTER — Other Ambulatory Visit: Payer: Medicare Other

## 2018-08-12 ENCOUNTER — Ambulatory Visit: Payer: Medicare Other

## 2018-08-12 MED ORDER — ALPRAZOLAM 0.5 MG PO TABS: ORAL_TABLET | ORAL | 0 refills | Status: DC

## 2018-08-12 MED ORDER — ENOXAPARIN SODIUM 60 MG/0.6ML ~~LOC~~ SOLN: 1.0000 mg/kg | Freq: Two times a day (BID) | SUBCUTANEOUS | 0 refills | Status: DC

## 2018-08-12 MED ORDER — MORPHINE SULFATE 15 MG PO TABS: 15.0000 mg | ORAL_TABLET | ORAL | 0 refills | Status: AC

## 2018-08-12 MED ORDER — ALPRAZOLAM 0.5 MG PO TABS: ORAL_TABLET | ORAL | 0 refills | Status: AC

## 2018-08-12 MED ORDER — POLYETHYLENE GLYCOL 3350 17 G PO PACK: 17.0000 g | PACK | Freq: Every day | ORAL | 0 refills | Status: AC

## 2018-08-12 MED ORDER — GABAPENTIN 100 MG PO CAPS: 200.0000 mg | ORAL_CAPSULE | Freq: Three times a day (TID) | ORAL | 0 refills | Status: AC

## 2018-08-12 MED ORDER — FLUCONAZOLE 100 MG PO TABS: 100.0000 mg | ORAL_TABLET | Freq: Every day | ORAL | 0 refills | Status: DC

## 2018-08-12 MED ORDER — GUAIFENESIN ER 600 MG PO TB12: 1200.0000 mg | ORAL_TABLET | Freq: Two times a day (BID) | ORAL | 0 refills | Status: DC

## 2018-08-12 MED ORDER — POLYETHYLENE GLYCOL 3350 17 G PO PACK: 17.0000 g | PACK | Freq: Every day | ORAL | 0 refills | Status: DC

## 2018-08-12 MED ORDER — METOPROLOL TARTRATE 25 MG PO TABS: 12.5000 mg | ORAL_TABLET | Freq: Two times a day (BID) | ORAL | 0 refills | Status: DC

## 2018-08-12 MED ORDER — FLUCONAZOLE 100 MG PO TABS: 100.0000 mg | ORAL_TABLET | Freq: Every day | ORAL | 0 refills | Status: AC

## 2018-08-12 MED ORDER — ACETAMINOPHEN 325 MG PO TABS: 650.0000 mg | ORAL_TABLET | Freq: Four times a day (QID) | ORAL | Status: AC | PRN

## 2018-08-12 MED ORDER — GABAPENTIN 100 MG PO CAPS: 200.0000 mg | ORAL_CAPSULE | Freq: Three times a day (TID) | ORAL | 0 refills | Status: DC

## 2018-08-12 MED ORDER — MORPHINE SULFATE (CONCENTRATE) 10 MG/0.5ML PO SOLN: 10.0000 mg | ORAL | 0 refills | Status: DC | PRN

## 2018-08-12 MED ORDER — MORPHINE SULFATE 15 MG PO TABS: 15.0000 mg | ORAL_TABLET | ORAL | 0 refills | Status: DC

## 2018-08-12 MED ORDER — ENOXAPARIN SODIUM 60 MG/0.6ML ~~LOC~~ SOLN: 1.0000 mg/kg | Freq: Two times a day (BID) | SUBCUTANEOUS | 0 refills | Status: AC

## 2018-08-12 MED ORDER — GUAIFENESIN ER 600 MG PO TB12: 1200.0000 mg | ORAL_TABLET | Freq: Two times a day (BID) | ORAL | 0 refills | Status: AC

## 2018-08-12 MED ORDER — BISACODYL 5 MG PO TBEC: 10.0000 mg | DELAYED_RELEASE_TABLET | Freq: Every day | ORAL | 0 refills | Status: AC

## 2018-08-12 MED ORDER — MORPHINE SULFATE (CONCENTRATE) 10 MG/0.5ML PO SOLN: 10.0000 mg | ORAL | 0 refills | Status: AC | PRN

## 2018-08-12 MED ORDER — METOPROLOL TARTRATE 25 MG PO TABS: 12.5000 mg | ORAL_TABLET | Freq: Two times a day (BID) | ORAL | 0 refills | Status: AC

## 2018-08-13 ENCOUNTER — Ambulatory Visit: Payer: Medicare Other

## 2018-08-13 LAB — AEROBIC/ANAEROBIC CULTURE W GRAM STAIN (SURGICAL/DEEP WOUND)
Culture: NO GROWTH
Culture: NO GROWTH
Gram Stain: NONE SEEN

## 2018-08-13 LAB — ACID FAST SMEAR (AFB, MYCOBACTERIA): Acid Fast Smear: NEGATIVE

## 2018-08-14 ENCOUNTER — Ambulatory Visit: Payer: Medicare Other

## 2018-08-14 ENCOUNTER — Telehealth: Payer: Self-pay

## 2018-08-14 NOTE — Progress Notes (Signed)
Pt discharged with PTAR to home.  All belongings sent home with pt including pt's ring, cell phone and charger.  Paperwork faxed to pharmacy and also given to [facility].

## 2018-08-14 NOTE — Telephone Encounter (Signed)
Yes I'll be "attending of record" and I'll sign the form. I defer any medication/lab orders and/or decisions in medical management to her hospice MD's. --thx

## 2018-08-14 NOTE — TOC Progression Note (Addendum)
Transition of Care [facility]) - Progression Note    Patient Details  Name: Leah Howell MRN: [id number] Date of Birth: 1946/03/13  Transition of Care [facility]) CM/SW [place] RN, BSN, NCM-BC, [place] [phone] Phone Number: 08/04/2018, 9:21 AM  Clinical Narrative:    CM spoke to Leah Howell [facility]) to discuss DME status. Leah Howell states the requested equipment had been ordered from [place] with delivery scheduled this morning. Patients Rx must be faxed to: [phone] for arrangement of patients Rx prior to transitioning home, with Dr. Thereasa Solo made aware. CM communicated the need for the hospice nurse to teach family about Lovenox injections during initial visit. PTAR will be arranged once its confirmed by the liaison all arrangements are complete; awaiting a return call with CM to continue to follow.   0945-Leah Howell RNCM-CM spoke to patients daughter, Leah Howell to discuss POC and to verify address for PTAR. Daughter communicated speaking to a [employer] rep, with DME to be delivered by 1100 today. CM will arrange PTAR for 1300 with patients daughter aware and agreeable.  Expected Discharge Plan: Home w Hospice Care Barriers to Discharge: No Barriers Identified  Expected Discharge Plan and Services Expected Discharge Plan: [place] In-house Referral: Hospice / Palliative Care Discharge Planning Services: CM Consult Post Acute Care Choice: Hospice Living arrangements for the past 2 months: Single Family Home Expected Discharge Date: (unknown)               DME Arranged: 3-N-1, Hospital bed, Oxygen, Walker rolling with seat, Wheelchair manual DME Agency: [facility] Determinants of Health (SDOH) Interventions    Readmission Risk Interventions Readmission Risk Prevention Plan 08/11/2018  Transportation Screening Complete  PCP or Specialist Appt within 5-7 Days  Complete  Home Care Screening Complete  Medication Review (RN CM) Complete  Some recent data might be hidden

## 2018-08-14 NOTE — Discharge Instructions (Signed)
Pericardial Effusion  Pericardial effusion is a buildup of fluid around the heart. The heart is surrounded by a thin, double-layered sac (pericardium). When fluid builds up in this sac, it can put pressure on the heart and cause problems. When fluid builds up in the pericardial sac and pressure on the heart increases, it becomes harder for the heart to pump blood. The fluid can prevent the heart from pumping enough blood (cardiac tamponade). This can be life-threatening. What are the causes? Often, the cause of pericardial effusion is not known (idiopathic effusion). In some cases, the condition may be caused by:  Infections from a virus, fungus, parasite, or bacteria.  Damage to the pericardium from heart surgery or a heart attack.  Inflammatory diseases, such as rheumatoid arthritis or lupus.  Kidney disease.  Thyroid disease.  Cancer or treatment for cancer, including radiation or chemotherapy.  Certain medicines, including medicines for tuberculosis or seizures.  Chest injury. What are the signs or symptoms? Pericardial effusion may not cause symptoms at first, especially if the fluid builds up slowly. In time, pressure on the heart may cause:  Chest pain.  Trouble with breathing.  Pain and shortness of breath that get worse when lying down.  Dizziness.  Fainting.  Cough.  Hiccups.  Skipped heartbeats (palpitations).  Anxiety and confusion.  A bluish skin color (cyanosis).  Swollen legs and ankles.  A feeling of fullness in the chest. How is this diagnosed? This condition is diagnosed based on your symptoms and testing, which may include:  A test that creates ultrasound images of your heart (echocardiogram).  A test to examine the electrical functions of your heart (electrocardiogram, ECG).  Chest X-ray.  CT scan.  MRI.  Blood tests. How is this treated? Treatment for this condition depends on the cause of your condition and how severe your symptoms  are. Treatment may include:  Medicines, such as: ? NSAIDs or other anti-inflammatory medicines such as steroids. ? Antibiotic medicine. ? Antifungal medicine.  Hospital treatment. This may be necessary for cardiac tamponade. Treatment in the hospital may include: ? IV fluids. ? Breathing support.  Surgery. This may be needed in severe cases. Surgery may include: ? A procedure to remove fluid from the pericardium by placing a needle into it (pericardiocentesis). ? A procedure to make a permanent opening in the pericardium (pericardial window). ? Open heart surgery. Follow these instructions at home:  Take over-the-counter and prescription medicines only as told by your health care provider.  If you were prescribed antibiotic medicine, take it as told by your health care provider. Do not stop taking the antibiotic even if you start to feel better.  Rest as told by your health care provider. Ask your health care provider what activities are safe for you.  Keep all follow-up visits as told by your health care provider. This is important. Contact a health care provider if:  You have a cough or hiccups that do not go away.  You have severe swelling in your legs or ankles. Get help right away if:  You have fast or irregular heartbeats (palpitations).  You feel dizzy or light-headed.  You faint.  You have chest pain.  You have trouble breathing. These symptoms may represent a serious problem that is an emergency. Do not wait to see if the symptoms will go away. Get medical help right away. Call your local emergency services (911 in the [place] Do not drive yourself to the hospital. Summary  Pericardial effusion is a buildup  of fluid around the heart. The fluid can eventually prevent the heart from pumping enough blood (cardiac tamponade), which can be life-threatening.  Pericardial effusion may not cause symptoms at first.  Treatment for pericardial effusion depends on the cause  of your condition and how severe your symptoms are. In severe cases, hospital treatment or surgery may be required.  Rest as told by your health care provider. Ask your health care provider what activities are safe for you. This information is not intended to replace advice given to you by your health care provider. Make sure you discuss any questions you have with your health care provider. Document Released: 12/27/2004 Document Revised: 06/08/2016 Document Reviewed: 06/08/2016 Elsevier Interactive Patient Education  2019 [employer].

## 2018-08-14 NOTE — Progress Notes (Addendum)
Physical Therapy Treatment Patient Details Name: Leah Howell MRN: [id number] DOB: 1945-06-17 Today's Date: Aug 14, 2018    History of Present Illness 73 year old female with history of metastatic lung CA transferred from [facility] to [facility] for a pericardial window due to pericardial tamponade.     PT Comments    Patient seen for OOB mobility. Patient requesting for OOB to chair. Continues to require some physical assist for balance and stability. Per nursing notes, plan is for home with hospice services. Equipment recommendations remain in place, will sign of from PT services at this time.    Follow Up Recommendations  Supervision/Assistance - 24 hour;Other (comment)(per notes, plan for home with hospice)     Equipment Recommendations  Rolling walker with 5" wheels;3in1 (PT);Wheelchair (measurements PT);Other (comment)(tub bench)    Recommendations for Other Services       Precautions / Restrictions Precautions Precautions: Fall Precaution Comments: chest tube Restrictions Weight Bearing Restrictions: No    Mobility  Bed Mobility Overal bed mobility: Modified Independent             General bed mobility comments: HOB elevated  Transfers Overall transfer level: Needs assistance Equipment used: 1 person hand held assist Transfers: Sit to/from Stand;Stand Pivot Transfers Sit to Stand: Min guard Stand pivot transfers: Min assist       General transfer comment: Min assist for stability during transition to chair  Ambulation/Gait                 Stairs             Wheelchair Mobility    Modified Rankin (Stroke Patients Only)       Balance Overall balance assessment: Needs assistance Sitting-balance support: Feet supported Sitting balance-Leahy Scale: Good     Standing balance support: Bilateral upper extremity supported Standing balance-Leahy Scale: Poor Standing balance comment: reliant on external support                             Cognition Arousal/Alertness: Awake/alert Behavior During Therapy: WFL for tasks assessed/performed Overall Cognitive Status: Within Functional Limits for tasks assessed                                        Exercises      General Comments        Pertinent Vitals/Pain Pain Assessment: Faces Faces Pain Scale: No hurt    Home Living                      Prior Function            PT Goals (current goals can now be found in the care plan section) Acute Rehab PT Goals Patient Stated Goal: go home when ready PT Goal Formulation: With patient Time For Goal Achievement: 08/23/18 Potential to Achieve Goals: Good Progress towards PT goals: Progressing toward goals    Frequency    Min 3X/week      PT Plan Equipment recommendations need to be updated    Co-evaluation              AM-PAC PT "6 Clicks" Mobility   Outcome Measure  Help needed turning from your back to your side while in a flat bed without using bedrails?: None Help needed moving from lying on your back to sitting on the side of  a flat bed without using bedrails?: A Little Help needed moving to and from a bed to a chair (including a wheelchair)?: A Little Help needed standing up from a chair using your arms (e.g., wheelchair or bedside chair)?: A Little Help needed to walk in hospital room?: A Little Help needed climbing 3-5 steps with a railing? : A Lot 6 Click Score: 18    End of Session Equipment Utilized During Treatment: Gait belt Activity Tolerance: Patient tolerated treatment well Patient left: with call bell/phone within reach;in chair Nurse Communication: Mobility status PT Visit Diagnosis: Unsteadiness on feet (R26.81)     Time: 1610-9604 PT Time Calculation (min) (ACUTE ONLY): 15 min  Charges:  $Therapeutic Activity: 8-22 mins                     Alben Deeds, PT DPT  Board Certified Neurologic Specialist [place] Pager  [phone] Office [phone]    Duncan Dull 2018/08/30, 9:11 AM

## 2018-08-14 NOTE — Telephone Encounter (Signed)
Received forms from Hospice. Placed on provider's desk to review and sign.

## 2018-08-14 NOTE — Telephone Encounter (Signed)
Cassandra at hospice advised.

## 2018-08-14 NOTE — Telephone Encounter (Signed)
Cassandra from [facility] called to ask if Dr Anitra Lauth would be willing to primary physician for Hospice services for pt. Cassandra also requested if Dr Anitra Lauth would be willing to sign a form regarding pt's life expectancy of 6 months or less.

## 2018-08-14 NOTE — Telephone Encounter (Signed)
Filled out form and placed on your desk.

## 2018-08-14 NOTE — Discharge Summary (Signed)
DISCHARGE SUMMARY  Leah Howell  MR#: [id number]  DOB:Feb 05, 1946  Date of Admission: 08/07/2018 Date of Discharge: 07/19/2018  Attending Physician:Shifa Brisbon Hennie Duos, MD  Patient's DQQ:[id number], Leah Blackwater, MD  Consults: Cardiology TCTS PCCM  Disposition: D/C home w/ hospice care   Follow-up Appts: Follow-up Information    McGowen, Leah Blackwater, MD Follow up.   Specialty:  Family Medicine Why:  Call as needed. The [facility] Team will assist you in knowing when to call your doctor.  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 diagnosed in February 2020 - Palliative Care is following the patient with Leah Howell -the patient has confirmed that she does not  wish to consider any further chemotherapy or radiation therapy -plans are being made for her to be discharged home 3/30 with hospice assistance  Neutropenia Felt to be a stress-induced phenomenon in the setting of recent chemotherapy and active metastatic cancer leading to significant decreased production  Right IJ, subclavian, axillary vein DVT Heparin gtt to be transitioned to Lovenox today per pharmacy with intention to continue Lovenox at home as a comfort measure  Sinus Tachycardia  Due to above - no PE on CTangio - low dose BB as tolerated  COPD Lungs clear on exam today-continue Humibid -nebs PRN  CKD Stage 2 crt is stable   Protein Calorie Malnutrition (mild / moderate) - Body mass index is 19.36 kg/m.   Allergies as of 13-Aug-2018   No Known Allergies     Medication List    STOP taking these medications   clonazePAM 0.5 MG tablet Commonly known as:  KLONOPIN   ibuprofen 200 MG tablet Commonly known as:  ADVIL,MOTRIN   methylPREDNISolone 4 MG Tbpk tablet Commonly known as:  MEDROL DOSEPAK     TAKE these medications   acetaminophen 325 MG tablet Commonly known as:  TYLENOL Take 2 tablets (650 mg total) by mouth every 6 (six) hours as needed for mild pain, fever or headache.   albuterol 1.25 MG/3ML nebulizer solution Commonly known as:  ACCUNEB Take 3 mLs (1.25 mg total) by nebulization every 6 (six) hours as needed. What changed:  Another medication  with the same name was removed. Continue taking this medication, and follow the directions you see here.   ALPRAZolam 0.5 MG tablet Commonly known as:  XANAX One tablet 3 times a day, and also at bedtime, as needed for anxiety or insomnia   bisacodyl 5 MG EC tablet Commonly known as:  DULCOLAX Take 2 tablets (10 mg total) by mouth daily.   budesonide-formoterol 160-4.5 MCG/ACT inhaler Commonly known as:  SYMBICORT Inhale 2 puffs into the lungs 2 (two) times daily.   enoxaparin 60 MG/0.6ML  injection Commonly known as:  LOVENOX Inject 0.55 mLs (55 mg total) into the skin 2 (two) times daily.   fluconazole 100 MG tablet Commonly known as:  DIFLUCAN Take 1 tablet (100 mg total) by mouth daily.   gabapentin 100 MG capsule Commonly known as:  NEURONTIN Take 2 capsules (200 mg total) by mouth 3 (three) times daily. What changed:  how much to take   guaiFENesin 600 MG 12 hr tablet Commonly known as:  MUCINEX Take 2 tablets (1,200 mg total) by mouth 2 (two) times daily. What changed:    how much to take  when to take this  reasons to take this   ipratropium 0.03 % nasal spray Commonly known as:  ATROVENT Place 2 sprays into both nostrils every 12 (twelve) hours. What changed:    when to take this  reasons to take this   metoprolol tartrate 25 MG tablet Commonly known as:  LOPRESSOR Take 0.5 tablets (12.5 mg total) by mouth 2 (two) times daily.   morphine 15 MG tablet Commonly known as:  MSIR Take 1 tablet (15 mg total) by mouth every 4 (four) hours.   morphine CONCENTRATE 10 MG/0.5ML Soln concentrated solution Take 0.5-0.75 mLs (10-15 mg total) by mouth every 3 (three) hours as needed for moderate pain or severe pain (air hunger).   polyethylene glycol packet Commonly known as:  MIRALAX / GLYCOLAX Take 17 g by mouth daily.   prochlorperazine 5 MG tablet Commonly known as:  COMPAZINE 1 to 2 tablets PO QID prn nausea. What changed:    how much to take  how to take this  when to take this  reasons to take this   traMADol 50 MG tablet Commonly known as:  ULTRAM Take 1 tablet (50 mg total) by mouth every 6 (six) hours as needed for moderate pain.       Day of Discharge BP 119/67 (BP Location: Left Arm)    Pulse 100    Temp (!) 97.5 F (36.4 C) (Oral)    Resp 18    Ht 5\' 6"  (1.676 m)    Wt 52.4 kg    SpO2 94%    BMI 18.65 kg/m   Physical Exam: General: No acute respiratory distress Lungs: course crackles scattered throughout B fields   Cardiovascular: Regular rate and rhythm without murmur  Abdomen: NT/ND, soft, bs+, no mass  Extremities: trace B LE edema  Basic Metabolic Panel: Recent Labs  Lab 08/05/18 1138 08/07/18 1147 08/07/18 2253 08/09/18 0415 08/09/18 0435 08/10/18 0618  NA 134* 130* 130* 132* 131* 130*  K 3.9 4.5 4.8 4.1 4.4 4.1  CL 98 95* 97* 100  --  95*  CO2 21* 24 21* 23  --  25  GLUCOSE 96 130* 130* 113*  --  101*  BUN 9 24* 18 11  --  15  CREATININE 0.75 0.80 0.73 0.55  --  0.62  CALCIUM 9.3 8.8* 8.7* 8.6*  --  8.4*  MG  --   --   --  1.7  --   --   PHOS  --   --   --  2.7  --   --     Liver Function Tests: Recent Labs  Lab 08/05/18 1138 08/07/18 1147 08/07/18 2253 08/10/18 0618  AST 21 33 27 22  ALT 20 31 26 17   ALKPHOS 91 68 58 54  BILITOT 0.8 0.8 0.9 0.5  PROT 6.9 6.1* 5.6* 5.1*  ALBUMIN 3.8 3.5 3.0* 2.6*    Coags: Recent Labs  Lab 08/08/18 0506  INR 1.0    CBC: Recent Labs  Lab 08/05/18 1138  08/07/18 1147 08/07/18 2253 08/08/18 0506 08/09/18 0415 08/09/18 0435 08/10/18 0618 08/11/18 0423  WBC 5.2   < > 10.3 7.8 6.9 5.3  --  1.6* 1.2*  NEUTROABS 4.2  --  9.5*  --   --   --   --   --   --   HGB 14.0   < > 13.4 13.3 12.7 12.5 12.6 12.0 11.9*  HCT 42.4  --  40.4 39.5 39.0 38.3 37.0 37.4 36.3  MCV 100.5*  --  100.5* 99.2 99.0 97.7  --  98.9 98.4  PLT 194   < > 193 139* 159 121*  --  110* 95*   < > = values in this interval not displayed.   Cardiac Enzymes: Recent Labs  Lab 08/07/18 1150  TROPONINI <0.03    Recent Results (from the past 240 hour(s))  MRSA PCR Screening     Status: None   Collection Time: 08/07/18  7:49 PM  Result Value Ref Range Status   MRSA by PCR NEGATIVE NEGATIVE Final    Comment:        The GeneXpert MRSA Assay (FDA approved for NASAL specimens only), is one component of a comprehensive MRSA colonization surveillance program. It is not intended to diagnose MRSA infection nor to guide or monitor treatment for MRSA  infections. Performed at [facility], [place] [employer] [address], [place], [place] [address]   Aerobic/Anaerobic Culture (surgical/deep wound)     Status: None (Preliminary result)   Collection Time: 08/08/18  8:52 AM  Result Value Ref Range Status   Specimen Description PERICARDIAL  Final   Special Requests SPECIMEN ON SWABS  Final   Gram Stain   Final    MODERATE WBC PRESENT, PREDOMINANTLY PMN NO ORGANISMS SEEN    Culture   Final    NO GROWTH 3 DAYS NO ANAEROBES ISOLATED; CULTURE IN PROGRESS FOR 5 DAYS Performed at [place] [address], [place], [place] [address]    Report Status PENDING  Incomplete  Aerobic/Anaerobic Culture (surgical/deep wound)     Status: None (Preliminary result)   Collection Time: 08/08/18  8:58 AM  Result Value Ref Range Status   Specimen Description TISSUE PERICARDIAL  Final   Special Requests NONE  Final   Gram Stain NO WBC SEEN NO ORGANISMS SEEN   Final   Culture   Final    NO GROWTH 3 DAYS NO ANAEROBES ISOLATED; CULTURE IN PROGRESS FOR 5 DAYS Performed at [facility], [place] [address], [place], [place] [address]    Report Status PENDING  Incomplete  Acid Fast Smear (AFB)     Status: None   Collection Time: 08/08/18  8:58 AM  Result Value Ref Range Status   AFB Specimen Processing Comment  Final    Comment: Tissue Grinding and  Digestion/Decontamination   Acid Fast Smear Negative  Final    Comment: (NOTE) Performed At: [facility] [place], [place] [id number] Rush Farmer MD ZS:[id number]    Source (AFB) TISSUE  Final    Comment: PERICARDIAL Performed at [facility], [place] [address] [place], [place] [address]      Time spent in discharge (includes decision making & examination of pt): 35 minutes  09/11/18, 9:31 AM   Cherene Altes, MD [facility] Office  [phone]

## 2018-08-14 NOTE — Progress Notes (Signed)
OT Cancellation Note  Patient Details Name: STEFFANY SCHOENFELDER MRN: [id number] DOB: [birth year]   Cancelled Treatment:    Reason Eval/Treat Not Completed: Other (comment)(cancelled by MD, pt going home with hospice ).   Delight Stare, OT Acute Rehabilitation Services Pager [phone] Office [phone]    Delight Stare September 01, 2018, 10:22 AM

## 2018-08-14 DEATH — deceased

## 2018-08-15 ENCOUNTER — Ambulatory Visit: Payer: Medicare Other

## 2018-08-15 ENCOUNTER — Telehealth: Payer: Self-pay | Admitting: Medical Oncology

## 2018-08-15 NOTE — Telephone Encounter (Signed)
Forms faxed by front staff earlier this morning.

## 2018-08-16 ENCOUNTER — Encounter: Payer: Self-pay | Admitting: Pharmacy Technician

## 2018-08-16 ENCOUNTER — Ambulatory Visit: Payer: Medicare Other

## 2018-08-16 NOTE — Telephone Encounter (Signed)
Spoke to daughter -expressed our sympathy.

## 2018-08-16 NOTE — Progress Notes (Signed)
The patient is approved for drug assistance by [employer] for [employer]. Enrollment is effective until 08/13/19 and is based on insurance denial. Drug replacement will begin on DOS 08/27/18.

## 2018-08-19 ENCOUNTER — Ambulatory Visit: Payer: Medicare Other

## 2018-08-19 ENCOUNTER — Inpatient Hospital Stay: Payer: Medicare Other | Attending: Internal Medicine

## 2018-08-20 ENCOUNTER — Ambulatory Visit: Payer: Medicare Other

## 2018-08-21 ENCOUNTER — Ambulatory Visit: Payer: Medicare Other

## 2018-08-22 ENCOUNTER — Ambulatory Visit: Payer: Medicare Other

## 2018-08-27 ENCOUNTER — Ambulatory Visit: Payer: Medicare Other

## 2018-08-27 ENCOUNTER — Other Ambulatory Visit: Payer: Medicare Other

## 2018-08-27 ENCOUNTER — Ambulatory Visit: Payer: Medicare Other | Admitting: Internal Medicine

## 2018-08-28 ENCOUNTER — Telehealth: Payer: Self-pay | Admitting: Family Medicine

## 2018-08-28 NOTE — Telephone Encounter (Signed)
[place] dropped off clean copy of death certificate. They made an error on the original copy. Please complete new certificate and mail back.

## 2018-08-29 ENCOUNTER — Encounter: Payer: Self-pay | Admitting: Radiation Oncology

## 2018-08-29 ENCOUNTER — Ambulatory Visit: Payer: Medicare Other

## 2018-08-29 ENCOUNTER — Ambulatory Visit: Payer: Medicare Other | Admitting: Family Medicine

## 2018-08-29 NOTE — Telephone Encounter (Signed)
New form mailed

## 2018-08-29 NOTE — Telephone Encounter (Signed)
Completed and put in bin to go up front.

## 2018-08-29 NOTE — Telephone Encounter (Signed)
FYI

## 2018-08-29 NOTE — Progress Notes (Signed)
  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  Plan: The patient passed away after discontinuing radiation. ________________________________  Blair Promise, PhD, MD  This document serves as a record of services personally performed by Gery Pray, MD. It was created on his behalf by Rae Lips, a trained medical scribe. The creation of this record is based on the scribe's personal observations and the provider's statements to them. This document has been checked and approved by the attending provider.

## 2018-09-02 ENCOUNTER — Other Ambulatory Visit: Payer: Medicare Other

## 2018-09-09 ENCOUNTER — Other Ambulatory Visit: Payer: Medicare Other

## 2018-09-09 LAB — FUNGUS CULTURE RESULT

## 2018-09-09 LAB — FUNGUS CULTURE WITH STAIN

## 2018-09-09 LAB — FUNGAL ORGANISM REFLEX

## 2018-09-16 ENCOUNTER — Ambulatory Visit: Payer: Medicare Other | Admitting: Physician Assistant

## 2018-09-16 ENCOUNTER — Ambulatory Visit: Payer: Medicare Other

## 2018-09-16 ENCOUNTER — Other Ambulatory Visit: Payer: Medicare Other

## 2018-09-18 ENCOUNTER — Ambulatory Visit: Payer: Medicare Other

## 2018-09-20 LAB — ACID FAST CULTURE WITH REFLEXED SENSITIVITIES (MYCOBACTERIA): Acid Fast Culture: NEGATIVE

## 2018-09-23 ENCOUNTER — Other Ambulatory Visit: Payer: Medicare Other

## 2018-09-25 LAB — ACID FAST CULTURE WITH REFLEXED SENSITIVITIES (MYCOBACTERIA): Acid Fast Culture: NEGATIVE

## 2018-09-30 ENCOUNTER — Other Ambulatory Visit: Payer: Medicare Other

## 2018-10-08 ENCOUNTER — Ambulatory Visit: Payer: Medicare Other

## 2018-10-08 ENCOUNTER — Ambulatory Visit: Payer: Medicare Other | Admitting: Physician Assistant

## 2018-10-08 ENCOUNTER — Other Ambulatory Visit: Payer: Medicare Other

## 2018-10-10 ENCOUNTER — Ambulatory Visit: Payer: Medicare Other

## 2020-03-27 IMAGING — CT NM PET TUM IMG RESTAG (PS) SKULL BASE T - THIGH
1 of 8 series · 1 of 25 positions shown · non-contrast
Comparison: CT on 06/18/2018

CLINICAL DATA: Initial treatment strategy for left upper lobe lung
mass. Personal history of right lung carcinoma.

EXAM:
NUCLEAR MEDICINE PET SKULL BASE TO THIGH
TECHNIQUE: 5.9 mCi F-18 FDG was injected intravenously. Full-ring PET imaging
was performed from the skull base to thigh after the radiotracer. CT
data was obtained and used for attenuation correction and anatomic
localization.
Fasting blood glucose: 101 mg/dl

[Series 4: ct sk_thigh 5.0 b31f · axial · 5.0mm · 0.98mm/px · 1 of 210 slices shown]
[im 210/210  brain]
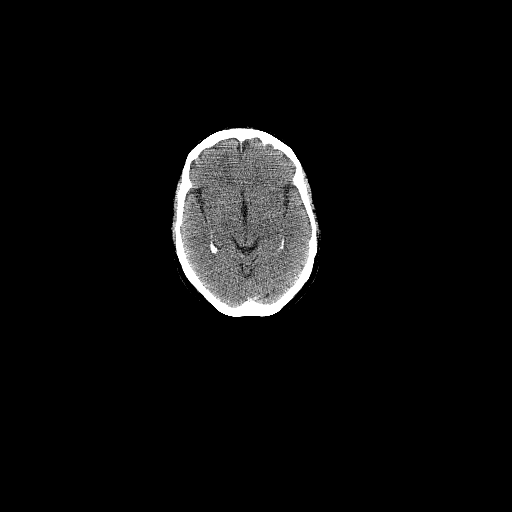

[1 of 25 positions shown; findings below may reference images not displayed]

FINDINGS: (Background mediastinal blood pool activity: SUV max = 2.2)

NECK:  No hypermetabolic lymph nodes or masses.

Incidental CT findings:  None.

CHEST: Smoothly marginated mass is seen involving the left upper
lobe and left anterior chest wall at the anterior 1st intercostal
space. This measures 5.0 x 3.6 cm and has SUV max of 18.4.

In addition, there is abnormal soft tissue density in the anterior
mediastinum and chest wall with destruction of the sternal
manubrium. In aggregate, this measures 5.1 x 2.8 cm on image 57/4,
and has SUV max of 14.7.

Smoothly marginated hypermetabolic mass or lymphadenopathy is also
seen in the left anterior cardiophrenic angle, which measures 4.2 x
3.1 cm on image 88/4, and has SUV max of 14.3. A small left pleural
effusion is noted.

Two pulmonary nodules are also seen in the posterior right lower
lobe which are hypermetabolic, with SUV max up to 2.2. These are
consistent with pulmonary metastases.

Incidental CT findings: Stable postop changes seen from previous
right upper lobectomy.

ABDOMEN/PELVIS: No abnormal hypermetabolic activity within the
liver, pancreas, adrenal glands, or spleen. No hypermetabolic lymph
nodes in the abdomen or pelvis.

Incidental CT findings:  None.

SKELETON: Several hypermetabolic bone lesions are seen right ilium,
right sacrum, and right ischial tuberosity, consistent with bone
metastases. Largest metastatic lesion in the right ischial
tuberosity has an SUV max of 4.7.

Incidental CT findings:  None.
IMPRESSION: 5 cm hypermetabolic smoothly marginated mass involving the anterior
left upper lobe and left chest wall. This could represent a primary
bronchogenic carcinoma or metastatic disease.

Hypermetabolic anterior mediastinal and chest wall soft tissue
density causing destruction of the sternal manubrium, consistent
with metastatic disease.

4 cm hypermetabolic mediastinal mass in the left cardiophrenic
angle, consistent with metastatic disease. Small left pleural
effusion may be malignant in etiology.

Two sub-cm hypermetabolic pulmonary metastases in the right lower
lobe.

Hypermetabolic bone metastases involving the sternum and right
pelvis.
